# Patient Record
Sex: Female | Born: 1940 | Race: White | Hispanic: No | Marital: Married | Smoking: Never smoker
Health system: Southern US, Community
[De-identification: ages and names within clinical notes are randomized; demographics above are authoritative.]

## PROBLEM LIST (undated history)

## (undated) DIAGNOSIS — M159 Polyosteoarthritis, unspecified: Secondary | ICD-10-CM

## (undated) DIAGNOSIS — M810 Age-related osteoporosis without current pathological fracture: Secondary | ICD-10-CM

## (undated) DIAGNOSIS — D6481 Anemia due to antineoplastic chemotherapy: Secondary | ICD-10-CM

## (undated) DIAGNOSIS — I10 Essential (primary) hypertension: Secondary | ICD-10-CM

## (undated) DIAGNOSIS — G43909 Migraine, unspecified, not intractable, without status migrainosus: Secondary | ICD-10-CM

## (undated) DIAGNOSIS — I639 Cerebral infarction, unspecified: Secondary | ICD-10-CM

## (undated) DIAGNOSIS — E039 Hypothyroidism, unspecified: Secondary | ICD-10-CM

## (undated) DIAGNOSIS — K8020 Calculus of gallbladder without cholecystitis without obstruction: Secondary | ICD-10-CM

## (undated) DIAGNOSIS — F419 Anxiety disorder, unspecified: Secondary | ICD-10-CM

## (undated) DIAGNOSIS — K219 Gastro-esophageal reflux disease without esophagitis: Secondary | ICD-10-CM

## (undated) DIAGNOSIS — C801 Malignant (primary) neoplasm, unspecified: Secondary | ICD-10-CM

## (undated) DIAGNOSIS — D649 Anemia, unspecified: Secondary | ICD-10-CM

## (undated) DIAGNOSIS — E119 Type 2 diabetes mellitus without complications: Secondary | ICD-10-CM

## (undated) DIAGNOSIS — T451X5A Adverse effect of antineoplastic and immunosuppressive drugs, initial encounter: Secondary | ICD-10-CM

## (undated) DIAGNOSIS — G459 Transient cerebral ischemic attack, unspecified: Secondary | ICD-10-CM

## (undated) DIAGNOSIS — E7800 Pure hypercholesterolemia, unspecified: Secondary | ICD-10-CM

## (undated) HISTORY — PX: EYE SURGERY: SHX253

## (undated) HISTORY — PX: RETINAL DETACHMENT SURGERY: SHX105

## (undated) HISTORY — PX: TONSILLECTOMY: SUR1361

## (undated) HISTORY — PX: CATARACT EXTRACTION: SUR2

## (undated) HISTORY — PX: DILATION AND CURETTAGE OF UTERUS: SHX78

## (undated) HISTORY — PX: BREAST BIOPSY: SHX20

---

## 2004-02-27 ENCOUNTER — Other Ambulatory Visit: Payer: Self-pay

## 2005-05-31 ENCOUNTER — Ambulatory Visit: Payer: Self-pay | Admitting: Unknown Physician Specialty

## 2005-07-19 ENCOUNTER — Ambulatory Visit: Payer: Self-pay | Admitting: Unknown Physician Specialty

## 2006-07-11 ENCOUNTER — Emergency Department: Payer: Self-pay | Admitting: Unknown Physician Specialty

## 2006-07-11 ENCOUNTER — Other Ambulatory Visit: Payer: Self-pay

## 2008-07-01 ENCOUNTER — Ambulatory Visit: Payer: Self-pay

## 2009-01-07 ENCOUNTER — Ambulatory Visit: Payer: Self-pay | Admitting: Internal Medicine

## 2009-06-28 ENCOUNTER — Ambulatory Visit: Payer: Self-pay

## 2009-07-11 ENCOUNTER — Ambulatory Visit: Payer: Self-pay

## 2010-08-31 ENCOUNTER — Ambulatory Visit: Payer: Self-pay | Admitting: Physician Assistant

## 2010-10-03 ENCOUNTER — Ambulatory Visit: Payer: Self-pay | Admitting: Unknown Physician Specialty

## 2010-10-05 LAB — PATHOLOGY REPORT

## 2011-09-20 ENCOUNTER — Ambulatory Visit: Payer: Self-pay | Admitting: *Deleted

## 2011-11-13 ENCOUNTER — Ambulatory Visit: Payer: Self-pay | Admitting: Ophthalmology

## 2011-11-21 ENCOUNTER — Ambulatory Visit: Payer: Self-pay | Admitting: Ophthalmology

## 2012-04-05 ENCOUNTER — Ambulatory Visit: Payer: Self-pay | Admitting: Internal Medicine

## 2012-09-23 ENCOUNTER — Ambulatory Visit: Payer: Self-pay | Admitting: Family Medicine

## 2012-10-01 ENCOUNTER — Ambulatory Visit: Payer: Self-pay | Admitting: Ophthalmology

## 2013-09-30 ENCOUNTER — Ambulatory Visit: Payer: Self-pay | Admitting: Family Medicine

## 2014-05-21 ENCOUNTER — Ambulatory Visit: Payer: Self-pay | Admitting: Unknown Physician Specialty

## 2014-06-11 ENCOUNTER — Ambulatory Visit: Payer: Self-pay | Admitting: Unknown Physician Specialty

## 2014-10-05 ENCOUNTER — Ambulatory Visit: Payer: Self-pay | Admitting: Family Medicine

## 2014-11-21 NOTE — Op Note (Signed)
PATIENT NAME:  Erica Levy, Erica Levy MR#:  [id number] DATE OF BIRTH:  08/08/1940  DATE OF PROCEDURE:  11/21/2011  PROCEDURES PERFORMED:  1. Pars plana vitrectomy of the right eye.  2. Internal limiting membrane peel of the right eye.  3. Gas exchange of the right eye.   PREOPERATIVE DIAGNOSIS: Macular hole.   POSTOPERATIVE DIAGNOSIS: Macular hole.   ESTIMATED BLOOD LOSS: Less than 1 mL.   PRIMARY SURGEON: Teresa Pelton. Naw Lasala, MD    ANESTHESIA: Retrobulbar block of the right eye with monitored anesthesia care.   COMPLICATIONS: None.   INDICATIONS FOR PROCEDURE: This is a patient who presented to my office with decreasing vision in the right eye. 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 Risks, benefits, and alternatives of the above procedure were discussed and the patient wished to proceed.   DETAILS OF PROCEDURE: After informed consent was obtained, the patient was brought into the operative suite at [facility]. The patient was placed in supine position and was given a small dose of propofol and a retrobulbar block was performed on the right eye by the primary surgeon without any complications. The right eye was prepped and draped in sterile manner. After lid speculum was inserted, a 25-gauge trocar was placed inferotemporally through displaced conjunctiva in an oblique fashion 4 mm beyond the limbus. Infusion cannula was turned on and inserted through the trocar and secured in position with Steri-Strips. Two more trocars were placed in a similar fashion superotemporally and superonasally. The vitreous cutter and light pipe were introduced into the eye and a core vitrectomy was performed. The vitreous face was elevated off of the retina and the peripheral vitreous was trimmed for 360 degrees. Indocyanine green was injected onto the posterior pole and removed within 30 seconds. An internal limiting membrane peel was performed for 360 degrees around the  fovea for a total diameter of two disk diameters. A scleral depressed exam was performed for 360 degrees and no signs of any breaks, tears, or retinal detachment could be identified. A complete air-fluid exchange was performed. Four minutes was allowed to elapse for dehydration of the vitreous base. This remnant fluid was removed. 22% SF6 was used as an Acupuncturist. All the trocars were removed and the wounds were noted to be airtight. The eye was confirmed to be approximately 15 mmHg. 5 mg of dexamethasone was given into the inferior fornix and the lid speculum was removed. The eye was cleaned and TobraDex was placed in the eye. A patch and shield was placed over the eye and the patient was taken to postanesthesia care with instructions to remain face down.    ____________________________ Teresa Pelton. Starling Manns, MD [id number] D: 11/21/2011 08:01:52 ET T: 11/21/2011 09:26:52 ET JOB#: [id number]  cc: Teresa Pelton. Starling Manns, MD, <Dictator> Coralee Rud MD ELECTRONICALLY SIGNED 12/05/2011 7:10

## 2014-11-22 LAB — SURGICAL PATHOLOGY

## 2015-02-09 ENCOUNTER — Inpatient Hospital Stay: Payer: Medicare Other

## 2015-02-09 ENCOUNTER — Ambulatory Visit (INDEPENDENT_AMBULATORY_CARE_PROVIDER_SITE_OTHER)
Admission: EM | Admit: 2015-02-09 | Discharge: 2015-02-09 | Disposition: A | Discharge status: Home / Self Care | Payer: Medicare Other | Source: Home / Self Care | Attending: Family Medicine | Admitting: Family Medicine

## 2015-02-09 ENCOUNTER — Inpatient Hospital Stay
Admission: EM | Admit: 2015-02-09 | Discharge: 2015-02-10 | DRG: 066 | Disposition: A | Discharge status: Home / Self Care | Payer: Medicare Other | Attending: Internal Medicine | Admitting: Internal Medicine

## 2015-02-09 ENCOUNTER — Encounter: Payer: Self-pay | Admitting: Registered Nurse

## 2015-02-09 ENCOUNTER — Inpatient Hospital Stay (HOSPITAL_COMMUNITY)
Admit: 2015-02-09 | Discharge: 2015-02-09 | Disposition: A | Discharge status: Home / Self Care | Payer: Medicare Other | Attending: Internal Medicine | Admitting: Internal Medicine

## 2015-02-09 ENCOUNTER — Emergency Department: Payer: Medicare Other

## 2015-02-09 ENCOUNTER — Encounter: Payer: Self-pay | Admitting: Emergency Medicine

## 2015-02-09 DIAGNOSIS — I639 Cerebral infarction, unspecified: Secondary | ICD-10-CM

## 2015-02-09 DIAGNOSIS — H539 Unspecified visual disturbance: Secondary | ICD-10-CM | POA: Diagnosis not present

## 2015-02-09 DIAGNOSIS — E785 Hyperlipidemia, unspecified: Secondary | ICD-10-CM | POA: Diagnosis present

## 2015-02-09 DIAGNOSIS — I959 Hypotension, unspecified: Secondary | ICD-10-CM | POA: Diagnosis present

## 2015-02-09 DIAGNOSIS — E039 Hypothyroidism, unspecified: Secondary | ICD-10-CM | POA: Diagnosis present

## 2015-02-09 DIAGNOSIS — Z79899 Other long term (current) drug therapy: Secondary | ICD-10-CM

## 2015-02-09 DIAGNOSIS — G839 Paralytic syndrome, unspecified: Secondary | ICD-10-CM

## 2015-02-09 DIAGNOSIS — M6289 Other specified disorders of muscle: Secondary | ICD-10-CM

## 2015-02-09 DIAGNOSIS — K219 Gastro-esophageal reflux disease without esophagitis: Secondary | ICD-10-CM | POA: Diagnosis present

## 2015-02-09 DIAGNOSIS — Z9849 Cataract extraction status, unspecified eye: Secondary | ICD-10-CM | POA: Diagnosis not present

## 2015-02-09 DIAGNOSIS — I10 Essential (primary) hypertension: Secondary | ICD-10-CM | POA: Diagnosis present

## 2015-02-09 DIAGNOSIS — E780 Pure hypercholesterolemia: Secondary | ICD-10-CM | POA: Diagnosis present

## 2015-02-09 DIAGNOSIS — Z8249 Family history of ischemic heart disease and other diseases of the circulatory system: Secondary | ICD-10-CM | POA: Diagnosis not present

## 2015-02-09 DIAGNOSIS — R279 Unspecified lack of coordination: Secondary | ICD-10-CM | POA: Diagnosis present

## 2015-02-09 DIAGNOSIS — R29898 Other symptoms and signs involving the musculoskeletal system: Secondary | ICD-10-CM

## 2015-02-09 DIAGNOSIS — Z9889 Other specified postprocedural states: Secondary | ICD-10-CM

## 2015-02-09 DIAGNOSIS — I63411 Cerebral infarction due to embolism of right middle cerebral artery: Secondary | ICD-10-CM | POA: Diagnosis not present

## 2015-02-09 DIAGNOSIS — E119 Type 2 diabetes mellitus without complications: Secondary | ICD-10-CM | POA: Diagnosis present

## 2015-02-09 DIAGNOSIS — Z820 Family history of epilepsy and other diseases of the nervous system: Secondary | ICD-10-CM | POA: Diagnosis not present

## 2015-02-09 DIAGNOSIS — M159 Polyosteoarthritis, unspecified: Secondary | ICD-10-CM | POA: Diagnosis present

## 2015-02-09 DIAGNOSIS — G43909 Migraine, unspecified, not intractable, without status migrainosus: Secondary | ICD-10-CM | POA: Diagnosis present

## 2015-02-09 DIAGNOSIS — M810 Age-related osteoporosis without current pathological fracture: Secondary | ICD-10-CM | POA: Diagnosis present

## 2015-02-09 HISTORY — DX: Gastro-esophageal reflux disease without esophagitis: K21.9

## 2015-02-09 HISTORY — DX: Age-related osteoporosis without current pathological fracture: M81.0

## 2015-02-09 HISTORY — DX: Pure hypercholesterolemia, unspecified: E78.00

## 2015-02-09 HISTORY — DX: Hypothyroidism, unspecified: E03.9

## 2015-02-09 HISTORY — DX: Essential (primary) hypertension: I10

## 2015-02-09 HISTORY — DX: Polyosteoarthritis, unspecified: M15.9

## 2015-02-09 HISTORY — DX: Migraine, unspecified, not intractable, without status migrainosus: G43.909

## 2015-02-09 LAB — BASIC METABOLIC PANEL
Anion gap: 4 — ABNORMAL LOW (ref 5–15)
BUN: 28 mg/dL — ABNORMAL HIGH (ref 6–20)
CALCIUM: 9 mg/dL (ref 8.9–10.3)
CHLORIDE: 107 mmol/L (ref 101–111)
CO2: 27 mmol/L (ref 22–32)
CREATININE: 1.09 mg/dL — AB (ref 0.44–1.00)
GFR calc Af Amer: 57 mL/min — ABNORMAL LOW (ref >60)
GFR, EST NON AFRICAN AMERICAN: 49 mL/min — AB (ref >60)
GLUCOSE: 143 mg/dL — AB (ref 65–99)
Potassium: 4 mmol/L (ref 3.5–5.1)
Sodium: 138 mmol/L (ref 135–145)

## 2015-02-09 LAB — CBC
HEMATOCRIT: 40.4 % (ref 35.0–47.0)
Hemoglobin: 14 g/dL (ref 12.0–16.0)
MCH: 31.1 pg (ref 26.0–34.0)
MCHC: 34.8 g/dL (ref 32.0–36.0)
MCV: 89.5 fL (ref 80.0–100.0)
PLATELETS: 214 10*3/uL (ref 150–440)
RBC: 4.51 MIL/uL (ref 3.80–5.20)
RDW: 13.1 % (ref 11.5–14.5)
WBC: 5.4 10*3/uL (ref 3.6–11.0)

## 2015-02-09 LAB — APTT: aPTT: 30 seconds (ref 24–36)

## 2015-02-09 LAB — PROTIME-INR
INR: 0.9
Prothrombin Time: 12.4 seconds (ref 11.4–15.0)

## 2015-02-09 LAB — HEMOGLOBIN A1C: Hgb A1c MFr Bld: 5.4 % (ref 4.0–6.0)

## 2015-02-09 LAB — TROPONIN I: Troponin I: <0.03 ng/mL (ref <0.031)

## 2015-02-09 LAB — TSH: TSH: 2.346 u[IU]/mL (ref 0.350–4.500)

## 2015-02-09 MED ORDER — HYDROCHLOROTHIAZIDE 25 MG PO TABS
25.0000 mg | ORAL_TABLET | Freq: Every morning | ORAL | Status: DC
  Administered 2015-02-09 – 2015-02-10 (×2): 25 mg via ORAL
  Filled 2015-02-09 (×3): qty 1

## 2015-02-09 MED ORDER — ENOXAPARIN SODIUM 40 MG/0.4ML ~~LOC~~ SOLN
40.0000 mg | SUBCUTANEOUS | Status: DC
  Administered 2015-02-09 – 2015-02-10 (×2): 40 mg via SUBCUTANEOUS
  Filled 2015-02-09 (×2): qty 0.4

## 2015-02-09 MED ORDER — ACETAMINOPHEN 650 MG RE SUPP
650.0000 mg | Freq: Four times a day (QID) | RECTAL | Status: DC | PRN
Start: 2015-02-09 — End: 2015-02-10

## 2015-02-09 MED ORDER — ACETAMINOPHEN 325 MG PO TABS
650.0000 mg | ORAL_TABLET | Freq: Four times a day (QID) | ORAL | Status: DC | PRN
  Administered 2015-02-09: 650 mg via ORAL
  Filled 2015-02-09: qty 2

## 2015-02-09 MED ORDER — VITAMIN D (ERGOCALCIFEROL) 1.25 MG (50000 UNIT) PO CAPS
50000.0000 [IU] | ORAL_CAPSULE | ORAL | Status: DC
  Administered 2015-02-09: 50000 [IU] via ORAL
  Filled 2015-02-09: qty 1

## 2015-02-09 MED ORDER — ASPIRIN 81 MG PO CHEW
324.0000 mg | CHEWABLE_TABLET | Freq: Once | ORAL | Status: AC
  Administered 2015-02-09: 324 mg via ORAL
  Filled 2015-02-09: qty 4

## 2015-02-09 MED ORDER — ONDANSETRON HCL 4 MG PO TABS: 4.0000 mg | ORAL_TABLET | Freq: Four times a day (QID) | ORAL | Status: DC | PRN

## 2015-02-09 MED ORDER — LISINOPRIL 20 MG PO TABS
20.0000 mg | ORAL_TABLET | Freq: Every morning | ORAL | Status: DC
  Administered 2015-02-09 – 2015-02-10 (×2): 20 mg via ORAL
  Filled 2015-02-09 (×2): qty 1

## 2015-02-09 MED ORDER — ASPIRIN EC 81 MG PO TBEC
81.0000 mg | DELAYED_RELEASE_TABLET | Freq: Every day | ORAL | Status: DC
  Administered 2015-02-10: 08:00:00 81 mg via ORAL
  Filled 2015-02-09: qty 1

## 2015-02-09 MED ORDER — LEVOTHYROXINE SODIUM 50 MCG PO TABS
50.0000 ug | ORAL_TABLET | Freq: Every day | ORAL | Status: DC
  Administered 2015-02-10: 08:00:00 50 ug via ORAL
  Filled 2015-02-09: qty 1

## 2015-02-09 MED ORDER — SODIUM CHLORIDE 0.9 % IJ SOLN
3.0000 mL | Freq: Two times a day (BID) | INTRAMUSCULAR | Status: DC
  Administered 2015-02-09 – 2015-02-10 (×3): 3 mL via INTRAVENOUS

## 2015-02-09 MED ORDER — ONDANSETRON HCL 4 MG/2ML IJ SOLN: 4.0000 mg | Freq: Four times a day (QID) | INTRAMUSCULAR | Status: DC | PRN

## 2015-02-09 MED ORDER — PRAVASTATIN SODIUM 20 MG PO TABS
80.0000 mg | ORAL_TABLET | Freq: Every day | ORAL | Status: DC
  Administered 2015-02-09 – 2015-02-10 (×2): 80 mg via ORAL
  Filled 2015-02-09 (×2): qty 4

## 2015-02-09 NOTE — Plan of Care (Signed)
Problem: Discharge/Transitional Outcomes Goal: Educational Plan Complete Pt admitted from ED-from home with husband Left hand tingling at 0700 7/13 Hx of HTN, high cholesterol, diabetes controlled with home meds

## 2015-02-09 NOTE — Discharge Instructions (Signed)
Visual Disturbances You have had a disturbance in your vision. This may be caused by various conditions, such as:  Migraines. Migraine headaches are often preceded by a disturbance in vision. Blind spots or light flashes are followed by a headache. This type of visual disturbance is temporary. It does not damage the eye.  Glaucoma. This is caused by increased pressure in the eye. Symptoms include haziness, blurred vision, or seeing rainbow colored circles when looking at bright lights. Partial or complete visual loss can occur. You may or may not experience eye pain. Visual loss may be gradual or sudden and is irreversible. Glaucoma is the leading cause of blindness.  Retina problems. Vision will be reduced if the retina becomes detached or if there is a circulation problem as with diabetes, high blood pressure, or a mini-stroke. Symptoms include seeing "floaters," flashes of light, or shadows, as if a curtain has fallen over your eye.  Optic nerve problems. The main nerve in your eye can be damaged by redness, soreness, and swelling (inflammation), poor circulation, drugs, and toxins. It is very important to have a complete exam done by a specialist to determine the exact cause of your eye problem. The specialist may recommend medicines or surgery, depending on the cause of the problem. This can help prevent further loss of vision or reduce the risk of having a stroke. Contact the caregiver to whom you have been referred and arrange for follow-up care right away. SEEK IMMEDIATE MEDICAL CARE IF:   Your vision gets worse.  You develop severe headaches.  You have any weakness or numbness in the face, arms, or legs.  You have any trouble speaking or walking. Document Released: 08/23/2004 Document Revised: 10/08/2011 Document Reviewed: 12/14/2009 [facility] Patient Information 2015 [place], [place]. This information is not intended to replace advice given to you by your health care provider. Make sure  you discuss any questions you have with your health care provider. Migraine Headache A migraine headache is an intense, throbbing pain on one or both sides of your head. A migraine can last for 30 minutes to several hours. CAUSES  The exact cause of a migraine headache is not always known. However, a migraine may be caused when nerves in the brain become irritated and release chemicals that cause inflammation. This causes pain. Certain things may also trigger migraines, such as:  Alcohol.  Smoking.  Stress.  Menstruation.  Aged cheeses.  Foods or drinks that contain nitrates, glutamate, aspartame, or tyramine.  Lack of sleep.  Chocolate.  Caffeine.  Hunger.  Physical exertion.  Fatigue.  Medicines used to treat chest pain (nitroglycerine), birth control pills, estrogen, and some blood pressure medicines. SIGNS AND SYMPTOMS  Pain on one or both sides of your head.  Pulsating or throbbing pain.  Severe pain that prevents daily activities.  Pain that is aggravated by any physical activity.  Nausea, vomiting, or both.  Dizziness.  Pain with exposure to bright lights, loud noises, or activity.  General sensitivity to bright lights, loud noises, or smells. Before you get a migraine, you may get warning signs that a migraine is coming (aura). An aura may include:  Seeing flashing lights.  Seeing bright spots, halos, or zigzag lines.  Having tunnel vision or blurred vision.  Having feelings of numbness or tingling.  Having trouble talking.  Having muscle weakness. DIAGNOSIS  A migraine headache is often diagnosed based on:  Symptoms.  Physical exam.  A CT scan or MRI of your head. These imaging tests  cannot diagnose migraines, but they can help rule out other causes of headaches. TREATMENT Medicines may be given for pain and nausea. Medicines can also be given to help prevent recurrent migraines.  HOME CARE INSTRUCTIONS  Only take over-the-counter or  prescription medicines for pain or discomfort as directed by your health care provider. The use of long-term narcotics is not recommended.  Lie down in a dark, quiet room when you have a migraine.  Keep a journal to find out what may trigger your migraine headaches. For example, write down:  What you eat and drink.  How much sleep you get.  Any change to your diet or medicines.  Limit alcohol consumption.  Quit smoking if you smoke.  Get 7-9 hours of sleep, or as recommended by your health care provider.  Limit stress.  Keep lights dim if bright lights bother you and make your migraines worse. SEEK IMMEDIATE MEDICAL CARE IF:   Your migraine becomes severe.  You have a fever.  You have a stiff neck.  You have vision loss.  You have muscular weakness or loss of muscle control.  You start losing your balance or have trouble walking.  You feel faint or pass out.  You have severe symptoms that are different from your first symptoms. MAKE SURE YOU:   Understand these instructions.  Will watch your condition.  Will get help right away if you are not doing well or get worse. Document Released: 07/16/2005 Document Revised: 11/30/2013 Document Reviewed: 03/23/2013 [facility] Patient Information 2015 [place], [place]. This information is not intended to replace advice given to you by your health care provider. Make sure you discuss any questions you have with your health care provider. Ischemic Stroke A stroke (cerebrovascular accident) is the sudden death of brain tissue. It is a medical emergency. A stroke can cause permanent loss of brain function. This can cause problems with different parts of your body. A transient ischemic attack (TIA) is different because it does not cause permanent damage. A TIA is a short-lived problem of poor blood flow affecting a part of the brain. A TIA is also a serious problem because having a TIA greatly increases the chances of having a stroke.  When symptoms first develop, you cannot know if the problem might be a stroke or a TIA. CAUSES  A stroke is caused by a decrease of oxygen supply to an area of your brain. It is usually the result of a small blood clot or collection of cholesterol or fat (plaque) that blocks blood flow in the brain. A stroke can also be caused by blocked or damaged carotid arteries.  RISK FACTORS  High blood pressure (hypertension).  High cholesterol.  Diabetes mellitus.  Heart disease.  The buildup of plaque in the blood vessels (peripheral artery disease or atherosclerosis).  The buildup of plaque in the blood vessels providing blood and oxygen to the brain (carotid artery stenosis).  An abnormal heart rhythm (atrial fibrillation).  Obesity.  Smoking.  Taking oral contraceptives (especially in combination with smoking).  Physical inactivity.  A diet high in fats, salt (sodium), and calories.  Alcohol use.  Use of illegal drugs (especially cocaine and methamphetamine).  Being African American.  Being over the age of 66.  Family history of stroke.  Previous history of blood clots, stroke, TIA, or heart attack.  Sickle cell disease. SYMPTOMS  These symptoms usually develop suddenly, or may be newly present upon awakening from sleep:  Sudden weakness or numbness of the face,  arm, or leg, especially on one side of the body.  Sudden trouble walking or difficulty moving arms or legs.  Sudden confusion.  Sudden personality changes.  Trouble speaking (aphasia) or understanding.  Difficulty swallowing.  Sudden trouble seeing in one or both eyes.  Double vision.  Dizziness.  Loss of balance or coordination.  Sudden severe headache with no known cause.  Trouble reading or writing. DIAGNOSIS  Your health care provider can often determine the presence or absence of a stroke based on your symptoms, history, and physical exam. Computed tomography (CT) of the brain is usually  performed to confirm the stroke, determine causes, and determine stroke severity. Other tests may be done to find the cause of the stroke. These tests may include:  Electrocardiography.  Continuous heart monitoring.  Echocardiography.  Carotid ultrasonography.  Magnetic resonance imaging (MRI).  A scan of the brain circulation.  Blood tests. PREVENTION  The risk of a stroke can be decreased by appropriately treating high blood pressure, high cholesterol, diabetes, heart disease, and obesity and by quitting smoking, limiting alcohol, and staying physically active. TREATMENT  Time is of the essence. It is important to seek treatment at the first sign of these symptoms because you may receive a medicine to dissolve the clot (thrombolytic) that cannot be given if too much time has passed since your symptoms began. Even if you do not know when your symptoms began, get treatment as soon as possible as there are other treatment options available including oxygen, intravenous (IV) fluids, and medicines to thin the blood (anticoagulants). Treatment of stroke depends on the duration, severity, and cause of your symptoms. Medicines and dietary changes may be used to address diabetes, high blood pressure, and other risk factors. Physical, speech, and occupational therapists will assess you and work with you to improve any functions impaired by the stroke. Measures will be taken to prevent short-term and long-term complications, including infection from breathing foreign material into the lungs (aspiration pneumonia), blood clots in the legs, bedsores, and falls. Rarely, surgery may be needed to remove large blood clots or to open up blocked arteries. HOME CARE INSTRUCTIONS   Take medicines only as directed by your health care provider. Follow the directions carefully. Medicines may be used to control risk factors for a stroke. Be sure you understand all your medicine instructions.  You may be told to take  a medicine to thin the blood, such as aspirin or the anticoagulant warfarin. Warfarin needs to be taken exactly as instructed.  Too much and too little warfarin are both dangerous. Too much warfarin increases the risk of bleeding. Too little warfarin continues to allow the risk for blood clots. While taking warfarin, you will need to have regular blood tests to measure your blood clotting time. These blood tests usually include both the PT and INR tests. The PT and INR results allow your health care provider to adjust your dose of warfarin. The dose can change for many reasons. It is critically important that you take warfarin exactly as prescribed, and that you have your PT and INR levels drawn exactly as directed.  Many foods, especially foods high in vitamin K, can interfere with warfarin and affect the PT and INR results. Foods high in vitamin K include spinach, kale, broccoli, cabbage, collard and turnip greens, brussels sprouts, peas, cauliflower, seaweed, and parsley, as well as beef and pork liver, green tea, and soybean oil. You should eat a consistent amount of foods high in vitamin K. Avoid  major changes in your diet, or notify your health care provider before changing your diet. Arrange a visit with a dietitian to answer your questions.  Many medicines can interfere with warfarin and affect the PT and INR results. You must tell your health care provider about any and all medicines you take. This includes all vitamins and supplements. Be especially cautious with aspirin and anti-inflammatory medicines. Do not take or discontinue any prescribed or over-the-counter medicine except on the advice of your health care provider or pharmacist.  Warfarin can have side effects, such as excessive bruising or bleeding. You will need to hold pressure over cuts for longer than usual. Your health care provider or pharmacist will discuss other potential side effects.  Avoid sports or activities that may cause  injury or bleeding.  Be mindful when shaving, flossing your teeth, or handling sharp objects.  Alcohol can change the body's ability to handle warfarin. It is best to avoid alcoholic drinks or consume only very small amounts while taking warfarin. Notify your health care provider if you change your alcohol intake.  Notify your dentist or other health care providers before procedures.  If swallow studies have determined that your swallowing reflex is present, you should eat healthy foods. Including 5 or more servings of fruits and vegetables a day may reduce the risk of stroke. Foods may need to be a certain consistency (soft or pureed), or small bites may need to be taken in order to avoid aspirating or choking. Certain dietary changes may be advised to address high blood pressure, high cholesterol, diabetes, or obesity.  Food choices that are low in sodium, saturated fat, trans fat, and cholesterol are recommended to manage high blood pressure.  Food choies that are high in fiber, and low in saturated fat, trans fat, and cholesterol may control cholesterol levels.  Controlling carbohydrates and sugar intake is recommended to manage diabetes.  Reducing calorie intake and making food choices that are low in sodium, saturated fat, trans fat, and cholesterol are recommended to manage obesity.  Maintain a healthy weight.  Stay physically active. It is recommended that you get at least 30 minutes of activity on all or most days.  Do not use any tobacco products including cigarettes, chewing tobacco, or electronic cigarettes.  Limit alcohol use even if you are not taking warfarin. Moderate alcohol use is considered to be:  No more than 2 drinks each day for men.  No more than 1 drink each day for nonpregnant women.  Home safety. A safe home environment is important to reduce the risk of falls. Your health care provider may arrange for specialists to evaluate your home. Having grab bars in the  bedroom and bathroom is often important. Your health care provider may arrange for equipment to be used at home, such as raised toilets and a seat for the shower.  Physical, occupational, and speech therapy. Ongoing therapy may be needed to maximize your recovery after a stroke. If you have been advised to use a walker or a cane, use it at all times. Be sure to keep your therapy appointments.  Follow all instructions for follow-up with your health care provider. This is very important. This includes any referrals, physical therapy, rehabilitation, and lab tests. Proper follow-up can prevent another stroke from occurring. SEEK MEDICAL CARE IF:  You have personality changes.  You have difficulty swallowing.  You are seeing double.  You have dizziness.  You have a fever.  You have skin breakdown. SEEK IMMEDIATE  MEDICAL CARE IF:  Any of these symptoms may represent a serious problem that is an emergency. Do not wait to see if the symptoms will go away. Get medical help right away. Call your local emergency services (911 in [place] Do not drive yourself to the hospital.  You have sudden weakness or numbness of the face, arm, or leg, especially on one side of the body.  You have sudden trouble walking or difficulty moving arms or legs.  You have sudden confusion.  You have trouble speaking (aphasia) or understanding.  You have sudden trouble seeing in one or both eyes.  You have a loss of balance or coordination.  You have a sudden, severe headache with no known cause.  You have new chest pain or an irregular heartbeat.  You have a partial or total loss of consciousness. Document Released: 07/16/2005 Document Revised: 11/30/2013 Document Reviewed: 02/24/2012 [facility] Patient Information 2015 [place], [place]. This information is not intended to replace advice given to you by your health care provider. Make sure you discuss any questions you have with your health care  provider. Transient Ischemic Attack A transient ischemic attack (TIA) is a "warning stroke" that causes stroke-like symptoms. Unlike a stroke, a TIA does not cause permanent damage to the brain. The symptoms of a TIA can happen very fast and do not last long. It is important to know the symptoms of a TIA and what to do. This can help prevent a major stroke or death. CAUSES   A TIA is caused by a temporary blockage in an artery in the brain or neck (carotid artery). The blockage does not allow the brain to get the blood supply it needs and can cause different symptoms. The blockage can be caused by either:  A blood clot.  Fatty buildup (plaque) in a neck or brain artery. RISK FACTORS  High blood pressure (hypertension).  High cholesterol.  Diabetes mellitus.  Heart disease.  The build up of plaque in the blood vessels (peripheral artery disease or atherosclerosis).  The build up of plaque in the blood vessels providing blood and oxygen to the brain (carotid artery stenosis).  An abnormal heart rhythm (atrial fibrillation).  Obesity.  Smoking.  Taking oral contraceptives (especially in combination with smoking).  Physical inactivity.  A diet high in fats, salt (sodium), and calories.  Alcohol use.  Use of illegal drugs (especially cocaine and methamphetamine).  Being female.  Being African American.  Being over the age of 32.  Family history of stroke.  Previous history of blood clots, stroke, TIA, or heart attack.  Sickle cell disease. SYMPTOMS  TIA symptoms are the same as a stroke but are temporary. These symptoms usually develop suddenly, or may be newly present upon awakening from sleep:  Sudden weakness or numbness of the face, arm, or leg, especially on one side of the body.  Sudden trouble walking or difficulty moving arms or legs.  Sudden confusion.  Sudden personality changes.  Trouble speaking (aphasia) or understanding.  Difficulty  swallowing.  Sudden trouble seeing in one or both eyes.  Double vision.  Dizziness.  Loss of balance or coordination.  Sudden severe headache with no known cause.  Trouble reading or writing.  Loss of bowel or bladder control.  Loss of consciousness. DIAGNOSIS  Your caregiver may be able to determine the presence or absence of a TIA based on your symptoms, history, and physical exam. Computed tomography (CT scan) of the brain is usually performed to help identify a  TIA. Other tests may be done to diagnose a TIA. These tests may include:  Electrocardiography.  Continuous heart monitoring.  Echocardiography.  Carotid ultrasonography.  Magnetic resonance imaging (MRI).  A scan of the brain circulation.  Blood tests. PREVENTION  The risk of a TIA can be decreased by appropriately treating high blood pressure, high cholesterol, diabetes, heart disease, and obesity and by quitting smoking, limiting alcohol, and staying physically active. TREATMENT  Time is of the essence. Since the symptoms of TIA are the same as a stroke, it is important to seek treatment as soon as possible because you may need a medicine to dissolve the clot (thrombolytic) that cannot be given if too much time has passed. Treatment options vary. Treatment options may include rest, oxygen, intravenous (IV) fluids, and medicines to thin the blood (anticoagulants). Medicines and diet may be used to address diabetes, high blood pressure, and other risk factors. Measures will be taken to prevent short-term and long-term complications, including infection from breathing foreign material into the lungs (aspiration pneumonia), blood clots in the legs, and falls. Treatment options include procedures to either remove plaque in the carotid arteries or dilate carotid arteries that have narrowed due to plaque. Those procedures are:  Carotid endarterectomy.  Carotid angioplasty and stenting. HOME CARE INSTRUCTIONS   Take  all medicines prescribed by your caregiver. Follow the directions carefully. Medicines may be used to control risk factors for a stroke. Be sure you understand all your medicine instructions.  You may be told to take aspirin or the anticoagulant warfarin. Warfarin needs to be taken exactly as instructed.  Taking too much or too little warfarin is dangerous. Too much warfarin increases the risk of bleeding. Too little warfarin continues to allow the risk for blood clots. While taking warfarin, you will need to have regular blood tests to measure your blood clotting time. A PT blood test measures how long it takes for blood to clot. Your PT is used to calculate another value called an INR. Your PT and INR help your caregiver to adjust your dose of warfarin. The dose can change for many reasons. It is critically important that you take warfarin exactly as prescribed.  Many foods, especially foods high in vitamin K can interfere with warfarin and affect the PT and INR. Foods high in vitamin K include spinach, kale, broccoli, cabbage, collard and turnip greens, brussels sprouts, peas, cauliflower, seaweed, and parsley as well as beef and pork liver, green tea, and soybean oil. You should eat a consistent amount of foods high in vitamin K. Avoid major changes in your diet, or notify your caregiver before changing your diet. Arrange a visit with a dietitian to answer your questions.  Many medicines can interfere with warfarin and affect the PT and INR. You must tell your caregiver about any and all medicines you take, this includes all vitamins and supplements. Be especially cautious with aspirin and anti-inflammatory medicines. Do not take or discontinue any prescribed or over-the-counter medicine except on the advice of your caregiver or pharmacist.  Warfarin can have side effects, such as excessive bruising or bleeding. You will need to hold pressure over cuts for longer than usual. Your caregiver or  pharmacist will discuss other potential side effects.  Avoid sports or activities that may cause injury or bleeding.  Be mindful when shaving, flossing your teeth, or handling sharp objects.  Alcohol can change the body's ability to handle warfarin. It is best to avoid alcoholic drinks or consume only very  small amounts while taking warfarin. Notify your caregiver if you change your alcohol intake.  Notify your dentist or other caregivers before procedures.  Eat a diet that includes 5 or more servings of fruits and vegetables each day. This may reduce the risk of stroke. Certain diets may be prescribed to address high blood pressure, high cholesterol, diabetes, or obesity.  A low-sodium, low-saturated fat, low-trans fat, low-cholesterol diet is recommended to manage high blood pressure.  A low-saturated fat, low-trans fat, low-cholesterol, and high-fiber diet may control cholesterol levels.  A controlled-carbohydrate, controlled-sugar diet is recommended to manage diabetes.  A reduced-calorie, low-sodium, low-saturated fat, low-trans fat, low-cholesterol diet is recommended to manage obesity.  Maintain a healthy weight.  Stay physically active. It is recommended that you get at least 30 minutes of activity on most or all days.  Do not smoke.  Limit alcohol use even if you are not taking warfarin. Moderate alcohol use is considered to be:  No more than 2 drinks each day for men.  No more than 1 drink each day for nonpregnant women.  Stop drug abuse.  Home safety. A safe home environment is important to reduce the risk of falls. Your caregiver may arrange for specialists to evaluate your home. Having grab bars in the bedroom and bathroom is often important. Your caregiver may arrange for equipment to be used at home, such as raised toilets and a seat for the shower.  Follow all instructions for follow-up with your caregiver. This is very important. This includes any referrals and  lab tests. Proper follow up can prevent a stroke or another TIA from occurring. SEEK MEDICAL CARE IF:  You have personality changes.  You have difficulty swallowing.  You are seeing double.  You have dizziness.  You have a fever.  You have skin breakdown. SEEK IMMEDIATE MEDICAL CARE IF:  Any of these symptoms may represent a serious problem that is an emergency. Do not wait to see if the symptoms will go away. Get medical help right away. Call your local emergency services (911 in [place] Do not drive yourself to the hospital.  You have sudden weakness or numbness of the face, arm, or leg, especially on one side of the body.  You have sudden trouble walking or difficulty moving arms or legs.  You have sudden confusion.  You have trouble speaking (aphasia) or understanding.  You have sudden trouble seeing in one or both eyes.  You have a loss of balance or coordination.  You have a sudden, severe headache with no known cause.  You have new chest pain or an irregular heartbeat.  You have a partial or total loss of consciousness. MAKE SURE YOU:   Understand these instructions.  Will watch your condition.  Will get help right away if you are not doing well or get worse. Document Released: 04/25/2005 Document Revised: 07/21/2013 Document Reviewed: 10/21/2013 [facility] Patient Information 2015 [place], [place]. This information is not intended to replace advice given to you by your health care provider. Make sure you discuss any questions you have with your health care provider.

## 2015-02-09 NOTE — ED Provider Notes (Signed)
[facility] Emergency Department Provider Note  ____________________________________________  Time seen: Approximately 10:25 AM  I have reviewed the triage vital signs and the nursing notes.   HISTORY  Chief Complaint Hand Pain    HPI Erica Levy is a 74 y.o. female history of migraines who presents this morning noticing that she could not move her left hand and having some numbness. She also reported a slight vision change and a moderate headache that last about an hour and is now gone. She does report similar headaches with migraines and occasionally gets numbness in the left hand but is never had weakness in her left arm.  She went to bed about 11 PM last night and woke up with symptoms at 7:30 AM. Symptoms did not start suddenly once she was awake, but she awoke with them.  Denies any chest pain or trouble breathing. No previous history of stroke.  No neck pain.  Past Medical History  Diagnosis Date  . Hypertension   . Hypothyroid   . Hypercholesteremia   . DM (diabetes mellitus)     diet controlled  . Generalized OA   . Osteoporosis   . Migraine   . GERD (gastroesophageal reflux disease)     Patient Active Problem List   Diagnosis Date Noted  . CVA (cerebral infarction) 02/09/2015    Past Surgical History  Procedure Laterality Date  . Tonsillectomy    . Eye surgery    . Cataract extraction    . Retinal detachment surgery      No current outpatient prescriptions on file.  Allergies Review of patient's allergies indicates no known allergies.  Family History  Problem Relation Age of Onset  . Hypertension Mother   . Osteoarthritis Mother   . Dementia Father     Social History History  Substance Use Topics  . Smoking status: Never Smoker   . Smokeless tobacco: Not on file  . Alcohol Use: No    Review of Systems Constitutional: No fever/chills Eyes: No visual changes. ENT: No sore throat. Cardiovascular:  Denies chest pain. Respiratory: Denies shortness of breath. Gastrointestinal: No abdominal pain.  No nausea, no vomiting.  No diarrhea.  No constipation. Genitourinary: Negative for dysuria. Musculoskeletal: Negative for back pain. Skin: Negative for rash. Neurological: Negative for headaches, no weakness in the face, no trouble speaking, no weakness in the legs or trouble walking. 10-point ROS otherwise negative.  ____________________________________________   PHYSICAL EXAM:  VITAL SIGNS: ED Triage Vitals  Enc Vitals Group     BP 02/09/15 0942 115/56 mmHg     Pulse Rate 02/09/15 0942 94     Resp 02/09/15 0942 18     Temp 02/09/15 0942 98.1 F (36.7 C)     Temp Source 02/09/15 0942 Oral     SpO2 02/09/15 0942 99 %     Weight 02/09/15 0942 159 lb (72.122 kg)     Height 02/09/15 0942 5\' 1"  (1.549 m)     Head Cir --      Peak Flow --      Pain Score --      Pain Loc --      Pain Edu? --      Excl. in [place]? --     Constitutional: Alert and oriented. Well appearing and in no acute distress. Eyes: Conjunctivae are normal. PERRL. EOMI. Head: Atraumatic. Nose: No congestion/rhinnorhea. Mouth/Throat: Mucous membranes are moist.  Oropharynx non-erythematous. Neck: No stridor.   Cardiovascular: Normal rate, regular rhythm. Grossly  normal heart sounds.  Good peripheral circulation. Respiratory: Normal respiratory effort.  No retractions. Lungs CTAB. Gastrointestinal: Soft and nontender. No distention. No abdominal bruits. No CVA tenderness. Musculoskeletal: No lower extremity tenderness nor edema.  No joint effusions. Neurologic:  Normal speech and language. No gross focal neurologic deficits are appreciated except in the left hand. No gait instability. Exam is normal. Extraocular movements intact. Visual fields are normal. The left biceps seems slightly weak, the left hand demonstrates weakness being held in a slightly pronated position with what appears to be a slight wrist drop,  however patient is unable to demonstrate a specific deficit based on median ulnar and radial nerve evaluation. She certainly does have 2 out of 5 strength in the left hand and wrist, but again I'm unable to tack down any particular nerve deficit and my concern would be that she may have a more central process. There is no neck pain. She has no cervical tenderness. Skin:  Skin is warm, dry and intact. No rash noted. Psychiatric: Mood and affect are normal. Speech and behavior are normal.  ____________________________________________   LABS (all labs ordered are listed, but only abnormal results are displayed)  Labs Reviewed  BASIC METABOLIC PANEL - Abnormal; Notable for the following:    Glucose, Bld 143 (*)    BUN 28 (*)    Creatinine, Ser 1.09 (*)    GFR calc non Af Amer 49 (*)    GFR calc Af Amer 57 (*)    Anion gap 4 (*)    All other components within normal limits  APTT  CBC  TROPONIN I  PROTIME-INR  TSH  HEMOGLOBIN A1C   ____________________________________________  EKG  ED ECG REPORT I, Taytem Ghattas, the attending physician, personally viewed and interpreted this ECG.  Date: 02/09/2015 EKG Time: 1025 Rate: 80 Rhythm: normal sinus rhythm QRS Axis: normal Intervals: normal ST/T Wave abnormalities: normal Conduction Disutrbances: none Narrative Interpretation: unremarkable  ____________________________________________  RADIOLOGY  CLINICAL DATA: Left arm weakness, started approximately 7 a.m. this morning.  EXAM: CT HEAD WITHOUT CONTRAST  TECHNIQUE: Contiguous axial images were obtained from the base of the skull through the vertex without contrast.  COMPARISON: None  FINDINGS: No evidence for acute hemorrhage, mass lesion, midline shift, hydrocephalus or large infarct. Visualized paranasal sinuses are clear. No acute bone abnormality.  IMPRESSION: No acute intracranial  abnormality. ____________________________________________   PROCEDURES  Procedure(s) performed: None  Critical Care performed: Yes, see critical care note(s)  CRITICAL CARE Performed by: Delman Kitten   Total critical care time: 35  Critical care time was exclusive of separately billable procedures and treating other patients.  Critical care was necessary to treat or prevent imminent or life-threatening deterioration.  Critical care was time spent personally by me on the following activities: development of treatment plan with patient and/or surrogate as well as nursing, discussions with consultants, evaluation of patient's response to treatment, examination of patient, obtaining history from patient or surrogate, ordering and performing treatments and interventions, ordering and review of laboratory studies, ordering and review of radiographic studies, pulse oximetry and re-evaluation of patient's condition.  Patient presents with acute neurologic deficit including left hand weakness with an associated headache. Required immediate evaluation for potential stroke.  To my evaluation her NIH score is equal to 2. However, based on timing and going to bed and awakening with symptoms she is not a TPA candidate. I did discuss with Dr. Irish Elders who recommends obtaining CT imaging and admission for MRI and further neurologic  evaluation with consideration of possible complex migraine, acute stroke, or other acute neurologic deficit/neuropathy. ____________________________________________   INITIAL IMPRESSION / ASSESSMENT AND PLAN / ED COURSE  Pertinent labs & imaging results that were available during my care of the patient were reviewed by me and considered in my medical decision making (see chart for details).  Patient presents with left-sided weakness with an associated headache earlier that has resolved. She still does have ongoing weakness in the left upper extremity. This is suspicious  for possible complex migraine, stroke, or other etiology such as radicular or neuropathy. She is here now stable. We'll admit her to the hospital  and I spoke with Dr. Adela Ports who will see the patient in consultation. ____________________________________________   FINAL CLINICAL IMPRESSION(S) / ED DIAGNOSES  Final diagnoses:  Ischemic stroke with paralysis      Delman Kitten, MD 02/09/15 1557

## 2015-02-09 NOTE — ED Notes (Signed)
Pt presents with not being able to use her left hand this am. Now regaining use of left hand. Denies any other symptoms at this time.

## 2015-02-09 NOTE — ED Notes (Signed)
Pt states sometimes when she gets a headache her left hand will go numb.

## 2015-02-09 NOTE — ED Notes (Signed)
Pt still in MRI, will call report to unit.

## 2015-02-09 NOTE — ED Notes (Signed)
Informed floor that patient will come up once she is finished in radiology.

## 2015-02-09 NOTE — ED Notes (Addendum)
Patient transported to US 

## 2015-02-09 NOTE — ED Notes (Addendum)
Returned from [place], floor to be notified when patient leaving ED.

## 2015-02-09 NOTE — ED Notes (Signed)
Updated Margreta Journey that patient is now in MRI will be transported to the floor once MRI is completed by one of our techs.

## 2015-02-09 NOTE — Progress Notes (Signed)
*  PRELIMINARY RESULTS* Echocardiogram 2D Echocardiogram has been performed.  Erica Levy 02/09/2015, 2:46 PM

## 2015-02-09 NOTE — ED Notes (Signed)
Hx of migraine headaches and Hx of cervical neck problems according to chiropractor. Woke this morning with left arm numbness and unable to make a fist. Limited movement lifting  Arm. "I'm waiting for it to wake up". Left grip weaker than right. States no problems ambulating. ? Slight droop left side of mouth.

## 2015-02-09 NOTE — ED Provider Notes (Signed)
CSN: [id number]     Arrival date & time 02/09/15  0820 History   First MD Initiated Contact with Patient 02/09/15 [phone]     Chief Complaint  Patient presents with  . Numbness   (Consider location/radiation/quality/duration/timing/severity/associated sxs/prior Treatment) HPI Comments: Caucasian retired female woke up this am with inability to use left hand and squiggly lines in vision which have now resolved; denied dysphagia, dysphasia, dyspnea, chest pain, headache.  Patient has not taken her normal medications.  Required help to put on bra/get dressed this am.  Her typical migraines sometimes cause to her have left hand weakness but typically resolves quickly and not this useless for ADLs.  Patient does not have glucometer at home and does not check her blood sugars.  Has been controlling type II diabetes with diet only and told her repeat labs put her in prediabetic range.  Spouse with patient.    The history is provided by the patient and the spouse.    Past Medical History  Diagnosis Date  . Hypertension   . Hypothyroid   . Hypercholesteremia    Past Surgical History  Procedure Laterality Date  . Tonsillectomy     Family History  Problem Relation Age of Onset  . Hypertension Mother   . Osteoarthritis Mother   . Dementia Father    History  Substance Use Topics  . Smoking status: Never Smoker   . Smokeless tobacco: Not on file  . Alcohol Use: No   OB History    No data available     Review of Systems  Constitutional: Negative for fever, chills, diaphoresis, activity change, appetite change and fatigue.  HENT: Negative for congestion, dental problem, drooling, ear discharge, ear pain, facial swelling, hearing loss, mouth sores, nosebleeds, tinnitus, trouble swallowing and voice change.   Eyes: Positive for visual disturbance. Negative for photophobia, pain, discharge, redness and itching.  Respiratory: Negative for cough, shortness of breath, wheezing and stridor.    Cardiovascular: Negative for chest pain, palpitations and leg swelling.  Gastrointestinal: Negative for nausea, vomiting, diarrhea and blood in stool.  Endocrine: Negative for cold intolerance and heat intolerance.  Genitourinary: Negative for dysuria.  Musculoskeletal: Negative for myalgias, back pain, joint swelling, arthralgias and gait problem.  Skin: Negative for color change, pallor, rash and wound.  Allergic/Immunologic: Negative for environmental allergies and food allergies.  Neurological: Positive for weakness and numbness. Negative for dizziness, tremors, seizures, syncope, facial asymmetry, speech difficulty, light-headedness and headaches.  Hematological: Negative for adenopathy.  Psychiatric/Behavioral: Negative for behavioral problems, confusion, sleep disturbance, decreased concentration and agitation.    Allergies  Review of patient's allergies indicates no known allergies.  Home Medications   Prior to Admission medications   Medication Sig Start Date End Date Taking? Authorizing Provider  levothyroxine (SYNTHROID, LEVOTHROID) 125 MCG tablet Take 125 mcg by mouth daily before breakfast.   Yes Historical Provider, MD  lisinopril (PRINIVIL,ZESTRIL) 10 MG tablet Take 12.5 mg by mouth daily.   Yes Historical Provider, MD  phenyltoloxamine-acetaminophen 30-325 MG per tablet Take 1 tablet by mouth every 4 (four) hours as needed for pain.   Yes Historical Provider, MD  pravastatin (PRAVACHOL) 80 MG tablet Take 80 mg by mouth daily.   Yes Historical Provider, MD   BP 158/62 mmHg  Pulse 88  Temp(Src) 97.7 F (36.5 C) (Oral)  Resp 18  Ht 5\' 1"  (1.549 m)  Wt 159 lb (72.122 kg)  BMI 30.06 kg/m2  SpO2 100% Physical Exam  Constitutional: She is oriented to  person, place, and time. Vital signs are normal. She appears well-developed and well-nourished.  Non-toxic appearance. She does not have a sickly appearance. She does not appear ill. No distress.  HENT:  Head: Normocephalic  and atraumatic.  Right Ear: Hearing and external ear normal.  Left Ear: Hearing and external ear normal.  Nose: Nose normal.  Mouth/Throat: Uvula is midline, oropharynx is clear and moist and mucous membranes are normal. She does not have dentures. No oral lesions. No trismus in the jaw. Normal dentition. No dental abscesses, uvula swelling, lacerations or dental caries. No oropharyngeal exudate, posterior oropharyngeal edema or posterior oropharyngeal erythema.  Eyes: Conjunctivae, EOM and lids are normal. Pupils are equal, round, and reactive to light. Right eye exhibits no chemosis, no discharge, no exudate and no hordeolum. No foreign body present in the right eye. Left eye exhibits no chemosis, no discharge, no exudate and no hordeolum. No foreign body present in the left eye. Right conjunctiva is not injected. Right conjunctiva has no hemorrhage. Left conjunctiva is not injected. Left conjunctiva has no hemorrhage. No scleral icterus. Right eye exhibits normal extraocular motion and no nystagmus. Left eye exhibits normal extraocular motion and no nystagmus. Right pupil is round and reactive. Left pupil is round and reactive. Pupils are equal.  Snellen DVA OD 20/30 os 20/40 ou 20/30-1 with corrective lenses  Neck: Trachea normal and normal range of motion. Neck supple. No tracheal deviation present.  Cardiovascular: Normal rate, regular rhythm and intact distal pulses.   Pulmonary/Chest: Effort normal and breath sounds normal. No stridor. No respiratory distress. She has no wheezes.  Abdominal: Soft. She exhibits no distension.  Musculoskeletal: She exhibits no edema or tenderness.       Right shoulder: Normal.       Left shoulder: She exhibits decreased range of motion and decreased strength.       Right elbow: Normal.      Left elbow: Normal.       Right wrist: Normal.       Left wrist: Normal.       Cervical back: Normal.       Right upper arm: Normal.       Left upper arm: Normal.        Right forearm: Normal.       Left forearm: Normal.       Right hand: Normal.       Left hand: She exhibits decreased range of motion. She exhibits no tenderness, no bony tenderness, normal capillary refill, no laceration and no swelling. Decreased strength noted. She exhibits wrist extension trouble. She exhibits no finger abduction and no thumb/finger opposition.  Can abduct to 100 degrees only with great effort denied pain; can curl left fingers but not touch fingers to thumb or palm or perform any sort of grasp with left hand/fingers  Neurological: She is alert and oriented to person, place, and time. She displays no atrophy, no tremor and normal reflexes. A cranial nerve deficit is present. She displays no seizure activity. Coordination abnormal. Gait normal. GCS eye subscore is 4. GCS verbal subscore is 5. GCS motor subscore is 5.  Reflex Scores:      Tricep reflexes are 2+ on the right side and 2+ on the left side.      Bicep reflexes are 2+ on the right side and 2+ on the left side.      Brachioradialis reflexes are 2+ on the right side and 2+ on the left side.  Patellar reflexes are 2+ on the right side and 2+ on the left side.      Achilles reflexes are 2+ on the right side and 2+ on the left side. Left patellar slightly delayed 2-3+/4; left hand unable to grasp fingers into fist and asymmetrical abduction/adduction left arm versus right; left corner lip slightly drooping with smile  Skin: Skin is warm, dry and intact. No rash noted. She is not diaphoretic. No erythema. No pallor.  Psychiatric: She has a normal mood and affect. Her speech is normal and behavior is normal. Judgment and thought content normal. Cognition and memory are normal.  Nursing note and vitals reviewed.   ED Course  Procedures (including critical care time) Labs Review Labs Reviewed - No data to display  Imaging Review No results found.   MDM   1. Left hand weakness   2. Visual changes    Report  called to [facility] ED female charge nurse at [phone].  Spouse drove patient to ED.  Discussed signs/symptoms stroke and TIA with patient and spouse. Differential diagnosis: worsening migraine, need for new eyeglass Rx, cervical nerve compression, TIA, stroke. Discussed with patient and spouse to keep headache/symptom log and schedule follow up with [facility] s/p ER evaluation.  Recommended CT scan which is currently not available at [facility].   Follow up immediately for re-evaluation at ER if aphasia, dysphasia, visual changes, weakness, fall, worst headache of life, incoordination, fever, ear discharge. Exitcare handout on migraines, stroke, TIA given to patient and spouse.  Has been longer than one year since last optometry exam due to diabetes, hypertension, hyperlipidemia recommend yearly evaluation.  Discussed with spouse and patient due to overweight, migraines, HTN, HLP patient at higher risk for stroke.  Spouse and Patient verbalized understanding of information/agreed with plan of care and had no further questions at this time.    Olen Cordial, NP 02/09/15 [phone]

## 2015-02-09 NOTE — ED Notes (Signed)
Pt now transported to MRI. MRI unable to transport to room on 1C since the patient is being admitted.

## 2015-02-09 NOTE — H&P (Addendum)
[facility] [place] NAME: Erica Levy    MR#:  [id number]  DATE OF BIRTH:  02-Feb-1941  DATE OF ADMISSION:  02/09/2015  PRIMARY CARE PHYSICIAN: WHITE, [name], NP   REQUESTING/REFERRING PHYSICIAN: Dr. Jacqualine Code. Mark  CHIEF COMPLAINT:   Chief Complaint  Patient presents with  .  left hand weakness     HISTORY OF PRESENT ILLNESS: Erica Levy  is a 74 y.o. female with a known history of migraines, hypertension, hypothyroidism, diet-controlled diabetes presented to the urgent care earlier today with complaint of left hand weakness which she woke up with. Patient reports that she does get some transient left hand weakness with a migrainous but this is worst and continued. And she has not had a headache. She also reported that she was seeing some squiggly lines in both of her eyes which have not improved. Patient came to the emergency room with the symptoms CT scan of the head was negative. She continues to have some left arm and hand weakness  PAST MEDICAL HISTORY:   Past Medical History  Diagnosis Date  . Hypertension   . Hypothyroid   . Hypercholesteremia   . DM (diabetes mellitus)     diet controlled  . Generalized OA   . Osteoporosis   . Migraine   . GERD (gastroesophageal reflux disease)     PAST SURGICAL HISTORY:  Past Surgical History  Procedure Laterality Date  . Tonsillectomy    . Eye surgery    . Cataract extraction    . Retinal detachment surgery      SOCIAL HISTORY:  History  Substance Use Topics  . Smoking status: Never Smoker   . Smokeless tobacco: Not on file  . Alcohol Use: No    FAMILY HISTORY:  Family History  Problem Relation Age of Onset  . Hypertension Mother   . Osteoarthritis Mother   . Dementia Father     DRUG ALLERGIES: No Known Allergies  REVIEW OF SYSTEMS:   CONSTITUTIONAL: No fever, fatigue or weakness.  EYES: No blurred or double vision.  EARS, NOSE, AND THROAT: No  tinnitus or ear pain.  RESPIRATORY: No cough, shortness of breath, wheezing or hemoptysis.  CARDIOVASCULAR: No chest pain, orthopnea, edema.  GASTROINTESTINAL: No nausea, vomiting, diarrhea or abdominal pain.  GENITOURINARY: No dysuria, hematuria.  ENDOCRINE: No polyuria, nocturia,  HEMATOLOGY: No anemia, easy bruising or bleeding SKIN: No rash or lesion. MUSCULOSKELETAL: No joint pain or arthritis.   NEUROLOGIC: Left hand and arm weakness, no numbness tingling  PSYCHIATRY: No anxiety or depression.   MEDICATIONS AT HOME:  Prior to Admission medications   Medication Sig Start Date End Date Taking? Authorizing Provider  hydrochlorothiazide (HYDRODIURIL) 25 MG tablet Take 1 tablet by mouth every morning. 12/22/14  Yes Historical Provider, MD  levothyroxine (SYNTHROID, LEVOTHROID) 125 MCG tablet Take 125 mcg by mouth daily before breakfast.   Yes Historical Provider, MD  levothyroxine (SYNTHROID, LEVOTHROID) 50 MCG tablet Take 1 tablet by mouth every morning. 11/04/14 09/16/15 Yes Historical Provider, MD  lisinopril (PRINIVIL,ZESTRIL) 20 MG tablet Take 1 tablet by mouth every morning. 01/04/15 01/04/16 Yes Historical Provider, MD  pravastatin (PRAVACHOL) 80 MG tablet Take 80 mg by mouth daily.   Yes Historical Provider, MD  pravastatin (PRAVACHOL) 80 MG tablet Take 1 tablet by mouth at bedtime. 09/30/14 09/30/15 Yes Historical Provider, MD  Vitamin D, Ergocalciferol, (DRISDOL) 50000 UNITS CAPS capsule Take 50,000 Units by mouth every 7 (seven) days.  Yes Historical Provider, MD  lisinopril (PRINIVIL,ZESTRIL) 10 MG tablet Take 12.5 mg by mouth daily.    Historical Provider, MD  phenyltoloxamine-acetaminophen 30-325 MG per tablet Take 1 tablet by mouth every 4 (four) hours as needed for pain.    Historical Provider, MD      PHYSICAL EXAMINATION:   VITAL SIGNS: Blood pressure 145/68, pulse 90, temperature 98.1 F (36.7 C), temperature source Oral, resp. rate 18, height 5\' 1"  (1.549 m), weight 72.122 kg  (159 lb), SpO2 98 %.  GENERAL:  74 y.o.-year-old patient lying in the bed with no acute distress.  EYES: Pupils equal, round, reactive to light and accommodation. No scleral icterus. Extraocular muscles intact.  HEENT: Head atraumatic, normocephalic. Oropharynx and nasopharynx clear.  NECK:  Supple, no jugular venous distention. No thyroid enlargement, no tenderness.  LUNGS: Normal breath sounds bilaterally, no wheezing, rales,rhonchi or crepitation. No use of accessory muscles of respiration.  CARDIOVASCULAR: S1, S2 normal. No murmurs, rubs, or gallops.  ABDOMEN: Soft, nontender, nondistended. Bowel sounds present. No organomegaly or mass.  EXTREMITIES: No pedal edema, cyanosis, or clubbing.  NEUROLOGIC: Cranial nerves II through XII are intact. Left upper extremity 4 out of 5 , rest of all extremities strength 5 out of 5  PSYCHIATRIC: The patient is alert and oriented x 3.  SKIN: No obvious rash, lesion, or ulcer.   LABORATORY PANEL:   CBC  Recent Labs Lab 02/09/15 1012  WBC 5.4  HGB 14.0  HCT 40.4  PLT 214  MCV 89.5  MCH 31.1  MCHC 34.8  RDW 13.1   ------------------------------------------------------------------------------------------------------------------  Chemistries   Recent Labs Lab 02/09/15 1012  NA 138  K 4.0  CL 107  CO2 27  GLUCOSE 143*  BUN 28*  CREATININE 1.09*  CALCIUM 9.0   ------------------------------------------------------------------------------------------------------------------ estimated creatinine clearance is 42.3 mL/min (by C-G formula based on Cr of 1.09). ------------------------------------------------------------------------------------------------------------------ No results for input(s): TSH, T4TOTAL, T3FREE, THYROIDAB in the last 72 hours.  Invalid input(s): FREET3   Coagulation profile  Recent Labs Lab 02/09/15 1012  INR 0.90    ------------------------------------------------------------------------------------------------------------------- No results for input(s): DDIMER in the last 72 hours. -------------------------------------------------------------------------------------------------------------------  Cardiac Enzymes  Recent Labs Lab 02/09/15 1012  TROPONINI <0.03   ------------------------------------------------------------------------------------------------------------------ Invalid input(s): POCBNP  ---------------------------------------------------------------------------------------------------------------  Urinalysis No results found for: COLORURINE, APPEARANCEUR, LABSPEC, PHURINE, GLUCOSEU, HGBUR, BILIRUBINUR, KETONESUR, PROTEINUR, UROBILINOGEN, NITRITE, LEUKOCYTESUR   RADIOLOGY: Ct Head Wo Contrast  02/09/2015   CLINICAL DATA:  Left arm weakness, started approximately 7 a.m. this morning.  EXAM: CT HEAD WITHOUT CONTRAST  TECHNIQUE: Contiguous axial images were obtained from the base of the skull through the vertex without contrast.  COMPARISON:  None  FINDINGS: No evidence for acute hemorrhage, mass lesion, midline shift, hydrocephalus or large infarct. Visualized paranasal sinuses are clear. No acute bone abnormality.  IMPRESSION: No acute intracranial abnormality.   Electronically Signed   By: Markus Daft M.D.   On: 02/09/2015 11:00    EKG: Orders placed or performed during the hospital encounter of 02/09/15  . ED EKG  . ED EKG    IMPRESSION AND PLAN: Patient is a 74 year old white female with history of migraines presents with left arm and weakness CT scan of the head negative  1. Left arm and hand weakness: Suspect possible stroke, start patient on aspirin daily. MRI of the brain. Carotid Dopplers. As well as echocardiogram of the heart.  2. Hypertension: Continue HCTZ and lisinopril  3. Hypothyroidism continue levothyroxine  4. Hyperlipidemia continue pravastatin  5.  S.  aureus patient will be on Lovenox for DVT prophylaxis   All the records are reviewed and case discussed with ED provider. Management plans discussed with the patient, family and they are in agreement.  CODE STATUS:Full    TOTAL TIME TAKING CARE OF THIS PATIENT: 3 minutes  Dustin Flock M.D on 02/09/2015 at 12:23 PM  Between 7am to 6pm - Pager - [phone]  After 6pm go to [email or web address] - password [email or web address] [facility]  Office  [phone]  CC: Primary care physician; WHITE, [name], NP

## 2015-02-10 ENCOUNTER — Inpatient Hospital Stay: Payer: Medicare Other

## 2015-02-10 ENCOUNTER — Encounter: Payer: Self-pay | Admitting: Radiology

## 2015-02-10 DIAGNOSIS — I63411 Cerebral infarction due to embolism of right middle cerebral artery: Secondary | ICD-10-CM

## 2015-02-10 LAB — LIPID PANEL
CHOLESTEROL: 125 mg/dL (ref 0–200)
HDL: 41 mg/dL (ref >40)
LDL CALC: 63 mg/dL (ref 0–99)
TRIGLYCERIDES: 104 mg/dL (ref <150)
Total CHOL/HDL Ratio: 3 RATIO
VLDL: 21 mg/dL (ref 0–40)

## 2015-02-10 LAB — BASIC METABOLIC PANEL
Anion gap: 9 (ref 5–15)
BUN: 21 mg/dL — ABNORMAL HIGH (ref 6–20)
CHLORIDE: 103 mmol/L (ref 101–111)
CO2: 26 mmol/L (ref 22–32)
CREATININE: 0.88 mg/dL (ref 0.44–1.00)
Calcium: 9.2 mg/dL (ref 8.9–10.3)
GFR calc Af Amer: >60 mL/min (ref >60)
Glucose, Bld: 112 mg/dL — ABNORMAL HIGH (ref 65–99)
Potassium: 3.5 mmol/L (ref 3.5–5.1)
Sodium: 138 mmol/L (ref 135–145)

## 2015-02-10 LAB — CBC
HCT: 36.9 % (ref 35.0–47.0)
HEMOGLOBIN: 12.6 g/dL (ref 12.0–16.0)
MCH: 30.1 pg (ref 26.0–34.0)
MCHC: 34.1 g/dL (ref 32.0–36.0)
MCV: 88.3 fL (ref 80.0–100.0)
Platelets: 210 10*3/uL (ref 150–440)
RBC: 4.17 MIL/uL (ref 3.80–5.20)
RDW: 13.2 % (ref 11.5–14.5)
WBC: 5.7 10*3/uL (ref 3.6–11.0)

## 2015-02-10 MED ORDER — IOHEXOL 350 MG/ML SOLN
80.0000 mL | Freq: Once | INTRAVENOUS | Status: AC | PRN
  Administered 2015-02-10: 75 mL via INTRAVENOUS

## 2015-02-10 MED ORDER — ATORVASTATIN CALCIUM 80 MG PO TABS: 80.0000 mg | ORAL_TABLET | Freq: Every day | ORAL | Status: DC

## 2015-02-10 MED ORDER — ASPIRIN 81 MG PO TBEC: 81.0000 mg | DELAYED_RELEASE_TABLET | Freq: Every day | ORAL | Status: DC

## 2015-02-10 MED ORDER — ATORVASTATIN CALCIUM 20 MG PO TABS
80.0000 mg | ORAL_TABLET | Freq: Every day | ORAL | Status: DC
  Administered 2015-02-10: 80 mg via ORAL
  Filled 2015-02-10: qty 4

## 2015-02-10 NOTE — Discharge Summary (Signed)
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 Hypertension   . Hypothyroid   . Hypercholesteremia   . DM (diabetes mellitus)     diet controlled  . Generalized OA   . Osteoporosis   . Migraine   . GERD (gastroesophageal reflux disease)     HOSPITAL COURSE:   1. Acute strokes- in speaking with neurologist Dr. Irish Elders, these are probably from watershed area with low blood pressure. Aspirin started on this hospitalization will continue. Switch pravastatin over to atorvastatin. Hold antihypertensives medications at this point. Outpatient occupational therapy for incoordination of the left hand. 2. Relative hypotension- hold hydrochlorothiazide and lisinopril at this time.  3. Hyperlipidemia unspecified - switch pravastatin to atorvastatin. 4. Hypothyroidism unspecified continue levothyroxine. 5. Diet-controlled diabetes 6. For carotid stenosis this is nonsurgical- follow-up as outpatient for repeat sonogram in 6 months with vascular surgery.   DISCHARGE CONDITIONS:   Satisfactory  CONSULTS OBTAINED:  Treatment Team:  Dustin Flock, MD Leotis Pain, MD  DRUG ALLERGIES:  No Known Allergies  DISCHARGE MEDICATIONS:   Current Discharge Medication List    START taking these medications   Details  aspirin EC 81 MG EC tablet Take 1 tablet (81 mg total) by mouth daily. Qty: 30 tablet, Refills: 5    atorvastatin (LIPITOR) 80 MG tablet Take 1 tablet (80 mg total) by mouth daily at 6  PM. Qty: 30 tablet, Refills: 0      CONTINUE these medications which have NOT CHANGED   Details  levothyroxine (SYNTHROID, LEVOTHROID) 50 MCG tablet Take 1 tablet by mouth every morning.    Vitamin D, Ergocalciferol, (DRISDOL) 50000 UNITS CAPS capsule Take 50,000 Units by mouth every 7 (seven) days.    phenyltoloxamine-acetaminophen 30-325 MG per tablet Take 1 tablet by mouth every 4 (four) hours as needed for pain.      STOP taking these medications     hydrochlorothiazide (HYDRODIURIL) 25 MG tablet      lisinopril (PRINIVIL,ZESTRIL) 20 MG tablet      pravastatin (PRAVACHOL) 80 MG tablet      pravastatin (PRAVACHOL) 80 MG tablet      lisinopril (PRINIVIL,ZESTRIL) 10 MG tablet          DISCHARGE INSTRUCTIONS:   Follow-up with Eulogio Bear 1 week. Follow-up with Dr. do vascular surgery 6 months.   If you experience worsening of your admission symptoms, develop shortness of breath, life threatening emergency, suicidal or homicidal thoughts you must seek medical attention immediately by calling 911 or calling your MD immediately  if symptoms less severe.  You Must read complete instructions/literature along with all the possible adverse reactions/side effects for all the Medicines you take and that have been prescribed to you. Take any new Medicines after you have completely understood and accept all the possible adverse reactions/side effects.   Please note  You were cared for by a hospitalist during your hospital stay. If you have any questions about your discharge medications or the care you received while you were in the hospital after  you are discharged, you can call the unit and asked to speak with the hospitalist on call if the hospitalist that took care of you is not available. Once you are discharged, your primary care physician will handle any further medical issues. Please note that NO REFILLS for any discharge medications will be authorized once you are  discharged, as it is imperative that you return to your primary care physician (or establish a relationship with a primary care physician if you do not have one) for your aftercare needs so that they can reassess your need for medications and monitor your lab values.    Today   CHIEF COMPLAINT:   Chief Complaint  Patient presents with  . Hand Pain    HISTORY OF PRESENT ILLNESS:  Erica Levy  is a 74 y.o. female with a known history of hypertension, hyperlipidemia, hypothyroidism. She presented with left hand incoordination. Found to have acute strokes on MRI of the brain.   VITAL SIGNS:  Blood pressure 140/56, pulse 80, temperature 98 F (36.7 C), temperature source Oral, resp. rate 18, height 5\' 1"  (1.549 m), weight 72.717 kg (160 lb 5 oz), SpO2 100 %.  I/O:    Intake/Output Summary (Last 24 hours) at 02/10/15 1449 Last data filed at 02/10/15 1300  Gross per 24 hour  Intake    723 ml  Output      0 ml  Net    723 ml    PHYSICAL EXAMINATION:  GENERAL:  74 y.o.-year-old patient lying in the bed with no acute distress.  EYES: Pupils equal, round, reactive to light and accommodation. No scleral icterus. Extraocular muscles intact.  HEENT: Head atraumatic, normocephalic. Oropharynx and nasopharynx clear.  NECK:  Supple, no jugular venous distention. No thyroid enlargement, no tenderness.  LUNGS: Normal breath sounds bilaterally, no wheezing, rales,rhonchi or crepitation. No use of accessory muscles of respiration.  CARDIOVASCULAR: S1, S2 normal. No murmurs, rubs, or gallops.  ABDOMEN: Soft, non-tender, non-distended. Bowel sounds present. No organomegaly or mass.  EXTREMITIES: No pedal edema, cyanosis, or clubbing.  NEUROLOGIC: Cranial nerves II through XII are intact. Muscle strength 5/5 in all extremities. Sensation intact. Gait not checked.  PSYCHIATRIC: The patient is alert and oriented x 3.  SKIN: No obvious rash, lesion, or ulcer.   DATA REVIEW:   CBC  Recent  Labs Lab 02/10/15 0428  WBC 5.7  HGB 12.6  HCT 36.9  PLT 210    Chemistries   Recent Labs Lab 02/10/15 0428  NA 138  K 3.5  CL 103  CO2 26  GLUCOSE 112*  BUN 21*  CREATININE 0.88  CALCIUM 9.2    Cardiac Enzymes  Recent Labs Lab 02/09/15 1012  TROPONINI <0.03   RADIOLOGY:  Ct Head Wo Contrast  02/09/2015   CLINICAL DATA:  Left arm weakness, started approximately 7 a.m. this morning.  EXAM: CT HEAD WITHOUT CONTRAST  TECHNIQUE: Contiguous axial images were obtained from the base of the skull through the vertex without contrast.  COMPARISON:  None  FINDINGS: No evidence for acute hemorrhage, mass lesion, midline shift, hydrocephalus or large infarct. Visualized paranasal sinuses are clear. No acute bone abnormality.  IMPRESSION: No acute intracranial abnormality.   Electronically Signed   By: Markus Daft M.D.   On: 02/09/2015 11:00   Ct Angio Neck W/cm &/or Wo/cm  02/10/2015   CLINICAL DATA:  Left-sided weakness.  Right MCA territory infarcts.  EXAM: CT ANGIOGRAPHY NECK  TECHNIQUE: Multidetector CT imaging of the neck was performed  using the standard protocol during bolus administration of intravenous contrast. Multiplanar CT image reconstructions and MIPs were obtained to evaluate the vascular anatomy. Carotid stenosis measurements (when applicable) are obtained utilizing NASCET criteria, using the distal internal carotid diameter as the denominator.  CONTRAST:  26mL OMNIPAQUE IOHEXOL 350 MG/ML SOLN  COMPARISON:  MRI brain 02/09/2015.  Carotid ultrasound 02/09/2015.  FINDINGS: Aortic arch: A 3 vessel arch configuration is present. Atherosclerotic calcifications are evident at the aortic arch without significant stenosis.  Right carotid system: The right common carotid artery is within normal limits. Noncalcified atherosclerotic stenosis is present just beyond the carotid bifurcation. The minimal luminal diameter is 2.0 mm. This compares with a more normal distal lumen of 3.8 mm on the  right and 4.3 mm on the left. This calculates to just less than 50% stenosis by NASCET criteria. The more distal right ICA is unremarkable. Minimal calcifications are present within the cavernous internal carotid artery without stenosis.  Left carotid system: The left common carotid artery is within normal limits. The bifurcation is unremarkable. Mild tortuosity is present in the cervical left ICA without significant stenosis.  Vertebral arteries:Both vertebral arteries originate from the subclavian arteries. Focal irregularity of the proximal left vertebral artery may reflect remote injury. There is marked tortuosity at the C1-2 level on the left without a significant stenosis. There is some irregularity of the vessel. Focal stenosis is present in the left vertebral artery at the C2-3 level measuring approximately 50% relative to the more distal vessel. No focal stenosis is evident on the right.  Skeleton: Moderate endplate degenerative changes are present at C4-5 and C5-6 with uncovertebral spurring an osseous foraminal narrowing. Slight degenerative anterolisthesis is present at C3-4. No focal lytic or blastic lesions are present. The mandible is intact and located.  Other neck: No focal mucosal or submucosal lesions are present. There is no significant adenopathy. The salivary glands are within normal limits. The thyroid is unremarkable. The lung apices are clear.  IMPRESSION: 1. 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 2. No significant stenosis of the left carotid artery. 3. Focal irregularity of the proximal left vertebral artery and 50% stenosis at the C2-3 level of the left vertebral artery suggesting previous vascular injury or possibly fibromuscular dysplasia. 4. Moderate spondylosis of the cervical spine as described.   Electronically Signed   By: San Morelle M.D.   On: 02/10/2015 13:37   Mr Brain Wo Contrast  02/09/2015   CLINICAL DATA:   Woke this morning with numbness of the left hand and headache.  EXAM: MRI HEAD WITHOUT CONTRAST  TECHNIQUE: Multiplanar, multiecho pulse sequences of the brain and surrounding structures were obtained without intravenous contrast.  COMPARISON:  Head CT same day  FINDINGS: There are a cluster (3 to 5) of punctate foci of acute infarction in the right parietal cortex consistent with micro emboli within a right MCA branch vessel. There is a second cluster anterior that of 5-10 foci consistent with additional emboli within a different right MCA branch vessel. No large confluent infarction. No mass lesion, hemorrhage, hydrocephalus or extra-axial collection. The remainder the brain appears normal without any old infarctions. No pituitary mass. No inflammatory sinus disease. No skull or skullbase lesion. Major vessels at the base of the brain show flow.  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 No swelling or hemorrhage. The remainder of the brain is normal.  Electronically Signed   By: Nelson Chimes M.D.   On: 02/09/2015 14:23   US Carotid Bilateral  02/09/2015   CLINICAL DATA:  Cerebrovascular accident.  EXAM: BILATERAL CAROTID DUPLEX ULTRASOUND  TECHNIQUE: Pearline Cables scale imaging, color Doppler and duplex ultrasound were performed of bilateral carotid and vertebral arteries in the neck.  COMPARISON:  None.  FINDINGS: Criteria: Quantification of carotid stenosis is based on velocity parameters that correlate the residual internal carotid diameter with NASCET-based stenosis levels, using the diameter of the distal internal carotid lumen as the denominator for stenosis measurement.  The following velocity measurements were obtained:  RIGHT  ICA:  135/38 cm/sec  CCA:  093/81 cm/sec  SYSTOLIC ICA/CCA RATIO:  1.1  DIASTOLIC ICA/CCA RATIO:  1.7  ECA:  140 cm/sec  LEFT  ICA:  117/20 cm/sec  CCA:  829/93 cm/sec  SYSTOLIC ICA/CCA  RATIO:  1.0  DIASTOLIC ICA/CCA RATIO:  0.9  ECA:  140 cm/sec  RIGHT CAROTID ARTERY: Moderate irregular plaque formation is noted in the right carotid bulb and the proximal right internal carotid artery consistent with 50-69% stenosis based on ultrasound and Doppler criteria.  RIGHT VERTEBRAL ARTERY:  Antegrade flow is noted.  LEFT CAROTID ARTERY: Minimal calcified plaque formation is noted in proximal left internal carotid artery consistent with less than 50% diameter stenosis based on ultrasound and Doppler criteria.  LEFT VERTEBRAL ARTERY:  Antegrade flow is noted.  IMPRESSION: Moderate irregular plaque formation is noted in the right carotid bulb and proximal right internal carotid artery consistent with 50-69% stenosis based on ultrasound and Doppler criteria.  Minimal calcified plaque formation is noted in the proximal left internal carotid artery consistent with less than 50% diameter stenosis based on ultrasound and Doppler criteria.   Electronically Signed   By: Marijo Conception, M.D.   On: 02/09/2015 13:19   Management plans discussed with the patient, family and they are in agreement.  CODE STATUS:     Code Status Orders        Start     Ordered   02/09/15 1449  Full code   Continuous     02/09/15 1448      TOTAL TIME TAKING CARE OF THIS PATIENT: 42 minutes.    Loletha Grayer M.D on 02/10/2015 at 2:49 PM  Between 7am to 6pm - Pager - [phone]  After 6pm go to [email or web address] - password [email or web address] [facility]  Office  [phone]  CC: Primary care physician; WHITE, [name], NP

## 2015-02-10 NOTE — Evaluation (Signed)
Occupational Therapy Evaluation Patient Details Name: Erica Levy MRN: [id number] DOB: 03-31-1941 Today's Date: 02/10/2015    History of Present Illness Erica Levy is a 74 y.o. female with a known history of migraines, hypertension, hypothyroidism, diet-controlled diabetes presented to the urgent care earlier today with complaint of left hand weakness which she woke up with. Patient reports that she does get some transient left hand weakness with a migrainous but this is worst and continued. And she has not had a headache. She also reported that she was seeing some squiggly lines in both of her eyes which have not improved. Patient came to the emergency room with the symptoms CT scan of the head was negative. 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 She continues to have some left hand weakness but significantly improved from yesterday. No LE complaints. Reports LE strength and coordination are baseline.    Clinical Impression   Pt seen for evaluation of ADLs and functional use of LUE and hand due to decreased strength and coordination.  Pt presents with strength of 52# on R and 24# on L which is decreasing functional use of LUE and hand for ADLs.  Sensation is intact as well as proprioception.  She is able to use L hand with R for opening toothpaste and donning/doffing socks but requires extra time due to decreased use of intrinsic muscles of L hand.  She lives at home with her husband and helps care for grandchildren and would benefit from use of a shower chair with back, issued green theraputty to work on increasing strength in L hand as well as coordination exercises to do at home.  Rec continued outpatient OT to regain use of LUE and hand back to PLOF.     Follow Up Recommendations  Outpatient OT    Equipment Recommendations  Tub/shower seat    Recommendations for Other Services       Precautions / Restrictions Precautions Precautions:  None Restrictions Weight Bearing Restrictions: No      Mobility Bed Mobility                  Transfers                      Balance                                            ADL                                         General ADL Comments: Pt presents with decreased fine motor control in order to complete buttons for ADLs and needs extra time to don/doff socks but able to complete for LB dressing.  Extra time needed for completing grooming skills for brushing teeth for opening toothpaste.  Able to complete combing hair with R hand.  Rec using a shower chair with back for bathing to prevent falls.     Vision Vision Assessment?: Yes Eye Alignment: Within Functional Limits Ocular Range of Motion: Within Functional Limits Alignment/Gaze Preference: Within Defined Limits Tracking/Visual Pursuits: Decreased smoothness of vertical tracking Saccades: Within functional limits Convergence: Within functional limits Visual Fields: No apparent deficits   Perception     Praxis  Pertinent Vitals/Pain Pain Assessment: No/denies pain     Hand Dominance Right   Extremity/Trunk Assessment Upper Extremity Assessment Upper Extremity Assessment: LUE deficits/detail LUE Deficits / Details: Strength 24 # on left and 52#  on R  and presents with intact sensation but decreased intrinsic muscle strength for opposition and fine motor control; no increased tone or numbness or tingling LUE Coordination: decreased fine motor   Lower Extremity Assessment Lower Extremity Assessment: Defer to PT evaluation       Communication Communication Communication: No difficulties   Cognition Arousal/Alertness: Awake/alert Behavior During Therapy: WFL for tasks assessed/performed Overall Cognitive Status: Within Functional Limits for tasks assessed                     General Comments       Exercises       Shoulder Instructions       Home Living Family/patient expects to be discharged to:: Private residence Living Arrangements: Children;Spouse/significant other Available Help at Discharge: Family Type of Home: Mobile home Home Access: Stairs to enter Technical brewer of Steps: 5 Entrance Stairs-Rails: Can reach both Home Layout: One level     Bathroom Shower/Tub: Occupational psychologist: Standard Bathroom Accessibility: Yes How Accessible: Accessible via walker Home Equipment: None          Prior Functioning/Environment Level of Independence: Independent             OT Diagnosis: Generalized weakness;Hemiplegia non-dominant side   OT Problem List: Decreased strength   OT Treatment/Interventions: Self-care/ADL training;Therapeutic exercise    OT Goals(Current goals can be found in the care plan section) Acute Rehab OT Goals Patient Stated Goal: "i need to get my strength back in my left hand, but it is better today ." OT Goal Formulation: With patient/family Time For Goal Achievement: 02/24/15 Potential to Achieve Goals: Good  OT Frequency: Min 1X/week   Barriers to D/C:            Co-evaluation              End of Session Equipment Utilized During Treatment:  (theraputty green; dynamometer)  Activity Tolerance: Patient tolerated treatment well Patient left: in chair;with family/visitor present   Time: 1500-1540 OT Time Calculation (min): 40 min Charges:  OT General Charges $OT Visit: 1 Procedure OT Evaluation $Initial OT Evaluation Tier I: 1 Procedure OT Treatments $Therapeutic Activity: 23-37 mins G-Codes:    Asad Keeven 2015-02-20, 4:27 PM    Chrys Racer, OTR/L ascom [phone]

## 2015-02-10 NOTE — Progress Notes (Signed)
Notified Dr. Marcille Blanco of pts  DBP < 60, acknowledge,  no new orders.

## 2015-02-10 NOTE — Progress Notes (Signed)
MD order to discharge to home.  Reviewed discharge instructions with patient.  IV discontinued.  Pt verbalized understanding of discharge instructions.Pt discharged.

## 2015-02-10 NOTE — Evaluation (Signed)
Physical Therapy Evaluation Patient Details Name: Erica Levy MRN: [id number] DOB: 08-Nov-1940 Today's Date: 02/10/2015   History of Present Illness  Erica Levy is a 74 y.o. female with a known history of migraines, hypertension, hypothyroidism, diet-controlled diabetes presented to the urgent care earlier today with complaint of left hand weakness which she woke up with. Patient reports that she does get some transient left hand weakness with a migrainous but this is worst and continued. And she has not had a headache. She also reported that she was seeing some squiggly lines in both of her eyes which have not improved. Patient came to the emergency room with the symptoms CT scan of the head was negative. 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 She continues to have some left hand weakness but significantly improved from yesterday. No LE complaints. Reports LE strength and coordination are baseline.   Clinical Impression  Pt does not have any deficits at this time with the exception of decreased L grip strength which should be assessed by OT. Otherwise mobility and ambulation are baseline. Pt has no further need for PT services and order will be completed. Please enter new order if status changes.     Follow Up Recommendations No PT follow up    Equipment Recommendations   (None)    Recommendations for Other Services       Precautions / Restrictions Precautions Precautions: None Restrictions Weight Bearing Restrictions: No      Mobility  Bed Mobility               General bed mobility comments: Received upright in recliner  Transfers Overall transfer level: Independent Equipment used: None             General transfer comment: Good strength/stability noted. No deficits  Ambulation/Gait Ambulation/Gait assistance: Independent Ambulation Distance (Feet): 125 Feet Assistive device: None Gait Pattern/deviations: WFL(Within  Functional Limits)   Gait velocity interpretation: >2.62 ft/sec, indicative of independent community ambulator General Gait Details: Good strength, speed, and balance. Modified DGI 12/12. Gait is baseline and safe.   Stairs            Wheelchair Mobility    Modified Rankin (Stroke Patients Only)       Balance Overall balance assessment: Independent Sitting-balance support: No upper extremity supported;Feet unsupported Sitting balance-Leahy Scale: Normal     Standing balance support: No upper extremity supported Standing balance-Leahy Scale: Good   Single Leg Stance - Right Leg: 20 Single Leg Stance - Left Leg: 20 Tandem Stance - Right Leg: 10 Tandem Stance - Left Leg: 10 Rhomberg - Eyes Opened: 30 Rhomberg - Eyes Closed: 30   High Level Balance Comments: No deficits identified             Pertinent Vitals/Pain Pain Assessment: No/denies pain    Home Living Family/patient expects to be discharged to:: Private residence Living Arrangements: Spouse/significant other Available Help at Discharge: Family Type of Home: Mobile home Home Access: Stairs to enter Entrance Stairs-Rails: Can reach both Entrance Stairs-Number of Steps: 5 Home Layout: One level Home Equipment: None      Prior Function Level of Independence: Independent               Hand Dominance        Extremity/Trunk Assessment   Upper Extremity Assessment: Overall WFL for tasks assessed;LUE deficits/detail       LUE Deficits / Details: Decreased L grip strength noted. Finger opposition and finger flexion  strength is good.    Lower Extremity Assessment: Overall WFL for tasks assessed (No focal weakness identified)         Communication   Communication: No difficulties  Cognition Arousal/Alertness: Awake/alert Behavior During Therapy: WFL for tasks assessed/performed Overall Cognitive Status: Within Functional Limits for tasks assessed                      General  Comments      Exercises        Assessment/Plan    PT Assessment Patent does not need any further PT services  PT Diagnosis Other (comment) (NO deficits identified)   PT Problem List    PT Treatment Interventions     PT Goals (Current goals can be found in the Care Plan section) Acute Rehab PT Goals Patient Stated Goal: "I feel good, no problems moving or walking" PT Goal Formulation: With patient    Frequency     Barriers to discharge        Co-evaluation               End of Session Equipment Utilized During Treatment: Gait belt Activity Tolerance: Patient tolerated treatment well Patient left: in chair;with call bell/phone within reach;with family/visitor present           Time: 3338-3291 PT Time Calculation (min) (ACUTE ONLY): 13 min   Charges:   PT Evaluation $Initial PT Evaluation Tier I: 1 Procedure     PT G Codes:       Lyndel Safe Idelle Reimann PT, DPT   Acelin Ferdig 02/10/2015, 12:01 PM

## 2015-02-10 NOTE — Care Management (Addendum)
Admitted to [facility] with the diagnosis of CVA. Lives with husband, Lanae Boast [phone]) Martin NP at [facility] seen 2 months ago. No home health. No skilled facility. No home oxygen. Uses no aids for ambulation. Takes care of all activities of daily living herself.  Physical therapy evaluation completed. No follow needs recommended. Occupational therapy to evaluate. Possible discharge following occupational therapy evaluation. Shelbie Ammons RN MSN Care Management [phone]

## 2015-02-10 NOTE — Progress Notes (Signed)
Patient ID: Erica Levy, female   DOB: [birth year], 74 y.o.   MRN: [id number] [facility] PROGRESS NOTE  PCP: WHITE, [name], NP  HPI/Subjective: Patient complains of incoordination of the left hand. She feels okay. She feels her strength is okay in her legs.  Objective: Filed Vitals:   02/10/15 0646  BP: 103/43  Pulse: 64  Temp: 97.9 F (36.6 C)  Resp: 16    Intake/Output Summary (Last 24 hours) at 02/10/15 0846 Last data filed at 02/09/15 1800  Gross per 24 hour  Intake    243 ml  Output      0 ml  Net    243 ml   Filed Weights   02/09/15 0942 02/09/15 1547  Weight: 72.122 kg (159 lb) 72.717 kg (160 lb 5 oz)    ROS: Review of Systems  Constitutional: Negative for fever and chills.  Eyes: Negative for blurred vision.  Respiratory: Negative for cough and shortness of breath.   Cardiovascular: Negative for chest pain.  Gastrointestinal: Negative for nausea, vomiting, diarrhea and constipation.  Genitourinary: Negative for dysuria.  Musculoskeletal: Negative for joint pain.  Neurological: Positive for focal weakness. Negative for dizziness and headaches.   Exam: Physical Exam  Constitutional: She is oriented to person, place, and time.  HENT:  Nose: No mucosal edema.  Mouth/Throat: No oropharyngeal exudate or posterior oropharyngeal edema.  Eyes: Conjunctivae, EOM and lids are normal. Pupils are equal, round, and reactive to light.  Neck: No JVD present. Carotid bruit is not present. No edema present. No thyroid mass and no thyromegaly present.  Cardiovascular: S1 normal and S2 normal.  Exam reveals no gallop.   No murmur heard. Pulses:      Dorsalis pedis pulses are 2+ on the right side, and 2+ on the left side.  Respiratory: No respiratory distress. She has no wheezes. She has no rhonchi. She has no rales.  GI: Soft. Bowel sounds are normal. There is no tenderness.  Musculoskeletal:       Right shoulder: She exhibits no swelling.   Lymphadenopathy:    She has no cervical adenopathy.  Neurological: She is alert and oriented to person, place, and time. No cranial nerve deficit.  Incoordination of the left hand. Difficulty with rapid finger movements. Strength upper and lower extremities 5 out of 5 bilaterally  Skin: Skin is warm. No rash noted. Nails show no clubbing.  Psychiatric: She has a normal mood and affect.    Data Reviewed: Basic Metabolic Panel:  Recent Labs Lab 02/09/15 1012 02/10/15 0428  NA 138 138  K 4.0 3.5  CL 107 103  CO2 27 26  GLUCOSE 143* 112*  BUN 28* 21*  CREATININE 1.09* 0.88  CALCIUM 9.0 9.2    CBC:  Recent Labs Lab 02/09/15 1012 02/10/15 0428  WBC 5.4 5.7  HGB 14.0 12.6  HCT 40.4 36.9  MCV 89.5 88.3  PLT 214 210    Studies: Ct Head Wo Contrast  02/09/2015   CLINICAL DATA:  Left arm weakness, started approximately 7 a.m. this morning.  EXAM: CT HEAD WITHOUT CONTRAST  TECHNIQUE: Contiguous axial images were obtained from the base of the skull through the vertex without contrast.  COMPARISON:  None  FINDINGS: No evidence for acute hemorrhage, mass lesion, midline shift, hydrocephalus or large infarct. Visualized paranasal sinuses are clear. No acute bone abnormality.  IMPRESSION: No acute intracranial abnormality.   Electronically Signed   By: Markus Daft M.D.   On: 02/09/2015 11:00  Mr Brain Wo Contrast  02/09/2015   CLINICAL DATA:  Woke this morning with numbness of the left hand and headache.  EXAM: MRI HEAD WITHOUT CONTRAST  TECHNIQUE: Multiplanar, multiecho pulse sequences of the brain and surrounding structures were obtained without intravenous contrast.  COMPARISON:  Head CT same day  FINDINGS: There are a cluster (3 to 5) of punctate foci of acute infarction in the right parietal cortex consistent with micro emboli within a right MCA branch vessel. There is a second cluster anterior that of 5-10 foci consistent with additional emboli within a different right MCA branch  vessel. No large confluent infarction. No mass lesion, hemorrhage, hydrocephalus or extra-axial collection. The remainder the brain appears normal without any old infarctions. No pituitary mass. No inflammatory sinus disease. No skull or skullbase lesion. Major vessels at the base of the brain show flow.  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 No swelling or hemorrhage. The remainder of the brain is normal.   Electronically Signed   By: Nelson Chimes M.D.   On: 02/09/2015 14:23   US Carotid Bilateral  02/09/2015   CLINICAL DATA:  Cerebrovascular accident.  EXAM: BILATERAL CAROTID DUPLEX ULTRASOUND  TECHNIQUE: Pearline Cables scale imaging, color Doppler and duplex ultrasound were performed of bilateral carotid and vertebral arteries in the neck.  COMPARISON:  None.  FINDINGS: Criteria: Quantification of carotid stenosis is based on velocity parameters that correlate the residual internal carotid diameter with NASCET-based stenosis levels, using the diameter of the distal internal carotid lumen as the denominator for stenosis measurement.  The following velocity measurements were obtained:  RIGHT  ICA:  135/38 cm/sec  CCA:  970/26 cm/sec  SYSTOLIC ICA/CCA RATIO:  1.1  DIASTOLIC ICA/CCA RATIO:  1.7  ECA:  140 cm/sec  LEFT  ICA:  117/20 cm/sec  CCA:  378/58 cm/sec  SYSTOLIC ICA/CCA RATIO:  1.0  DIASTOLIC ICA/CCA RATIO:  0.9  ECA:  140 cm/sec  RIGHT CAROTID ARTERY: Moderate irregular plaque formation is noted in the right carotid bulb and the proximal right internal carotid artery consistent with 50-69% stenosis based on ultrasound and Doppler criteria.  RIGHT VERTEBRAL ARTERY:  Antegrade flow is noted.  LEFT CAROTID ARTERY: Minimal calcified plaque formation is noted in proximal left internal carotid artery consistent with less than 50% diameter stenosis based on ultrasound and Doppler criteria.  LEFT VERTEBRAL ARTERY:   Antegrade flow is noted.  IMPRESSION: Moderate irregular plaque formation is noted in the right carotid bulb and proximal right internal carotid artery consistent with 50-69% stenosis based on ultrasound and Doppler criteria.  Minimal calcified plaque formation is noted in the proximal left internal carotid artery consistent with less than 50% diameter stenosis based on ultrasound and Doppler criteria.   Electronically Signed   By: Marijo Conception, M.D.   On: 02/09/2015 13:19    Scheduled Meds: . aspirin EC  81 mg Oral Daily  . atorvastatin  80 mg Oral q1800  . enoxaparin (LOVENOX) injection  40 mg Subcutaneous Q24H  . hydrochlorothiazide  25 mg Oral q morning - 10a  . levothyroxine  50 mcg Oral QAC breakfast  . lisinopril  20 mg Oral q morning - 10a  . sodium chloride  3 mL Intravenous Q12H  . Vitamin D (Ergocalciferol)  50,000 Units Oral Q7 days   Continuous Infusions:   Assessment/Plan:  1. Numerous small acute cortical infarctions consistent with micro-emboli of the right MCA  distribution. Carotid stenosis seen on sonogram. I will get a CT angiogram of the carotids today to evaluate whether or not this would be a surgical case or not. Continue aspirin. Change pravastatin over to high dose Lipitor. Echocardiogram shows no cardiac source of emboli. Telemetry monitoring the patient is in normal sinus rhythm. Await neurology, PT,OT consultations 2. History of hypertension- blood pressure on the lower side. I will hold lisinopril and hydrochlorothiazide at this point. 3.   Hyperlipidemia unspecified- change pravastatin to Lipitor. 4.   Hypothyroidism unspecified continue levothyroxine.  Code Status:     Code Status Orders        Start     Ordered   02/09/15 1449  Full code   Continuous     02/09/15 1448     Family Communication: Husband at bedside Disposition Plan: Likely home soon, depending on CT angiogram of the neck results  Consultants:  Neurology  PT  OT  Time spent:  30 minutes  [place], [facility]

## 2015-02-10 NOTE — Consult Note (Signed)
CC: L hand weakness   HPI: Erica Levy is an 74 y.o. female known history of migraines, hypertension, hypothyroidism, diet-controlled diabetes presented to the urgent care earlier today with complaint of left hand weakness which she woke up with. Patient reports that she does get some transient left hand weakness. L hand weakness is improved and close to baseline.  MRI showing R MCA embolic infarcts.  Was not on any anti platelet prior to admission.    Past Medical History  Diagnosis Date  . Hypertension   . Hypothyroid   . Hypercholesteremia   . DM (diabetes mellitus)     diet controlled  . Generalized OA   . Osteoporosis   . Migraine   . GERD (gastroesophageal reflux disease)     Past Surgical History  Procedure Laterality Date  . Tonsillectomy    . Eye surgery    . Cataract extraction    . Retinal detachment surgery      Family History  Problem Relation Age of Onset  . Hypertension Mother   . Osteoarthritis Mother   . Dementia Father     Social History:  reports that she has never smoked. She does not have any smokeless tobacco history on file. She reports that she does not drink alcohol or use illicit drugs.  No Known Allergies  Medications: I have reviewed the patient's current medications.  ROS: History obtained from the patient  General ROS: negative for - chills, fatigue, fever, night sweats, weight gain or weight loss Psychological ROS: negative for - behavioral disorder, hallucinations, memory difficulties, mood swings or suicidal ideation Ophthalmic ROS: negative for - blurry vision, double vision, eye pain or loss of vision ENT ROS: negative for - epistaxis, nasal discharge, oral lesions, sore throat, tinnitus or vertigo Allergy and Immunology ROS: negative for - hives or itchy/watery eyes Hematological and Lymphatic ROS: negative for - bleeding problems, bruising or swollen lymph nodes Endocrine ROS: negative for - galactorrhea, hair pattern  changes, polydipsia/polyuria or temperature intolerance Respiratory ROS: negative for - cough, hemoptysis, shortness of breath or wheezing Cardiovascular ROS: negative for - chest pain, dyspnea on exertion, edema or irregular heartbeat Gastrointestinal ROS: negative for - abdominal pain, diarrhea, hematemesis, nausea/vomiting or stool incontinence Genito-Urinary ROS: negative for - dysuria, hematuria, incontinence or urinary frequency/urgency Musculoskeletal ROS: negative for - joint swelling or muscular weakness Neurological ROS: as noted in HPI Dermatological ROS: negative for rash and skin lesion changes  Physical Examination: Blood pressure 101/62, pulse 82, temperature 98.2 F (36.8 C), temperature source Oral, resp. rate 18, height 5\' 1"  (1.549 m), weight 72.717 kg (160 lb 5 oz), SpO2 98 %.    Neurological Examination Mental Status: Alert, oriented, thought content appropriate.  Speech fluent without evidence of aphasia.  Able to follow 3 step commands without difficulty. Cranial Nerves: II: Discs flat bilaterally; Visual fields grossly normal, pupils equal, round, reactive to light and accommodation III,IV, VI: ptosis not present, extra-ocular motions intact bilaterally V,VII: smile symmetric, facial light touch sensation normal bilaterally VIII: hearing normal bilaterally IX,X: gag reflex present XI: bilateral shoulder shrug XII: midline tongue extension Motor: Right : Upper extremity   5/5    Left:     Upper extremity   5/5  Lower extremity   5/5     Lower extremity   5/5 Tone and bulk:normal tone throughout; no atrophy noted Sensory: Pinprick and light touch intact throughout, bilaterally Deep Tendon Reflexes: +1  and symmetric throughout Plantars: Right: downgoing   Left:  downgoing Cerebellar: normal finger-to-nose, normal rapid alternating movements and normal heel-to-shin test Gait: not tested.      Laboratory Studies:   Basic Metabolic Panel:  Recent Labs Lab  02/09/15 1012 02/10/15 0428  NA 138 138  K 4.0 3.5  CL 107 103  CO2 27 26  GLUCOSE 143* 112*  BUN 28* 21*  CREATININE 1.09* 0.88  CALCIUM 9.0 9.2    Liver Function Tests: No results for input(s): AST, ALT, ALKPHOS, BILITOT, PROT, ALBUMIN in the last 168 hours. No results for input(s): LIPASE, AMYLASE in the last 168 hours. No results for input(s): AMMONIA in the last 168 hours.  CBC:  Recent Labs Lab 02/09/15 1012 02/10/15 0428  WBC 5.4 5.7  HGB 14.0 12.6  HCT 40.4 36.9  MCV 89.5 88.3  PLT 214 210    Cardiac Enzymes:  Recent Labs Lab 02/09/15 1012  TROPONINI <0.03    BNP: Invalid input(s): POCBNP  CBG: No results for input(s): GLUCAP in the last 168 hours.  Microbiology: No results found for this or any previous visit.  Coagulation Studies:  Recent Labs  02/09/15 1012  LABPROT 12.4  INR 0.90    Urinalysis: No results for input(s): COLORURINE, LABSPEC, PHURINE, GLUCOSEU, HGBUR, BILIRUBINUR, KETONESUR, PROTEINUR, UROBILINOGEN, NITRITE, LEUKOCYTESUR in the last 168 hours.  Invalid input(s): APPERANCEUR  Lipid Panel:     Component Value Date/Time   CHOL 125 02/10/2015 0428   TRIG 104 02/10/2015 0428   HDL 41 02/10/2015 0428   CHOLHDL 3.0 02/10/2015 0428   VLDL 21 02/10/2015 0428   LDLCALC 63 02/10/2015 0428    HgbA1C:  Lab Results  Component Value Date   HGBA1C 5.4 02/09/2015    Urine Drug Screen:  No results found for: LABOPIA, COCAINSCRNUR, LABBENZ, AMPHETMU, THCU, LABBARB  Alcohol Level: No results for input(s): ETH in the last 168 hours.  Other results: EKG: normal EKG, normal sinus rhythm, unchanged from previous tracings.  Imaging: Ct Head Wo Contrast  02/09/2015   CLINICAL DATA:  Left arm weakness, started approximately 7 a.m. this morning.  EXAM: CT HEAD WITHOUT CONTRAST  TECHNIQUE: Contiguous axial images were obtained from the base of the skull through the vertex without contrast.  COMPARISON:  None  FINDINGS: No evidence for  acute hemorrhage, mass lesion, midline shift, hydrocephalus or large infarct. Visualized paranasal sinuses are clear. No acute bone abnormality.  IMPRESSION: No acute intracranial abnormality.   Electronically Signed   By: Markus Daft M.D.   On: 02/09/2015 11:00   Ct Angio Neck W/cm &/or Wo/cm  02/10/2015   CLINICAL DATA:  Left-sided weakness.  Right MCA territory infarcts.  EXAM: CT ANGIOGRAPHY NECK  TECHNIQUE: Multidetector CT imaging of the neck was performed using the standard protocol during bolus administration of intravenous contrast. Multiplanar CT image reconstructions and MIPs were obtained to evaluate the vascular anatomy. Carotid stenosis measurements (when applicable) are obtained utilizing NASCET criteria, using the distal internal carotid diameter as the denominator.  CONTRAST:  49mL OMNIPAQUE IOHEXOL 350 MG/ML SOLN  COMPARISON:  MRI brain 02/09/2015.  Carotid ultrasound 02/09/2015.  FINDINGS: Aortic arch: A 3 vessel arch configuration is present. Atherosclerotic calcifications are evident at the aortic arch without significant stenosis.  Right carotid system: The right common carotid artery is within normal limits. Noncalcified atherosclerotic stenosis is present just beyond the carotid bifurcation. The minimal luminal diameter is 2.0 mm. This compares with a more normal distal lumen of 3.8 mm on the right and 4.3 mm on the left. This  calculates to just less than 50% stenosis by NASCET criteria. The more distal right ICA is unremarkable. Minimal calcifications are present within the cavernous internal carotid artery without stenosis.  Left carotid system: The left common carotid artery is within normal limits. The bifurcation is unremarkable. Mild tortuosity is present in the cervical left ICA without significant stenosis.  Vertebral arteries:Both vertebral arteries originate from the subclavian arteries. Focal irregularity of the proximal left vertebral artery may reflect remote injury. There is  marked tortuosity at the C1-2 level on the left without a significant stenosis. There is some irregularity of the vessel. Focal stenosis is present in the left vertebral artery at the C2-3 level measuring approximately 50% relative to the more distal vessel. No focal stenosis is evident on the right.  Skeleton: Moderate endplate degenerative changes are present at C4-5 and C5-6 with uncovertebral spurring an osseous foraminal narrowing. Slight degenerative anterolisthesis is present at C3-4. No focal lytic or blastic lesions are present. The mandible is intact and located.  Other neck: No focal mucosal or submucosal lesions are present. There is no significant adenopathy. The salivary glands are within normal limits. The thyroid is unremarkable. The lung apices are clear.  IMPRESSION: 1. 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 2. No significant stenosis of the left carotid artery. 3. Focal irregularity of the proximal left vertebral artery and 50% stenosis at the C2-3 level of the left vertebral artery suggesting previous vascular injury or possibly fibromuscular dysplasia. 4. Moderate spondylosis of the cervical spine as described.   Electronically Signed   By: San Morelle M.D.   On: 02/10/2015 13:37   Mr Brain Wo Contrast  02/09/2015   CLINICAL DATA:  Woke this morning with numbness of the left hand and headache.  EXAM: MRI HEAD WITHOUT CONTRAST  TECHNIQUE: Multiplanar, multiecho pulse sequences of the brain and surrounding structures were obtained without intravenous contrast.  COMPARISON:  Head CT same day  FINDINGS: There are a cluster (3 to 5) of punctate foci of acute infarction in the right parietal cortex consistent with micro emboli within a right MCA branch vessel. There is a second cluster anterior that of 5-10 foci consistent with additional emboli within a different right MCA branch vessel. No large confluent infarction. No mass lesion,  hemorrhage, hydrocephalus or extra-axial collection. The remainder the brain appears normal without any old infarctions. No pituitary mass. No inflammatory sinus disease. No skull or skullbase lesion. Major vessels at the base of the brain show flow.  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 No swelling or hemorrhage. The remainder of the brain is normal.   Electronically Signed   By: Nelson Chimes M.D.   On: 02/09/2015 14:23   US Carotid Bilateral  02/09/2015   CLINICAL DATA:  Cerebrovascular accident.  EXAM: BILATERAL CAROTID DUPLEX ULTRASOUND  TECHNIQUE: Pearline Cables scale imaging, color Doppler and duplex ultrasound were performed of bilateral carotid and vertebral arteries in the neck.  COMPARISON:  None.  FINDINGS: Criteria: Quantification of carotid stenosis is based on velocity parameters that correlate the residual internal carotid diameter with NASCET-based stenosis levels, using the diameter of the distal internal carotid lumen as the denominator for stenosis measurement.  The following velocity measurements were obtained:  RIGHT  ICA:  135/38 cm/sec  CCA:  094/07 cm/sec  SYSTOLIC ICA/CCA RATIO:  1.1  DIASTOLIC ICA/CCA RATIO:  1.7  ECA:  140  cm/sec  LEFT  ICA:  117/20 cm/sec  CCA:  482/50 cm/sec  SYSTOLIC ICA/CCA RATIO:  1.0  DIASTOLIC ICA/CCA RATIO:  0.9  ECA:  140 cm/sec  RIGHT CAROTID ARTERY: Moderate irregular plaque formation is noted in the right carotid bulb and the proximal right internal carotid artery consistent with 50-69% stenosis based on ultrasound and Doppler criteria.  RIGHT VERTEBRAL ARTERY:  Antegrade flow is noted.  LEFT CAROTID ARTERY: Minimal calcified plaque formation is noted in proximal left internal carotid artery consistent with less than 50% diameter stenosis based on ultrasound and Doppler criteria.  LEFT VERTEBRAL ARTERY:  Antegrade flow is noted.  IMPRESSION: Moderate irregular  plaque formation is noted in the right carotid bulb and proximal right internal carotid artery consistent with 50-69% stenosis based on ultrasound and Doppler criteria.  Minimal calcified plaque formation is noted in the proximal left internal carotid artery consistent with less than 50% diameter stenosis based on ultrasound and Doppler criteria.   Electronically Signed   By: Marijo Conception, M.D.   On: 02/09/2015 13:19     Assessment/Plan: 74 y.o. female known history of migraines, hypertension, hypothyroidism, diet-controlled diabetes presented to the urgent care earlier today with complaint of left hand weakness which she woke up with. Patient reports that she does get some transient left hand weakness. L hand weakness is improved and close to baseline.  MRI showing R MCA embolic infarcts.  Was not on any anti platelet prior to admission.   CTA about 50% stenosis R carotid.   - d/c planning. Vascular follow up 3-6 months as well as Ultrasound of carotid - ASA 81 daily -d/c planing 02/10/2015, 3:30 PM

## 2015-02-10 NOTE — Plan of Care (Signed)
Problem: Discharge/Transitional Outcomes Goal: PCP appointment made and transportation plan in place Outcome: Progressing Pt alert and oriented, slight weakness of the left side. No complaints of pain or discomfort at this time. CT complete.

## 2015-02-10 NOTE — Plan of Care (Signed)
Problem: Discharge/Transitional Outcomes Goal: PCP appointment made and transportation plan in place Outcome: Progressing Plan of care progress to goal for: CVA - Neuro assessments WNL. - Complained of pain, tylenol given with improvement. - Will continue to monitor.

## 2015-02-15 ENCOUNTER — Ambulatory Visit: Payer: Medicare Other | Attending: Internal Medicine | Admitting: Occupational Therapy

## 2015-02-15 DIAGNOSIS — R279 Unspecified lack of coordination: Secondary | ICD-10-CM | POA: Insufficient documentation

## 2015-02-15 DIAGNOSIS — IMO0002 Reserved for concepts with insufficient information to code with codable children: Secondary | ICD-10-CM

## 2015-02-15 DIAGNOSIS — I6980 Unspecified sequelae of other cerebrovascular disease: Secondary | ICD-10-CM | POA: Insufficient documentation

## 2015-02-15 NOTE — Patient Instructions (Signed)
HEP for [facility] - thumb<> fingers  And in hand manipulation activities Putty -gripping, lat and 3 point grip , twisting and pulling

## 2015-02-15 NOTE — Therapy (Signed)
[facility] [address] [place], [address] Phone: [phone]   Fax:  [phone]  Occupational Therapy Treatment  Patient Details  Name: Erica Levy MRN: [id number] Date of Birth: [birth year] Referring Provider:  Loletha Grayer, MD  Encounter Date: 02/15/2015      OT End of Session - 02/15/15 1749    Visit Number 1   Number of Visits 1   Date for OT Re-Evaluation 03/01/15   OT Start Time 1345   OT Stop Time 1430   OT Time Calculation (min) 45 min   Activity Tolerance Patient tolerated treatment well   Behavior During Therapy [facility] for tasks assessed/performed      Past Medical History  Diagnosis Date  . Hypertension   . Hypothyroid   . Hypercholesteremia   . DM (diabetes mellitus)     diet controlled  . Generalized OA   . Osteoporosis   . Migraine   . GERD (gastroesophageal reflux disease)     Past Surgical History  Procedure Laterality Date  . Tonsillectomy    . Eye surgery    . Cataract extraction    . Retinal detachment surgery      There were no vitals filed for this visit.  Visit Diagnosis:  Lack of coordination due to stroke - Plan: Ot plan of care cert/re-cert      Subjective Assessment - 02/15/15 1733    Subjective  I was not sure if I need to come - not sure if I still need therapy   Patient Stated Goals They refer me when I left the hospital to you for my L hand coordination    Currently in Pain? No/denies            [facility] OT Assessment - 02/15/15 0001    Assessment   Diagnosis Acute CVA    Onset Date 02/09/15   Assessment Pt was kept over night , refer by OT for outpt for incoordination of L hand    Prior Therapy acute care   Home  Environment   Lives With Spouse   Prior Function   Leisure likes to play on computer, some reading . grand and great grand kids    Coordination   9 Hole Peg Test Right;Left   Right 9 Hole Peg Test 28   Left 9 Hole Peg  Test 35   Other Modify [place] test R 13 sec, L 23sec   Coordination Slower with in hand manupulation    AROM   Overall AROM Comments AROM of bilateral shoulder, elbow nad wrist WNL    Strength   Overall Strength Comments M/M strength 5/5 shoulders to wrist    Right Hand Grip (lbs) 52   Right Hand Lateral Pinch 17 lbs   Right Hand 3 Point Pinch 17 lbs   Left Hand Grip (lbs) 44   Left Hand Lateral Pinch 14 lbs   Left Hand 3 Point Pinch 13 lbs                          OT Education - 02/15/15 1748    Education provided Yes   Education Details HEP   Person(s) Educated Patient   Methods Explanation;Demonstration;Verbal cues;Handout   Comprehension Verbalized understanding;Returned demonstration;Verbal cues required          OT Short Term Goals - 02/15/15 1753    OT SHORT TERM GOAL #1   Title Grip and 3  point grip improve with 2 lbs to report  less dropping objects    Time 2   Period Weeks   Status New           OT Long Term Goals - 03/10/15 1754    OT LONG TERM GOAL #1   Title Modiy [place] coordination test improve with 5 sec    Baseline 23 min L, 13 min R    Time 2   Period Weeks   Status New               Plan - 2015-03-10 1749    Clinical Impression Statement Pt present just under a week from discharge in hospital with Acute CVA - pt do show some decrease 3 point grip, and full grip as well as in hand coordination - pt report dropping some objects -but other wise can do most everything - she is R hand dominant - pt was provided HEP to cont with for week and return if needed    Pt will benefit from skilled therapeutic intervention in order to improve on the following deficits (Retired) Decreased coordination;Decreased strength   Rehab Potential Excellent   OT Frequency 1x / week   OT Duration 2 weeks   OT Treatment/Interventions Therapeutic exercise;Neuromuscular education   Plan Pt to return in week of doing HEP - phone if improve    OT Home Exercise Plan pt instructions   Consulted and Agree with Plan of Care Patient     Review and provided with pt HEP for coordination , and grip /prehension strength      G-Codes - Mar 10, 2015 1755    Functional Assessment Tool Used ROM, strength, 9 hole peg test, modiy [place] coordination test , clinical judgement   Functional Limitation Self care   Self Care Current Status (E1007) At least 1 percent but less than 20 percent impaired, limited or restricted   Self Care Goal Status (H2197) 0 percent impaired, limited or restricted      Problem List Patient Active Problem List   Diagnosis Date Noted  . CVA (cerebral infarction) 02/09/2015    Rosalyn Gess OTR/L, CLT Mar 10, 2015, 6:01 PM  [facility] [address], [place], [address] Phone: [phone]   Fax:  [phone]

## 2015-02-21 ENCOUNTER — Encounter: Payer: Medicare Other | Admitting: Occupational Therapy

## 2015-02-23 ENCOUNTER — Ambulatory Visit: Payer: Medicare Other | Admitting: Occupational Therapy

## 2015-06-01 ENCOUNTER — Emergency Department: Payer: Medicare Other

## 2015-06-01 ENCOUNTER — Encounter: Payer: Self-pay | Admitting: Urgent Care

## 2015-06-01 DIAGNOSIS — R51 Headache: Secondary | ICD-10-CM | POA: Diagnosis present

## 2015-06-01 DIAGNOSIS — Z79899 Other long term (current) drug therapy: Secondary | ICD-10-CM | POA: Insufficient documentation

## 2015-06-01 DIAGNOSIS — I10 Essential (primary) hypertension: Secondary | ICD-10-CM | POA: Insufficient documentation

## 2015-06-01 DIAGNOSIS — E119 Type 2 diabetes mellitus without complications: Secondary | ICD-10-CM | POA: Diagnosis not present

## 2015-06-01 DIAGNOSIS — Z7982 Long term (current) use of aspirin: Secondary | ICD-10-CM | POA: Insufficient documentation

## 2015-06-01 NOTE — ED Notes (Signed)
Patient presents with c/o a frontal headache since last Friday; some posterior neck pain as well. Patient denies other symptoms. No focal neuro deficits noted in triage on exam. Patient was seen by PCP Dema Severin, PA at [facility] in [place]) yesterday; Dx'd with a sinus headache and advised to take Claritin.

## 2015-06-02 ENCOUNTER — Emergency Department
Admission: EM | Admit: 2015-06-02 | Discharge: 2015-06-02 | Disposition: A | Discharge status: Home / Self Care | Payer: Medicare Other | Attending: Emergency Medicine | Admitting: Emergency Medicine

## 2015-06-02 DIAGNOSIS — R519 Headache, unspecified: Secondary | ICD-10-CM

## 2015-06-02 DIAGNOSIS — R51 Headache: Secondary | ICD-10-CM

## 2015-06-02 HISTORY — DX: Transient cerebral ischemic attack, unspecified: G45.9

## 2015-06-02 LAB — GLUCOSE, CAPILLARY: GLUCOSE-CAPILLARY: 114 mg/dL — AB (ref 65–99)

## 2015-06-02 MED ORDER — SODIUM CHLORIDE 0.9 % IV BOLUS (SEPSIS)
500.0000 mL | Freq: Once | INTRAVENOUS | Status: AC
  Administered 2015-06-02: 500 mL via INTRAVENOUS

## 2015-06-02 MED ORDER — METOCLOPRAMIDE HCL 5 MG/ML IJ SOLN
10.0000 mg | Freq: Once | INTRAMUSCULAR | Status: AC
  Administered 2015-06-02: 10 mg via INTRAVENOUS
  Filled 2015-06-02: qty 2

## 2015-06-02 NOTE — ED Notes (Signed)

## 2015-06-02 NOTE — ED Provider Notes (Signed)
[facility] Emergency Department Provider Note REMINDER - THIS NOTE IS NOT A FINAL MEDICAL RECORD UNTIL IT IS SIGNED. UNTIL THEN, THE CONTENT BELOW MAY REFLECT INFORMATION FROM A DOCUMENTATION TEMPLATE, NOT THE ACTUAL PATIENT VISIT. ____________________________________________  Time seen: Approximately 2:00 AM  I have reviewed the triage vital signs and the nursing notes.   HISTORY  Chief Complaint Headache    HPI Erica Levy is a 74 y.o. female who reports a history of high blood pressure, high cholesterol, diabetes and migraines as well as a small TIA.  Patient reports that for the last week she's had an on-and-off headache, it came on slowly and is described as a throbbing sensation across the top of her head. She denies any vision changes, numbness, tingling, occasionally the pain is in the back of her scalp as well.  No numbness or weakness, no trouble speaking, no facial droop. No trouble walking. She reports she had a "TIA" about a year ago and the symptoms are nothing similar. She does, however have a long history of headaches and states this is typical of her "migraines" but usually they only last a couple of days and this time it's been lasting off and on for about a week.  At the present time she reports her symptoms are mild, throbbing sensation across the scalp. No neck pain. No fevers or chills. No tick bites, fevers, or recent illness aside from possibly having a "sinus headache" diagnosed and advised to take antihistamine medication. The patient denies any runny nose, cough or congestion. No trouble breathing. No abdominal pain.   No pain with chewing. No jaw pain. No chest pain.  Past Medical History  Diagnosis Date  . Hypertension   . Hypothyroid   . Hypercholesteremia   . DM (diabetes mellitus) ([place])     diet controlled  . Generalized OA   . Osteoporosis   . Migraine   . GERD (gastroesophageal reflux disease)   . TIA  (transient ischemic attack)     July    Patient Active Problem List   Diagnosis Date Noted  . CVA (cerebral infarction) 02/09/2015    Past Surgical History  Procedure Laterality Date  . Tonsillectomy    . Eye surgery    . Cataract extraction    . Retinal detachment surgery      Current Outpatient Rx  Name  Route  Sig  Dispense  Refill  . aspirin EC 81 MG EC tablet   Oral   Take 1 tablet (81 mg total) by mouth daily.   30 tablet   5   . atorvastatin (LIPITOR) 80 MG tablet   Oral   Take 1 tablet (80 mg total) by mouth daily at 6 PM.   30 tablet   0   . levothyroxine (SYNTHROID, LEVOTHROID) 50 MCG tablet   Oral   Take 1 tablet by mouth every morning.         . phenyltoloxamine-acetaminophen 30-325 MG per tablet   Oral   Take 1 tablet by mouth every 4 (four) hours as needed for pain.         . Vitamin D, Ergocalciferol, (DRISDOL) 50000 UNITS CAPS capsule   Oral   Take 50,000 Units by mouth every 7 (seven) days.           Allergies Review of patient's allergies indicates no known allergies.  Family History  Problem Relation Age of Onset  . Hypertension Mother   . Osteoarthritis Mother   .  Dementia Father     Social History Social History  Substance Use Topics  . Smoking status: Never Smoker   . Smokeless tobacco: None  . Alcohol Use: No    Review of Systems Constitutional: No fever/chills Eyes: No visual changes. ENT: No sore throat. Cardiovascular: Denies chest pain. Respiratory: Denies shortness of breath. Gastrointestinal: No abdominal pain.  No nausea, no vomiting.  No diarrhea.  No constipation. Genitourinary: Negative for dysuria. Musculoskeletal: Negative for back pain. Skin: Negative for rash. Neurological: Negative for  focal weakness or numbness.  10-point ROS otherwise negative.  ____________________________________________   PHYSICAL EXAM:  VITAL SIGNS: ED Triage Vitals  Enc Vitals Group     BP 06/01/15 2254 157/73 mmHg      Pulse Rate 06/01/15 2254 85     Resp 06/01/15 2254 20     Temp 06/01/15 2254 98.2 F (36.8 C)     Temp Source 06/01/15 2254 Oral     SpO2 06/01/15 2254 97 %     Weight 06/01/15 2254 153 lb (69.4 kg)     Height 06/01/15 2254 5\' 1"  (1.549 m)     Head Cir --      Peak Flow --      Pain Score 06/01/15 2257 8     Pain Loc --      Pain Edu? --      Excl. in [place]? --    Constitutional: Alert and oriented. Well appearing and in no acute distress. Eyes: Conjunctivae are normal. PERRL. EOMI. Head: Atraumatic. Nose: No congestion/rhinnorhea. Mouth/Throat: Mucous membranes are moist.  Oropharynx non-erythematous. Neck: No stridor.  No meningismus. No carotid bruits. Cardiovascular: Normal rate, regular rhythm. Grossly normal heart sounds.  Good peripheral circulation. Respiratory: Normal respiratory effort.  No retractions. Lungs CTAB. Gastrointestinal: Soft and nontender. No distention. No abdominal bruits. No CVA tenderness. Musculoskeletal: No lower extremity tenderness nor edema.  No joint effusions. Neurologic:  Normal speech and language. No gross focal neurologic deficits are appreciated.  The patient has no pronator drift. The patient has normal cranial nerve exam. Extraocular movements are normal. Visual fields are normal. Patient has 5 out of 5 strength in all extremities. There is no numbness or gross, acute sensory abnormality in the extremities bilaterally. No speech disturbance. No dysarthria. No aphasia. No ataxia. Normal finger nose finger bilat. Patient speaking in full and clear sentences. No temporal artery tenderness. patient has normal and equal temporal artery pulsations bilaterally.   Skin:  Skin is warm, dry and intact. No rash noted. Psychiatric: Mood and affect are normal. Speech and behavior are normal.  ____________________________________________   LABS (all labs ordered are listed, but only abnormal results are displayed)  Labs Reviewed   GLUCOSE, CAPILLARY - Abnormal; Notable for the following:    Glucose-Capillary 114 (*)    All other components within normal limits  CBG MONITORING, ED   ____________________________________________  EKG   ____________________________________________  RADIOLOGY  CT Head Wo Contrast (Final result) Result time: 06/01/15 23:22:50   Final result by Rad Results In Interface (06/01/15 23:22:50)   Narrative:   CLINICAL DATA: Frontal headache for 5 days. Seen by primary care physician and diagnosed with sinus disease. No relief with the prescribed medication.  EXAM: CT HEAD WITHOUT CONTRAST  TECHNIQUE: Contiguous axial images were obtained from the base of the skull through the vertex without intravenous contrast.  COMPARISON: 02/09/2015  FINDINGS: There is no intracranial hemorrhage, mass or evidence of acute infarction. There is no extra-axial fluid collection. Pearline Cables  matter and white matter appear normal. Cerebral volume is normal for age. Brainstem and posterior fossa are unremarkable. The CSF spaces appear normal.  The bony structures are intact. The visible portions of the paranasal sinuses are clear.  IMPRESSION: Normal brain. Visible paranasal sinuses are clear.    ____________________________________________   PROCEDURES  Procedure(s) performed: None  Critical Care performed: No  ____________________________________________   INITIAL IMPRESSION / ASSESSMENT AND PLAN / ED COURSE  Pertinent labs & imaging results that were available during my care of the patient were reviewed by me and considered in my medical decision making (see chart for details).  Patient presents with recurrent headache. She evidently has a history of the same headaches previously that will come and go. She's been taking over-the-counter medications with little relief. At present her symptoms are mild, and we discussed the risks and benefits of treatment with medication such as  Reglan including discussion of common side effects. After discussion the patient is agreeable with trying this to help break the cycle of her headaches. She has no significant signs or symptoms to suggest acute neurologic normality, her history does not seem to be consistent with that of an aneurysm, or infectious etiology. I most suspect that she likely has a recurrence of her chronic headaches, just that she is describing this is a somewhat chronic process with very similar headaches in the past.  I see no further evidence for ongoing testing or MR imaging. At this point, we will treat her with Reglan and fluids and reassess. I did discuss with her careful return precautions as well as discussed with her signs and symptoms of stroke, severe headache, fever, neck pain, numbness, tingling, weakness, facial droop and she agrees to come back to ER right away if any of these symptoms arise.  ----------------------------------------- 2:53 AM on 06/02/2015 -----------------------------------------  Patient reports her headache is much improved, she is in no distress and has stable neurologic exam. Reviewed discharge instructions. Patient husband will be driving home. ____________________________________________   FINAL CLINICAL IMPRESSION(S) / ED DIAGNOSES  Final diagnoses:  Nonintractable episodic headache, unspecified headache type      Delman Kitten, MD 06/02/15 [phone]

## 2015-06-02 NOTE — ED Notes (Signed)
Patient reports that she is feeling better s/p IVFs and Reglan. MD made aware. MD to present to bedside to speak with patient regarding disposition.

## 2015-06-02 NOTE — Discharge Instructions (Signed)
You have been seen in the Emergency Department (ED) for a headache.  Please use Tylenol  as needed for symptoms, but only as written on the box.  As we have discussed, please follow up with your primary care doctor as soon as possible regarding todays Emergency Department (ED) visit and your headache symptoms.    Call your doctor or return to the ED if you have a worsening headache, sudden and severe headache, confusion, slurred speech, facial droop, weakness or numbness in any arm or leg, extreme fatigue, vision problems, or other symptoms that concern you.   General Headache Without Cause A headache is pain or discomfort felt around the head or neck area. The specific cause of a headache may not be found. There are many causes and types of headaches. A few common ones are:  Tension headaches.  Migraine headaches.  Cluster headaches.  Chronic daily headaches. HOME CARE INSTRUCTIONS  Watch your condition for any changes. Take these steps to help with your condition: Managing Pain  Take over-the-counter and prescription medicines only as told by your health care provider.  Lie down in a dark, quiet room when you have a headache.  If directed, apply ice to the head and neck area:  Put ice in a plastic bag.  Place a towel between your skin and the bag.  Leave the ice on for 20 minutes, 2-3 times per day.  Use a heating pad or hot shower to apply heat to the head and neck area as told by your health care provider.  Keep lights dim if bright lights bother you or make your headaches worse. Eating and Drinking  Eat meals on a regular schedule.  Limit alcohol use.  Decrease the amount of caffeine you drink, or stop drinking caffeine. General Instructions  Keep all follow-up visits as told by your health care provider. This is important.  Keep a headache journal to help find out what may trigger your headaches. For example, write down:  What you eat and drink.  How much  sleep you get.  Any change to your diet or medicines.  Try massage or other relaxation techniques.  Limit stress.  Sit up straight, and do not tense your muscles.  Do not use tobacco products, including cigarettes, chewing tobacco, or e-cigarettes. If you need help quitting, ask your health care provider.  Exercise regularly as told by your health care provider.  Sleep on a regular schedule. Get 7-9 hours of sleep, or the amount recommended by your health care provider. SEEK MEDICAL CARE IF:   Your symptoms are not helped by medicine.  You have a headache that is different from the usual headache.  You have nausea or you vomit.  You have a fever. SEEK IMMEDIATE MEDICAL CARE IF:   Your headache becomes severe.  You have repeated vomiting.  You have a stiff neck.  You have a loss of vision.  You have problems with speech.  You have pain in the eye or ear.  You have muscular weakness or loss of muscle control.  You lose your balance or have trouble walking.  You feel faint or pass out.  You have confusion.   This information is not intended to replace advice given to you by your health care provider. Make sure you discuss any questions you have with your health care provider.   Document Released: 07/16/2005 Document Revised: 04/06/2015 Document Reviewed: 11/08/2014 Elsevier Interactive Patient Education [employer].

## 2015-11-09 ENCOUNTER — Other Ambulatory Visit: Payer: Self-pay | Admitting: Family Medicine

## 2015-11-09 ENCOUNTER — Ambulatory Visit
Admission: RE | Admit: 2015-11-09 | Discharge: 2015-11-09 | Disposition: A | Discharge status: Home / Self Care | Payer: Medicare Other | Source: Ambulatory Visit | Attending: Family Medicine | Admitting: Family Medicine

## 2015-11-09 DIAGNOSIS — Z1231 Encounter for screening mammogram for malignant neoplasm of breast: Secondary | ICD-10-CM

## 2015-11-10 ENCOUNTER — Ambulatory Visit
Admission: RE | Admit: 2015-11-10 | Discharge: 2015-11-10 | Disposition: A | Discharge status: Home / Self Care | Payer: Medicare Other | Source: Ambulatory Visit | Attending: Family Medicine | Admitting: Family Medicine

## 2015-11-10 ENCOUNTER — Other Ambulatory Visit: Payer: Self-pay | Admitting: Family Medicine

## 2015-11-10 DIAGNOSIS — M5136 Other intervertebral disc degeneration, lumbar region: Secondary | ICD-10-CM | POA: Diagnosis not present

## 2015-11-10 DIAGNOSIS — M5442 Lumbago with sciatica, left side: Secondary | ICD-10-CM | POA: Insufficient documentation

## 2015-11-10 DIAGNOSIS — Z1231 Encounter for screening mammogram for malignant neoplasm of breast: Secondary | ICD-10-CM | POA: Diagnosis not present

## 2015-11-16 ENCOUNTER — Other Ambulatory Visit: Payer: Self-pay | Admitting: Family Medicine

## 2015-11-16 DIAGNOSIS — R928 Other abnormal and inconclusive findings on diagnostic imaging of breast: Secondary | ICD-10-CM

## 2015-11-30 ENCOUNTER — Ambulatory Visit
Admission: RE | Admit: 2015-11-30 | Discharge: 2015-11-30 | Disposition: A | Discharge status: Home / Self Care | Payer: Medicare Other | Source: Ambulatory Visit | Attending: Family Medicine | Admitting: Family Medicine

## 2015-11-30 DIAGNOSIS — N63 Unspecified lump in breast: Secondary | ICD-10-CM | POA: Insufficient documentation

## 2015-11-30 DIAGNOSIS — R928 Other abnormal and inconclusive findings on diagnostic imaging of breast: Secondary | ICD-10-CM

## 2015-11-30 DIAGNOSIS — R921 Mammographic calcification found on diagnostic imaging of breast: Secondary | ICD-10-CM | POA: Insufficient documentation

## 2015-12-05 ENCOUNTER — Other Ambulatory Visit: Payer: Self-pay | Admitting: Family Medicine

## 2015-12-05 DIAGNOSIS — R920 Mammographic microcalcification found on diagnostic imaging of breast: Secondary | ICD-10-CM

## 2015-12-05 DIAGNOSIS — N63 Unspecified lump in unspecified breast: Secondary | ICD-10-CM

## 2015-12-15 ENCOUNTER — Ambulatory Visit
Admission: RE | Admit: 2015-12-15 | Discharge: 2015-12-15 | Disposition: A | Discharge status: Home / Self Care | Payer: Medicare Other | Source: Ambulatory Visit | Attending: Family Medicine | Admitting: Family Medicine

## 2015-12-15 DIAGNOSIS — D0512 Intraductal carcinoma in situ of left breast: Secondary | ICD-10-CM | POA: Diagnosis not present

## 2015-12-15 DIAGNOSIS — N63 Unspecified lump in unspecified breast: Secondary | ICD-10-CM

## 2015-12-15 DIAGNOSIS — R921 Mammographic calcification found on diagnostic imaging of breast: Secondary | ICD-10-CM | POA: Insufficient documentation

## 2015-12-15 DIAGNOSIS — R920 Mammographic microcalcification found on diagnostic imaging of breast: Secondary | ICD-10-CM

## 2015-12-15 HISTORY — PX: BREAST BIOPSY: SHX20

## 2015-12-16 LAB — SURGICAL PATHOLOGY

## 2015-12-19 LAB — SURGICAL PATHOLOGY

## 2015-12-22 ENCOUNTER — Encounter: Payer: Self-pay | Admitting: *Deleted

## 2015-12-22 NOTE — Progress Notes (Signed)
  Oncology Nurse Navigator Documentation  Navigator Location: [facility] (12/22/15 1000) Navigator Encounter Type: Introductory phone call (12/22/15 1000)   Abnormal Finding Date: 11/30/15 (12/22/15 1000) Confirmed Diagnosis Date: 12/16/15 (12/22/15 1000)         Barriers/Navigation Needs: Education;Coordination of Care (12/22/15 1000)   Interventions: Coordination of Care (12/22/15 1000)            Acuity: Level 2 (12/22/15 1000)         Time Spent with Patient: 60 (12/22/15 1000)   Called and talked with patient today.  I have scheduled her to see Dr. Grayland Ormond on 12/27/15 @ 8:30 and Dr. Rochel Brome on 12/27/15 @ 4:30.  She would like to pick up her educational literature at that time.  She is to call if she has any questions or needs.

## 2015-12-27 ENCOUNTER — Encounter: Payer: Self-pay | Admitting: *Deleted

## 2015-12-27 ENCOUNTER — Inpatient Hospital Stay: Payer: Medicare Other | Attending: Oncology | Admitting: Oncology

## 2015-12-27 VITALS — BP 146/79 | HR 93 | Temp 97.9°F | Resp 16 | Ht 62.8 in | Wt 158.5 lb

## 2015-12-27 DIAGNOSIS — Z7982 Long term (current) use of aspirin: Secondary | ICD-10-CM | POA: Insufficient documentation

## 2015-12-27 DIAGNOSIS — E7800 Pure hypercholesterolemia, unspecified: Secondary | ICD-10-CM | POA: Diagnosis not present

## 2015-12-27 DIAGNOSIS — Z171 Estrogen receptor negative status [ER-]: Secondary | ICD-10-CM | POA: Insufficient documentation

## 2015-12-27 DIAGNOSIS — K219 Gastro-esophageal reflux disease without esophagitis: Secondary | ICD-10-CM | POA: Insufficient documentation

## 2015-12-27 DIAGNOSIS — Z8673 Personal history of transient ischemic attack (TIA), and cerebral infarction without residual deficits: Secondary | ICD-10-CM | POA: Diagnosis not present

## 2015-12-27 DIAGNOSIS — I10 Essential (primary) hypertension: Secondary | ICD-10-CM | POA: Diagnosis not present

## 2015-12-27 DIAGNOSIS — F419 Anxiety disorder, unspecified: Secondary | ICD-10-CM | POA: Diagnosis not present

## 2015-12-27 DIAGNOSIS — C50212 Malignant neoplasm of upper-inner quadrant of left female breast: Secondary | ICD-10-CM | POA: Diagnosis present

## 2015-12-27 DIAGNOSIS — Z803 Family history of malignant neoplasm of breast: Secondary | ICD-10-CM | POA: Diagnosis not present

## 2015-12-27 DIAGNOSIS — E039 Hypothyroidism, unspecified: Secondary | ICD-10-CM | POA: Insufficient documentation

## 2015-12-27 DIAGNOSIS — E119 Type 2 diabetes mellitus without complications: Secondary | ICD-10-CM | POA: Diagnosis not present

## 2015-12-27 DIAGNOSIS — Z79899 Other long term (current) drug therapy: Secondary | ICD-10-CM | POA: Insufficient documentation

## 2015-12-27 NOTE — Progress Notes (Signed)
Oncology Nurse Navigator Documentation  Oncology Nurse Navigator Flowsheets 12/22/2015 12/27/2015  Navigator Location [facility]  Navigator Encounter Type Introductory phone call Initial MedOnc  Abnormal Finding Date 11/30/2015 -  Confirmed Diagnosis Date 12/16/2015 -  Barriers/Navigation Needs Education;Coordination of Care Education  Education - Understanding Cancer/ Treatment Options;Coping with Diagnosis/ Prognosis;Newly Diagnosed Cancer Education  Interventions Coordination of Care Education Method  Acuity Level 2 Level 2  Time Spent with Patient 55 60   Met patient, her husband and her son today during her initial medical oncology visit.  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  Patient discussed lumpectomy vs mastectomy and possibility of neoadjuvant chemotherapy with Dr. Grayland Ormond.  She has an appointment today with Dr. Tamala Julian.  She is to call and let me know her plan.

## 2015-12-29 ENCOUNTER — Other Ambulatory Visit: Payer: Self-pay | Admitting: Surgery

## 2015-12-29 DIAGNOSIS — C50812 Malignant neoplasm of overlapping sites of left female breast: Secondary | ICD-10-CM

## 2015-12-30 ENCOUNTER — Encounter: Payer: Self-pay | Admitting: *Deleted

## 2015-12-30 NOTE — Progress Notes (Signed)
  Oncology Nurse Navigator Documentation  Navigator Location: [facility] (12/30/15 1000) Navigator Encounter Type: Telephone (12/30/15 1000) Telephone: Outgoing Call (12/30/15 1000)                              Acuity: Level 2 (12/30/15 1000)         Time Spent with Patient: 15 (12/30/15 1000)   Called patient to see if she has any questions or needs.  Surgery scheduled for June 16th.  No needs at this time.

## 2016-01-05 ENCOUNTER — Encounter
Admission: RE | Admit: 2016-01-05 | Discharge: 2016-01-05 | Disposition: A | Discharge status: Home / Self Care | Payer: Medicare Other | Source: Ambulatory Visit | Attending: Surgery | Admitting: Surgery

## 2016-01-05 DIAGNOSIS — Z01812 Encounter for preprocedural laboratory examination: Secondary | ICD-10-CM | POA: Insufficient documentation

## 2016-01-05 HISTORY — DX: Cerebral infarction, unspecified: I63.9

## 2016-01-05 LAB — BASIC METABOLIC PANEL
ANION GAP: 8 (ref 5–15)
BUN: 21 mg/dL — ABNORMAL HIGH (ref 6–20)
CHLORIDE: 102 mmol/L (ref 101–111)
CO2: 27 mmol/L (ref 22–32)
Calcium: 9.7 mg/dL (ref 8.9–10.3)
Creatinine, Ser: 0.97 mg/dL (ref 0.44–1.00)
GFR calc non Af Amer: 57 mL/min — ABNORMAL LOW (ref >60)
Glucose, Bld: 117 mg/dL — ABNORMAL HIGH (ref 65–99)
Potassium: 3.7 mmol/L (ref 3.5–5.1)
Sodium: 137 mmol/L (ref 135–145)

## 2016-01-05 LAB — HEPATIC FUNCTION PANEL
ALT: 39 U/L (ref 14–54)
AST: 38 U/L (ref 15–41)
Albumin: 4.6 g/dL (ref 3.5–5.0)
Alkaline Phosphatase: 119 U/L (ref 38–126)
BILIRUBIN DIRECT: 0.2 mg/dL (ref 0.1–0.5)
Indirect Bilirubin: 1 mg/dL — ABNORMAL HIGH (ref 0.3–0.9)
TOTAL PROTEIN: 7.7 g/dL (ref 6.5–8.1)
Total Bilirubin: 1.2 mg/dL (ref 0.3–1.2)

## 2016-01-05 LAB — CBC
HCT: 41.7 % (ref 35.0–47.0)
Hemoglobin: 14.6 g/dL (ref 12.0–16.0)
MCH: 30.8 pg (ref 26.0–34.0)
MCHC: 35 g/dL (ref 32.0–36.0)
MCV: 87.9 fL (ref 80.0–100.0)
PLATELETS: 225 10*3/uL (ref 150–440)
RBC: 4.75 MIL/uL (ref 3.80–5.20)
RDW: 13.6 % (ref 11.5–14.5)
WBC: 5.9 10*3/uL (ref 3.6–11.0)

## 2016-01-05 NOTE — Patient Instructions (Signed)
  Your procedure is scheduled on January 13, 2016 (Friday) Report to [facility] (Radiology Desk) Second desk on right To find out your arrival time please call [phone] between 1PM - 3PM on Arrival time 9:30 AM  Remember: Instructions that are not followed completely may result in serious medical risk, up to and including death, or upon the discretion of your surgeon and anesthesiologist your surgery may need to be rescheduled.    _x___ 1. Do not eat food or drink liquids after midnight. No gum chewing or hard candies.     __x__ 2. No Alcohol for 24 hours before or after surgery.   ____ 3. Bring all medications with you on the day of surgery if instructed.    __x__ 4. Notify your doctor if there is any change in your medical condition     (cold, fever, infections).     Do not wear jewelry, make-up, hairpins, clips or nail polish.  Do not wear lotions, powders, or perfumes. You may wear deodorant.  Do not shave 48 hours prior to surgery. Men may shave face and neck.  Do not bring valuables to the hospital.    [facility] is not responsible for any belongings or valuables.               Contacts, dentures or bridgework may not be worn into surgery.  Leave your suitcase in the car. After surgery it may be brought to your room.  For patients admitted to the hospital, discharge time is determined by your treatment team.   Patients discharged the day of surgery will not be allowed to drive home.    Please read over the following fact sheets that you were given:   [facility] Preparing for Surgery and or MRSA Information   _x___ Take these medicines the morning of surgery with A SIP OF WATER:    1. Levothyroxine  2.  3.  4.  5.  6.  ____ Fleet Enema (as directed)   _x___ Use CHG Soap or sage wipes as directed on instruction sheet   ____ Use inhalers on the day of surgery and bring to hospital day of surgery  ____ Stop metformin 2 days prior to surgery    ____ Take 1/2 of  usual insulin dose the night before surgery and none on the morning of  surgery          __x__ Stop aspirin or coumadin, or plavix (Stop Aspirin seven days prior to surgery)  _x__ Stop Anti-inflammatories such as Advil, Aleve, Ibuprofen, Motrin, Naproxen,          Naprosyn, Goodies powders or aspirin products. Ok to take Tylenol.   ____ Stop supplements until after surgery.    ____ Bring C-Pap to the hospital.

## 2016-01-06 DIAGNOSIS — C50212 Malignant neoplasm of upper-inner quadrant of left female breast: Secondary | ICD-10-CM | POA: Insufficient documentation

## 2016-01-06 NOTE — Progress Notes (Signed)
.  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 Chief Complaint  Patient presents with  . New Evaluation    DCIS    INTERVAL HISTORY: Patient is a 75 year old female who was found to have multiple abnormalities in her left breast on routine screening mammogram. 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 She currently feels well and is asymptomatic. She has mild tenderness at the site of her biopsy. She has no neurologic complaints. She denies any recent fevers or illnesses. She has a good appetite and denies weight loss. She denies any pain. She denies any chest pain or shortness of breath. She denies any nausea, vomiting, constipation, or diarrhea. She has no urinary complaints. Patient otherwise feels well and offers no further specific complaints.  REVIEW OF SYSTEMS:   Review of Systems  Constitutional: Negative.  Negative for fever, weight loss and malaise/fatigue.  Respiratory: Negative.  Negative for cough and shortness of breath.   Cardiovascular: Negative.  Negative for chest pain.  Gastrointestinal: Negative.   Genitourinary: Negative.   Musculoskeletal: Negative.   Neurological: Negative.  Negative for weakness.  Psychiatric/Behavioral: The patient is nervous/anxious.     As per HPI. Otherwise, a complete review of systems is negatve.  PAST MEDICAL HISTORY: Past Medical History  Diagnosis Date  . Hypertension   . Hypothyroid   . Hypercholesteremia   . DM (diabetes mellitus) ([place])     diet controlled  . Generalized OA   . Osteoporosis   . Migraine   . GERD (gastroesophageal reflux disease)   . TIA (transient ischemic attack)    July  . Stroke [facility])     PAST SURGICAL HISTORY: Past Surgical History  Procedure Laterality Date  . Tonsillectomy    . Cataract extraction    . Retinal detachment surgery Right     Dr. Starling Manns, [facility]  . Breast biopsy Left     bx/clip-neg  . Breast biopsy Left 12/15/2015    path pending/us and stereo bx  . Eye surgery Right     Cataract Extraction with IOL  . Dilation and curettage of uterus      FAMILY HISTORY Family History  Problem Relation Age of Onset  . Hypertension Mother   . Osteoarthritis Mother   . Dementia Father   . Breast cancer Cousin 8    mat cousin       ADVANCED DIRECTIVES:    HEALTH MAINTENANCE: Social History  Substance Use Topics  . Smoking status: Never Smoker   . Smokeless tobacco: Never Used  . Alcohol Use: No     Colonoscopy:  PAP:  Bone density:  Lipid panel:  No Known Allergies  Current Outpatient Prescriptions  Medication Sig Dispense Refill  . aspirin EC 81 MG EC tablet Take 1 tablet (81 mg total) by mouth daily. 30 tablet 5  . atorvastatin (LIPITOR) 80 MG tablet Take 1 tablet (80 mg total) by mouth daily at 6 PM. 30 tablet 0  . hydrochlorothiazide (HYDRODIURIL) 12.5 MG tablet Take 12.5 mg by mouth daily.     . Vitamin D, Ergocalciferol, (DRISDOL) 50000 UNITS CAPS capsule Take 2,000 Units by mouth daily.     Marland Kitchen levothyroxine (SYNTHROID, LEVOTHROID) 50 MCG tablet  50 mcg daily before breakfast.      No current facility-administered medications for this visit.    OBJECTIVE: Filed Vitals:   12/27/15 0858  BP: 146/79  Pulse: 93  Temp: 97.9 F (36.6 C)  Resp: 16     Body mass index is 28.26 kg/(m^2).    ECOG FS:0 - Asymptomatic  General: Well-developed, well-nourished, no acute distress. Eyes: Pink conjunctiva, anicteric sclera. HEENT: Normocephalic, moist mucous membranes, clear oropharnyx. Breasts: Patient requested exam be deferred today. Lungs: Clear to auscultation bilaterally. Heart: Regular rate and rhythm. No  rubs, murmurs, or gallops. Abdomen: Soft, nontender, nondistended. No organomegaly noted, normoactive bowel sounds. Musculoskeletal: No edema, cyanosis, or clubbing. Neuro: Alert, answering all questions appropriately. Cranial nerves grossly intact. Skin: No rashes or petechiae noted. Psych: Normal affect. Lymphatics: No cervical, calvicular, axillary or inguinal LAD.   LAB RESULTS:  Lab Results  Component Value Date   NA 137 01/05/2016   K 3.7 01/05/2016   CL 102 01/05/2016   CO2 27 01/05/2016   GLUCOSE 117* 01/05/2016   BUN 21* 01/05/2016   CREATININE 0.97 01/05/2016   CALCIUM 9.7 01/05/2016   PROT 7.7 01/05/2016   ALBUMIN 4.6 01/05/2016   AST 38 01/05/2016   ALT 39 01/05/2016   ALKPHOS 119 01/05/2016   BILITOT 1.2 01/05/2016   GFRNONAA 57* 01/05/2016   GFRAA >60 01/05/2016    Lab Results  Component Value Date   WBC 5.9 01/05/2016   HGB 14.6 01/05/2016   HCT 41.7 01/05/2016   MCV 87.9 01/05/2016   PLT 225 01/05/2016     STUDIES: Mm Digital Diagnostic Unilat L  12/15/2015  CLINICAL DATA:  Evaluate clip placement. EXAM: DIAGNOSTIC LEFT MAMMOGRAM POST ULTRASOUND AND STEREOTACTIC BIOPSIES COMPARISON:  Previous exam(s). FINDINGS: Mammographic images were obtained following ultrasound and stereotactic guided biopsy of left breast calcifications and a medial left breast mass. The wing shaped marker is within the biopsy left breast mass. The top hat shaped marker is 9 mm lateral to the biopsied calcifications. IMPRESSION: Biopsy marker placement as described above. Final Assessment: Post Procedure Mammograms for Marker Placement Electronically Signed   By: Dorise Bullion III M.D   On: 12/15/2015 11:03   Mm Lt Breast Bx W Loc Dev 1st Lesion Image Bx Spec Stereo Guide  12/19/2015  ADDENDUM REPORT: 12/19/2015 17:41 ADDENDUM: PATHOLOGY ADDENDUM: Pathology: Left breast medial mass, 5 cm from the nipple: Invasive mammary carcinoma with apocrine features. 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 Pathology concordance with imaging findings: Both sites are concordant with the imaging appearance. The clip films demonstrate clips marking malignant sites at least 7.3 cm apart. A second area calcifications in the lateral portion of the left breast, middle depth has not been sampled. Biopsy can be performed as needed. Additional biopsy may not be necessary given the extent of disease already documented. Recommendation: Consultation regarding treatment plan -to be arranged by Dr. Dema Severin. At the request of the patient, I spoke with her by telephone on 12/19/2015 at 17:30. She reports doing well after the biopsy . Electronically Signed   By: Nolon Nations M.D.   On: 12/19/2015 17:41  12/19/2015  CLINICAL DATA:  Left breast calcifications EXAM: LEFT BREAST STEREOTACTIC CORE NEEDLE BIOPSY COMPARISON:  Previous exams. FINDINGS: The patient and I discussed the procedure of stereotactic-guided biopsy including benefits and alternatives. We discussed the high likelihood of a successful procedure. We discussed the risks of the procedure including  infection, bleeding, tissue injury, clip migration, and inadequate sampling. Informed written consent was given. The usual time out protocol was performed immediately prior to the procedure. Using sterile technique and 1% Lidocaine as local anesthetic, under stereotactic guidance, a 9 gauge vacuum assisted device was used to perform core needle biopsy of calcifications in the lateral left breast using a lateral approach. Specimen radiograph was performed showing calcifications in multiple cores. Specimens with calcifications are identified for pathology. At the conclusion of the procedure, a top hat shaped tissue marker clip was deployed into the biopsy cavity. Follow-up 2-view mammogram was performed and dictated separately. IMPRESSION: Stereotactic-guided biopsy of left  breast calcifications. No apparent complications. Electronically Signed: By: Dorise Bullion III M.D On: 12/15/2015 10:10   [place] Lt Breast Bx W Loc Dev 1st Lesion Img Bx Spec US Guide  12/19/2015  ADDENDUM REPORT: 12/19/2015 17:40 ADDENDUM: PATHOLOGY ADDENDUM: Pathology: Left breast medial mass, 5 cm from the nipple: Invasive mammary carcinoma with apocrine features. Left breast calcifications lateral breast: Ductal carcinoma in situ with Camino necrosis and calcifications intermediate nuclear grade. Pathology concordance with imaging findings: Both sites are concordant with the imaging appearance. The clip films demonstrate clips marking malignant sites at least 7.3 cm apart. A second area calcifications in the lateral portion of the left breast, middle depth has not been sampled. Biopsy can be performed as needed. Additional biopsy may not be necessary given the extent of disease already documented. Recommendation: Consultation regarding treatment plan -to be arranged by Dr. Dema Severin. At the request of the patient, I spoke with her by telephone on 12/19/2015 at 17:30. She reports doing well after the biopsy . Electronically Signed   By: Nolon Nations M.D.   On: 12/19/2015 17:40  12/19/2015  CLINICAL DATA:  Medial left breast mass EXAM: ULTRASOUND GUIDED LEFT BREAST CORE NEEDLE BIOPSY COMPARISON:  Previous exam(s). FINDINGS: I met with the patient and we discussed the procedure of ultrasound-guided biopsy, including benefits and alternatives. We discussed the high likelihood of a successful procedure. We discussed the risks of the procedure, including infection, bleeding, tissue injury, clip migration, and inadequate sampling. Informed written consent was given. The usual time-out protocol was performed immediately prior to the procedure. Using sterile technique and 1% Lidocaine as local anesthetic, under direct ultrasound visualization, a 14 gauge spring-loaded device was used to perform biopsy of a left breast  mass using a lateral approach. At the conclusion of the procedure a wing shaped tissue marker clip was deployed into the biopsy cavity. Follow up 2 view mammogram was performed and dictated separately. IMPRESSION: Ultrasound guided biopsy of a left breast mass. No apparent complications. 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  PLAN:    1. Breast cancer: Given the fact that her tumor is greater than 1 cm and is triple negative, patient will likely require adjuvant chemotherapy. An aromatase inhibitor would offer no benefit given the triple negative status of her disease. Patient has a appointment with surgery later today to discuss surgical options. Because she has overlapping lesions in the same breast, she is contemplating pursuing total mastectomy without radiation therapy rather than multiple lumpectomies with adjuvant XRT. Patient will return to clinic 1-2 weeks after her surgery to discuss the final pathology results and treatment planning. She will also likely require port placement at the time of her surgery.   Approximately 45 minutes was spent in discussion  of which greater than 50% was consultation.  Patient expressed understanding and was in agreement with this plan. She also understands that She can call clinic at any time with any questions, concerns, or complaints.   Breast cancer of upper-inner quadrant of left female breast [facility])   Staging form: Breast, AJCC 7th Edition     Clinical stage from 01/06/2016: Stage IA (T1c, N0, M0) - Signed by Lloyd Huger, MD on 01/06/2016   Lloyd Huger, MD   01/06/2016 8:26 AM

## 2016-01-13 ENCOUNTER — Observation Stay: Payer: Medicare Other

## 2016-01-13 ENCOUNTER — Observation Stay: Payer: Medicare Other | Admitting: Certified Registered Nurse Anesthetist

## 2016-01-13 ENCOUNTER — Ambulatory Visit
Admission: RE | Admit: 2016-01-13 | Discharge: 2016-01-13 | Disposition: A | Discharge status: Home / Self Care | Payer: Medicare Other | Source: Ambulatory Visit | Attending: Surgery | Admitting: Surgery

## 2016-01-13 ENCOUNTER — Observation Stay
Admission: RE | Admit: 2016-01-13 | Discharge: 2016-01-14 | Disposition: A | Discharge status: Home / Self Care | Payer: Medicare Other | Source: Ambulatory Visit | Attending: Surgery | Admitting: Surgery

## 2016-01-13 ENCOUNTER — Encounter: Payer: Self-pay | Admitting: *Deleted

## 2016-01-13 ENCOUNTER — Encounter
Admission: RE | Disposition: A | Discharge status: Home / Self Care | Payer: Self-pay | Source: Ambulatory Visit | Attending: Surgery

## 2016-01-13 DIAGNOSIS — Z9849 Cataract extraction status, unspecified eye: Secondary | ICD-10-CM | POA: Insufficient documentation

## 2016-01-13 DIAGNOSIS — E079 Disorder of thyroid, unspecified: Secondary | ICD-10-CM | POA: Diagnosis not present

## 2016-01-13 DIAGNOSIS — E119 Type 2 diabetes mellitus without complications: Secondary | ICD-10-CM | POA: Insufficient documentation

## 2016-01-13 DIAGNOSIS — E785 Hyperlipidemia, unspecified: Secondary | ICD-10-CM | POA: Insufficient documentation

## 2016-01-13 DIAGNOSIS — C50912 Malignant neoplasm of unspecified site of left female breast: Principal | ICD-10-CM | POA: Insufficient documentation

## 2016-01-13 DIAGNOSIS — I10 Essential (primary) hypertension: Secondary | ICD-10-CM | POA: Diagnosis not present

## 2016-01-13 DIAGNOSIS — M810 Age-related osteoporosis without current pathological fracture: Secondary | ICD-10-CM | POA: Insufficient documentation

## 2016-01-13 DIAGNOSIS — Z8371 Family history of colonic polyps: Secondary | ICD-10-CM | POA: Diagnosis not present

## 2016-01-13 DIAGNOSIS — Z8673 Personal history of transient ischemic attack (TIA), and cerebral infarction without residual deficits: Secondary | ICD-10-CM | POA: Diagnosis not present

## 2016-01-13 DIAGNOSIS — K219 Gastro-esophageal reflux disease without esophagitis: Secondary | ICD-10-CM | POA: Insufficient documentation

## 2016-01-13 DIAGNOSIS — C50919 Malignant neoplasm of unspecified site of unspecified female breast: Secondary | ICD-10-CM

## 2016-01-13 DIAGNOSIS — C50812 Malignant neoplasm of overlapping sites of left female breast: Secondary | ICD-10-CM

## 2016-01-13 DIAGNOSIS — Z79899 Other long term (current) drug therapy: Secondary | ICD-10-CM | POA: Diagnosis not present

## 2016-01-13 DIAGNOSIS — M1990 Unspecified osteoarthritis, unspecified site: Secondary | ICD-10-CM | POA: Diagnosis not present

## 2016-01-13 HISTORY — PX: PORTACATH PLACEMENT: SHX2246

## 2016-01-13 HISTORY — PX: PARTIAL MASTECTOMY WITH AXILLARY SENTINEL LYMPH NODE BIOPSY: SHX6004

## 2016-01-13 LAB — GLUCOSE, CAPILLARY
GLUCOSE-CAPILLARY: 129 mg/dL — AB (ref 65–99)
Glucose-Capillary: 109 mg/dL — ABNORMAL HIGH (ref 65–99)

## 2016-01-13 SURGERY — PARTIAL MASTECTOMY WITH AXILLARY SENTINEL LYMPH NODE BIOPSY
Anesthesia: General | Site: Chest | Laterality: Right | Wound class: Clean

## 2016-01-13 MED ORDER — HEPARIN SODIUM (PORCINE) 5000 UNIT/ML IJ SOLN
INTRAMUSCULAR | Status: AC
  Filled 2016-01-13: qty 1

## 2016-01-13 MED ORDER — FENTANYL CITRATE (PF) 100 MCG/2ML IJ SOLN
INTRAMUSCULAR | Status: AC
  Administered 2016-01-13: 25 ug via INTRAVENOUS
  Filled 2016-01-13: qty 2

## 2016-01-13 MED ORDER — SODIUM CHLORIDE 0.9 % IJ SOLN
INTRAMUSCULAR | Status: AC
  Filled 2016-01-13: qty 50

## 2016-01-13 MED ORDER — FENTANYL CITRATE (PF) 100 MCG/2ML IJ SOLN
25.0000 ug | INTRAMUSCULAR | Status: AC | PRN
  Administered 2016-01-13 (×6): 25 ug via INTRAVENOUS

## 2016-01-13 MED ORDER — ONDANSETRON 8 MG PO TBDP
4.0000 mg | ORAL_TABLET | Freq: Four times a day (QID) | ORAL | Status: DC | PRN
Start: 2016-01-13 — End: 2016-01-14

## 2016-01-13 MED ORDER — MIDAZOLAM HCL 2 MG/2ML IJ SOLN
INTRAMUSCULAR | Status: DC | PRN
  Administered 2016-01-13 (×2): 1 mg via INTRAVENOUS

## 2016-01-13 MED ORDER — LIDOCAINE HCL (CARDIAC) 20 MG/ML IV SOLN
INTRAVENOUS | Status: DC | PRN
  Administered 2016-01-13: 50 mg via INTRAVENOUS

## 2016-01-13 MED ORDER — DEXTROSE-NACL 5-0.2 % IV SOLN
INTRAVENOUS | Status: DC
  Administered 2016-01-13: 18:00:00 via INTRAVENOUS

## 2016-01-13 MED ORDER — SODIUM CHLORIDE 0.9 % IV SOLN
INTRAVENOUS | Status: DC
  Administered 2016-01-13: 10:00:00 via INTRAVENOUS

## 2016-01-13 MED ORDER — LEVOTHYROXINE SODIUM 50 MCG PO TABS
50.0000 ug | ORAL_TABLET | Freq: Every day | ORAL | Status: DC
  Administered 2016-01-14: 50 ug via ORAL
  Filled 2016-01-13: qty 1

## 2016-01-13 MED ORDER — CEFAZOLIN SODIUM 1-5 GM-% IV SOLN
1.0000 g | Freq: Once | INTRAVENOUS | Status: AC
  Administered 2016-01-13: 1 g via INTRAVENOUS

## 2016-01-13 MED ORDER — METHYLENE BLUE 0.5 % INJ SOLN
INTRAVENOUS | Status: AC
  Filled 2016-01-13: qty 10

## 2016-01-13 MED ORDER — EPHEDRINE SULFATE 50 MG/ML IJ SOLN
INTRAMUSCULAR | Status: DC | PRN
  Administered 2016-01-13: 10 mg via INTRAVENOUS

## 2016-01-13 MED ORDER — ACETAMINOPHEN 10 MG/ML IV SOLN
INTRAVENOUS | Status: DC | PRN
  Administered 2016-01-13: 1000 mg via INTRAVENOUS

## 2016-01-13 MED ORDER — ACETAMINOPHEN 10 MG/ML IV SOLN
INTRAVENOUS | Status: AC
  Filled 2016-01-13: qty 100

## 2016-01-13 MED ORDER — GLYCOPYRROLATE 0.2 MG/ML IJ SOLN
INTRAMUSCULAR | Status: DC | PRN
  Administered 2016-01-13: 0.2 mg via INTRAVENOUS

## 2016-01-13 MED ORDER — ACETAMINOPHEN 650 MG RE SUPP: 650.0000 mg | Freq: Four times a day (QID) | RECTAL | Status: DC | PRN

## 2016-01-13 MED ORDER — FAMOTIDINE 20 MG PO TABS
20.0000 mg | ORAL_TABLET | Freq: Once | ORAL | Status: AC
  Administered 2016-01-13: 20 mg via ORAL

## 2016-01-13 MED ORDER — TECHNETIUM TC 99M SULFUR COLLOID
1.0910 | Freq: Once | INTRAVENOUS | Status: AC | PRN
  Administered 2016-01-13: 1.091 via INTRAVENOUS

## 2016-01-13 MED ORDER — MORPHINE SULFATE (PF) 2 MG/ML IV SOLN: 1.0000 mg | INTRAVENOUS | Status: DC | PRN

## 2016-01-13 MED ORDER — LACTATED RINGERS IV SOLN
INTRAVENOUS | Status: DC | PRN
  Administered 2016-01-13: 15:00:00 via INTRAVENOUS

## 2016-01-13 MED ORDER — METHYLENE BLUE 0.5 % INJ SOLN
INTRAVENOUS | Status: DC | PRN
  Administered 2016-01-13: 2 mL via SUBMUCOSAL

## 2016-01-13 MED ORDER — ATORVASTATIN CALCIUM 20 MG PO TABS
80.0000 mg | ORAL_TABLET | Freq: Every day | ORAL | Status: DC
  Administered 2016-01-13: 80 mg via ORAL
  Filled 2016-01-13: qty 4

## 2016-01-13 MED ORDER — ACETAMINOPHEN 325 MG PO TABS: 650.0000 mg | ORAL_TABLET | Freq: Four times a day (QID) | ORAL | Status: DC | PRN

## 2016-01-13 MED ORDER — LIDOCAINE HCL (PF) 1 % IJ SOLN
INTRAMUSCULAR | Status: AC
  Filled 2016-01-13: qty 30

## 2016-01-13 MED ORDER — ONDANSETRON HCL 4 MG/2ML IJ SOLN
INTRAMUSCULAR | Status: DC | PRN
  Administered 2016-01-13: 4 mg via INTRAVENOUS

## 2016-01-13 MED ORDER — DEXAMETHASONE SODIUM PHOSPHATE 10 MG/ML IJ SOLN
INTRAMUSCULAR | Status: DC | PRN
  Administered 2016-01-13: 10 mg via INTRAVENOUS

## 2016-01-13 MED ORDER — ONDANSETRON HCL 4 MG/2ML IJ SOLN: 4.0000 mg | Freq: Once | INTRAMUSCULAR | Status: DC | PRN

## 2016-01-13 MED ORDER — PROPOFOL 10 MG/ML IV BOLUS
INTRAVENOUS | Status: DC | PRN
  Administered 2016-01-13: 20 mg via INTRAVENOUS
  Administered 2016-01-13: 120 mg via INTRAVENOUS

## 2016-01-13 MED ORDER — FAMOTIDINE 20 MG PO TABS
ORAL_TABLET | ORAL | Status: AC
  Administered 2016-01-13: 10:00:00
  Filled 2016-01-13: qty 1

## 2016-01-13 MED ORDER — PHENYLEPHRINE HCL 10 MG/ML IJ SOLN
INTRAMUSCULAR | Status: DC | PRN
  Administered 2016-01-13 (×9): 100 ug via INTRAVENOUS

## 2016-01-13 MED ORDER — CEFAZOLIN SODIUM-DEXTROSE 2-4 GM/100ML-% IV SOLN
INTRAVENOUS | Status: AC
  Administered 2016-01-13: 1 g via INTRAVENOUS
  Filled 2016-01-13: qty 100

## 2016-01-13 MED ORDER — HYDROCODONE-ACETAMINOPHEN 5-325 MG PO TABS
1.0000 | ORAL_TABLET | ORAL | Status: DC | PRN
  Administered 2016-01-13: 2 via ORAL
  Filled 2016-01-13: qty 2

## 2016-01-13 MED ORDER — HYDROCHLOROTHIAZIDE 25 MG PO TABS
12.5000 mg | ORAL_TABLET | Freq: Every day | ORAL | Status: DC
  Administered 2016-01-13: 12.5 mg via ORAL
  Filled 2016-01-13: qty 1

## 2016-01-13 MED ORDER — CEFAZOLIN SODIUM 1-5 GM-% IV SOLN
INTRAVENOUS | Status: AC
  Administered 2016-01-13: 1 g via INTRAVENOUS
  Filled 2016-01-13: qty 50

## 2016-01-13 MED ORDER — FENTANYL CITRATE (PF) 100 MCG/2ML IJ SOLN
INTRAMUSCULAR | Status: DC | PRN
  Administered 2016-01-13: 25 ug via INTRAVENOUS
  Administered 2016-01-13 (×3): 50 ug via INTRAVENOUS
  Administered 2016-01-13: 25 ug via INTRAVENOUS
  Administered 2016-01-13: 50 ug via INTRAVENOUS

## 2016-01-13 MED ORDER — ONDANSETRON HCL 4 MG/2ML IJ SOLN: 4.0000 mg | Freq: Four times a day (QID) | INTRAMUSCULAR | Status: DC | PRN

## 2016-01-13 SURGICAL SUPPLY — 45 items
BULB RESERV EVAC DRAIN JP 100C (MISCELLANEOUS) ×8 IMPLANT
CANISTER SUCT 1200ML W/VALVE (MISCELLANEOUS) ×4 IMPLANT
CHLORAPREP W/TINT 26ML (MISCELLANEOUS) ×4 IMPLANT
COVER LIGHT HANDLE STERIS (MISCELLANEOUS) ×8 IMPLANT
DRAIN CHANNEL JP 15F RND 16 (MISCELLANEOUS) ×8 IMPLANT
DRAPE C-ARM XRAY 36X54 (DRAPES) ×4 IMPLANT
DRAPE LAPAROTOMY TRNSV 106X77 (MISCELLANEOUS) ×4 IMPLANT
ELECT REM PT RETURN 9FT ADLT (ELECTROSURGICAL) ×4
ELECTRODE REM PT RTRN 9FT ADLT (ELECTROSURGICAL) ×2 IMPLANT
GAUZE SPONGE 4X4 12PLY STRL (GAUZE/BANDAGES/DRESSINGS) ×4 IMPLANT
GLOVE BIO SURGEON STRL SZ7.5 (GLOVE) ×4 IMPLANT
GOWN STRL REUS W/ TWL LRG LVL3 (GOWN DISPOSABLE) ×6 IMPLANT
GOWN STRL REUS W/TWL LRG LVL3 (GOWN DISPOSABLE) ×6
KIT PORT POWER 8FR ISP CVUE (Catheter) ×4 IMPLANT
KIT RM TURNOVER STRD PROC AR (KITS) ×4 IMPLANT
LABEL OR SOLS (LABEL) ×4 IMPLANT
LIQUID BAND (GAUZE/BANDAGES/DRESSINGS) ×4 IMPLANT
NEEDLE FILTER BLUNT 18X 1/2SAF (NEEDLE) ×2
NEEDLE FILTER BLUNT 18X1 1/2 (NEEDLE) ×2 IMPLANT
PACK BASIN MINOR ARMC (MISCELLANEOUS) ×4 IMPLANT
PACK PORT-A-CATH (MISCELLANEOUS) ×4 IMPLANT
SPONGE LAP 18X18 5 PK (GAUZE/BANDAGES/DRESSINGS) ×4 IMPLANT
SUT CHROMIC 3 0 SH 27 (SUTURE) ×4 IMPLANT
SUT CHROMIC 4 0 RB 1X27 (SUTURE) IMPLANT
SUT ETHILON 3-0 FS-10 30 BLK (SUTURE) ×4
SUT ETHILON 4-0 (SUTURE)
SUT ETHILON 4-0 FS2 18XMFL BLK (SUTURE)
SUT MNCRL 4-0 (SUTURE) ×4
SUT MNCRL 4-0 27XMFL (SUTURE) ×4
SUT MNCRL+ 5-0 UNDYED PC-3 (SUTURE) ×2 IMPLANT
SUT MON AB 5-0 P3 18 (SUTURE) ×4 IMPLANT
SUT MONOCRYL 5-0 (SUTURE) ×2
SUT SILK 3-0 (SUTURE) ×4
SUT SILK 3-0 SH-1 18XCR BRD (SUTURE) ×4
SUT SILK 4 0 SH (SUTURE) ×4 IMPLANT
SUT VIC AB 5-0 RB1 27 (SUTURE) ×4 IMPLANT
SUT VICRYL+ 3-0 144IN (SUTURE) ×4 IMPLANT
SUTURE EHLN 3-0 FS-10 30 BLK (SUTURE) ×2 IMPLANT
SUTURE ETHLN 4-0 FS2 18XMF BLK (SUTURE) IMPLANT
SUTURE MNCRL 4-0 27XMF (SUTURE) ×4 IMPLANT
SUTURE SILK 3-0 SH-1 18XCR BRD (SUTURE) ×4 IMPLANT
SYR 3ML LL SCALE MARK (SYRINGE) ×4 IMPLANT
SYRINGE 10CC LL (SYRINGE) ×4 IMPLANT
TOWEL OR 17X26 4PK STRL BLUE (TOWEL DISPOSABLE) ×4 IMPLANT
WATER STERILE IRR 1000ML POUR (IV SOLUTION) ×4 IMPLANT

## 2016-01-13 NOTE — H&P (Signed)
  She reports no change in condition since the day of the office exam.  She has had preoperative injection of radioactive technetium sulfur colloid.  Recent lab work noted.  I discussed with her the plan for insertion of a Port-A-Cath on the right side. Plan is also for mastectomy on the left side. The left side was marked YES.

## 2016-01-13 NOTE — Anesthesia Preprocedure Evaluation (Addendum)
Anesthesia Evaluation  Patient identified by MRN, date of birth, ID band Patient awake    Reviewed: Allergy & Precautions, NPO status , Patient's Chart, lab work & pertinent test results, reviewed documented beta blocker date and time   Airway Mallampati: II  TM Distance: >3 FB     Dental  (+) Chipped   Pulmonary           Cardiovascular hypertension, Pt. on medications      Neuro/Psych  Headaches, TIACVA    GI/Hepatic GERD  Controlled,  Endo/Other  diabetes, Type 2Hypothyroidism   Renal/GU      Musculoskeletal  (+) Arthritis ,   Abdominal   Peds  Hematology   Anesthesia Other Findings Did not have a true stroke, but did have a TIA.  Reproductive/Obstetrics                            Anesthesia Physical Anesthesia Plan  ASA: III  Anesthesia Plan: General   Post-op Pain Management:    Induction: Intravenous  Airway Management Planned: LMA  Additional Equipment:   Intra-op Plan:   Post-operative Plan:   Informed Consent: I have reviewed the patients History and Physical, chart, labs and discussed the procedure including the risks, benefits and alternatives for the proposed anesthesia with the patient or authorized representative who has indicated his/her understanding and acceptance.     Plan Discussed with: CRNA  Anesthesia Plan Comments:         Anesthesia Quick Evaluation

## 2016-01-13 NOTE — Anesthesia Procedure Notes (Signed)
Procedure Name: LMA Insertion Date/Time: 01/13/2016 11:28 AM Performed by: Johnna Acosta Pre-anesthesia Checklist: Patient identified, Emergency Drugs available, Suction available, Patient being monitored and Timeout performed Patient Re-evaluated:Patient Re-evaluated prior to inductionOxygen Delivery Method: Circle system utilized Preoxygenation: Pre-oxygenation with 100% oxygen Intubation Type: IV induction LMA: LMA inserted LMA Size: 3.5 Tube type: Oral Number of attempts: 1 Placement Confirmation: positive ETCO2 and breath sounds checked- equal and bilateral Tube secured with: Tape Dental Injury: Teeth and Oropharynx as per pre-operative assessment

## 2016-01-13 NOTE — Progress Notes (Signed)
She reports good progress after surgery. She has had minimal discomfort. She is taking clear liquids well.  On examination her wound appears to be healing satisfactorily. The drains are functioning.  I discussed the findings of surgery and plan for additional care. Do plan to keep her overnight for appeared of observation  Advanced to regular diet. I recommended ambulation in the hallway prior to discharge.

## 2016-01-13 NOTE — OR Nursing (Signed)
Patient has area on left forearm.  No drainage noted area, area around wound is red.  Dr Nicholes Stairs assessed no new orders at this time.

## 2016-01-13 NOTE — Transfer of Care (Signed)
Immediate Anesthesia Transfer of Care Note  Patient: Erica Levy  Procedure(s) Performed: Procedure(s): PARTIAL MASTECTOMY (Left) INSERTION PORT-A-CATH (Right)  Patient Location: PACU  Anesthesia Type:General  Level of Consciousness: sedated  Airway & Oxygen Therapy: Patient Spontanous Breathing and Patient connected to face mask oxygen  Post-op Assessment: Report given to RN and Post -op Vital signs reviewed and stable  Post vital signs: Reviewed and stable  Last Vitals:  Filed Vitals:   01/13/16 0958  BP: 149/61  Pulse: 93  Temp: 37.1 C  Resp: 16    Last Pain: There were no vitals filed for this visit.       Complications: No apparent anesthesia complications

## 2016-01-13 NOTE — OR Nursing (Signed)
Sacral pad sent to Or

## 2016-01-13 NOTE — Anesthesia Postprocedure Evaluation (Signed)
Anesthesia Post Note  Patient: Erica Levy  Procedure(s) Performed: Procedure(s) (LRB): PARTIAL MASTECTOMY (Left) INSERTION PORT-A-CATH (Right)  Patient location during evaluation: PACU Anesthesia Type: General Level of consciousness: awake and alert Pain management: pain level controlled Vital Signs Assessment: post-procedure vital signs reviewed and stable Respiratory status: spontaneous breathing and respiratory function stable Cardiovascular status: stable Anesthetic complications: no    Last Vitals:  Filed Vitals:   01/13/16 1544 01/13/16 1550  BP:  135/73  Pulse: 80 80  Temp:    Resp: 15 10    Last Pain:  Filed Vitals:   01/13/16 1551  PainSc: 3                  KEPHART,WILLIAM K

## 2016-01-13 NOTE — Progress Notes (Signed)
  Oncology Nurse Navigator Documentation  Navigator Location: [facility] Onc (01/13/16 0900) Navigator Encounter Type: Other (01/13/16 0900)       Surgery Date: 01/13/16 (01/13/16 0900) Treatment Initiated Date: 01/13/16 (01/13/16 0900) Patient Visit Type: Surgery (01/13/16 0900)   Barriers/Navigation Needs: No barriers at this time (01/13/16 0900)   Interventions: None required (01/13/16 0900)            Acuity: Level 2 (01/13/16 0900)         Time Spent with Patient: 30 (01/13/16 0900)   Met patient and her family prior to her surgery today.  No needs at this time.  Offered support.  She is to call if she has any questions or needs.

## 2016-01-13 NOTE — Op Note (Signed)
OPERATIVE REPORT  PREOPERATIVE  DIAGNOSIS: . Left breast cancer  POSTOPERATIVE DIAGNOSIS: Left breast cancer  PROCEDURE: . Left modified radical mastectomy, and insertion of central venous catheter with subcutaneous infusion port.  ANESTHESIA:  General  SURGEON: Rochel Brome  MD   INDICATIONS: . She had recent mammogram findings of nodules in her left breast. She has had biopsy of 2 nodules 1 was infiltrating ductal carcinoma and the other was ductal carcinoma in situ. Surgery was recommended for definitive treatment.   The patient did have preoperative injection of radioactive technetium sulfur colloid. The patient was placed on the operating table under general anesthesia. The left arm was placed on a lateral arm support. A rolled towel was placed behind her shoulder blades. The neck was extended and the head turned 5 to the left. The left axilla was probed with a gamma counter and demonstrated no radioactivity. I did therefore inject 2 cc of methylene blue dye into subcutaneous periareolar tissues and massaged the breast for 1 minute. The site was again prepared with ChloraPrep and draped in a sterile manner.   An incision was made in the right subclavian area and some 3 cm in length and carried down through subcutaneous tissues tissues. Several small bleeding points were cauterized. A subcutaneous pouch was created large enough to admit the PowerPort. With the patient in the Trendelenburg position the neck was examined with ultrasound demonstrated the location of the jugular vein and also  identified the carotid artery. A small incision was made over the jugular vein. With ultrasound guidance a needle was inserted into the jugular vein and advanced a guidewire down into the central circulation. An ultrasonic image was saved for the paper chart. The needle was withdrawn. The position of the guidewire was demonstrated in the vena cava as seen with fluoroscopy. The dilator and introducer sheath  were advanced over the guidewire. The guidewire and dilator were removed. The catheter was advanced down through the sheath into the central circulation. This sheath was peeled away. The catheter was pulled back to approximately 11 cm from the incision and fluoroscopic image demonstrated the tip of the catheter in the vena cava. An image was saved. The catheter was tunneled to the subclavian incision and pressure held over the tunnel site. The catheter was cut to fit and attached to the PowerPort using the coming sleeve to secure it. The power port was accessed with a Huber needle and aspirated a trace of blood and flushed with 10 cc of saline solution. The port was placed into the subcutaneous pouch and sutured to the deep fascia with 4-0 silk. Hemostasis was intact. The wound was closed with interrupted 5-0 Monocryl subcutaneous sutures followed by skin closure with running 5-0 Monocryl subcuticular suture. The cervical wound was closed with a single 5-0 Monocryl subcuticular suture. Both wounds were treated with LiquiBand and allowed to dry  Attention was turned to the left breast. This was again probed with a gamma counter and found no significant radioactivity in the axilla. An incision was made from medial to lateral above and below the breast and carried down through subcutaneous tissues tissues  using electrocautery for hemostasis. The skin edges were retracted with silk sutures as skin and subcutaneous flaps were raised in the direction of the clavicle sternum inframammary fold and latissimus dorsi muscle. Further dissection was carried out up into the axilla and again found no radioactivity. There was no sentinel lymph node with blue dye seen..  The breast was elevated off the  underlying fascia using electrocautery. It is noted that during the course of the surgery a number of bleeding points were suture ligated with 3-0 chromic sutures and with 3-0 chromic ligatures. The dissection was carried out up  into the axilla and demonstrated no palpable mass. The dissection was carried up to the axillary vein. The axillary contents were dissected leaving the intercostal brachial nerve intact. Location of the long thoracic nerve was demonstrated. The axillary contents were removed in continuity with the breast. The lateral end of the skin ellipse was tagged with a 3-0 nylon stitch for the pathologist orientation. The specimen was submitted for routine pathology. The wound was inspected and multiple small bleeding points were cauterized. The wound was irrigated with warm saline solution. After examining thoroughly seeing hemostasis was intact 2 separate stab wounds were made inferiorly to insert 2 19 [place] Blake drains. The drains were sutured to the skin with 3-0 nylon.. One drain was advanced up into the axilla and the other along the anterior chest wall. The skin incision was closed with running 4-0 Monocryl subcuticular suture and LiquiBand. LiquiBand was also applied to the drain sites. The drain site was further dressed with 4 x 4 folded cotton gauze benzoin and 3 inch silk tape. The drains were activated. The patient appeared to tolerate the procedure satisfactorily and was then prepared for transfer to the recovery.  Rochel Brome M.D.

## 2016-01-14 DIAGNOSIS — C50912 Malignant neoplasm of unspecified site of left female breast: Secondary | ICD-10-CM | POA: Diagnosis not present

## 2016-01-14 NOTE — Discharge Instructions (Signed)
Take Tylenol as needed for pain.  Keep dressing dry.  Empty drains 2 or more times per day and record drainage.  Call office next week to report the amount of drainage and schedule a postoperative appointment.  Stay off of aspirin until after follow-up office visit.

## 2016-01-14 NOTE — Care Management Obs Status (Signed)
[place] NOTIFICATION   Patient Details  Name: Erica Levy MRN: [id number] Date of Birth: 1941-06-27   Medicare Observation Status Notification Given:  No (surgical patient discharged prior to letter delivered)    Ival Bible, RN 01/14/2016, 5:26 PM

## 2016-01-14 NOTE — Discharge Summary (Signed)
She was admitted to an observation bed after left modified radical mastectomy and insertion of Port-A-Cath. She does have a recent finding of multifocal carcinoma of the left breast.  She has done well overnight. She is up walking and taking a diet. Her drains are functioning satisfactorily. Her wounds appear to be healing satisfactorily.  Final diagnosis is carcinoma of the left breast  Instructions are recorded and will follow up in the office

## 2016-01-16 ENCOUNTER — Encounter: Payer: Self-pay | Admitting: Surgery

## 2016-01-17 LAB — SURGICAL PATHOLOGY

## 2016-01-18 ENCOUNTER — Encounter: Payer: Self-pay | Admitting: *Deleted

## 2016-01-18 NOTE — Progress Notes (Signed)
Called to follow up with patient post surgery.  States she is doing well.  No needs at this time.  Informed of next appointment with Dr. Grayland Ormond on 01/25/16 @ 2:30.  She is to call if she has any questions or needs.

## 2016-01-25 ENCOUNTER — Encounter: Payer: Self-pay | Admitting: *Deleted

## 2016-01-25 ENCOUNTER — Inpatient Hospital Stay: Payer: Medicare Other | Attending: Oncology | Admitting: Oncology

## 2016-01-25 VITALS — BP 155/77 | HR 88 | Temp 98.3°F | Resp 18 | Wt 155.3 lb

## 2016-01-25 DIAGNOSIS — I10 Essential (primary) hypertension: Secondary | ICD-10-CM | POA: Diagnosis not present

## 2016-01-25 DIAGNOSIS — E119 Type 2 diabetes mellitus without complications: Secondary | ICD-10-CM | POA: Insufficient documentation

## 2016-01-25 DIAGNOSIS — Z170 Estrogen receptor positive status [ER+]: Secondary | ICD-10-CM | POA: Diagnosis not present

## 2016-01-25 DIAGNOSIS — E7800 Pure hypercholesterolemia, unspecified: Secondary | ICD-10-CM | POA: Insufficient documentation

## 2016-01-25 DIAGNOSIS — M818 Other osteoporosis without current pathological fracture: Secondary | ICD-10-CM | POA: Diagnosis not present

## 2016-01-25 DIAGNOSIS — E039 Hypothyroidism, unspecified: Secondary | ICD-10-CM

## 2016-01-25 DIAGNOSIS — Z79899 Other long term (current) drug therapy: Secondary | ICD-10-CM | POA: Diagnosis not present

## 2016-01-25 DIAGNOSIS — C50812 Malignant neoplasm of overlapping sites of left female breast: Secondary | ICD-10-CM | POA: Diagnosis not present

## 2016-01-25 DIAGNOSIS — C50212 Malignant neoplasm of upper-inner quadrant of left female breast: Secondary | ICD-10-CM

## 2016-01-25 DIAGNOSIS — Z8673 Personal history of transient ischemic attack (TIA), and cerebral infarction without residual deficits: Secondary | ICD-10-CM | POA: Insufficient documentation

## 2016-01-25 DIAGNOSIS — Z803 Family history of malignant neoplasm of breast: Secondary | ICD-10-CM | POA: Diagnosis not present

## 2016-01-25 DIAGNOSIS — Z8669 Personal history of other diseases of the nervous system and sense organs: Secondary | ICD-10-CM | POA: Diagnosis not present

## 2016-01-25 DIAGNOSIS — M1990 Unspecified osteoarthritis, unspecified site: Secondary | ICD-10-CM | POA: Diagnosis not present

## 2016-01-25 DIAGNOSIS — K219 Gastro-esophageal reflux disease without esophagitis: Secondary | ICD-10-CM | POA: Insufficient documentation

## 2016-01-25 NOTE — Progress Notes (Signed)
  Oncology Nurse Navigator Documentation  Navigator Location: [facility] (01/25/16 1600) Navigator Encounter Type: Clinic/MDC (01/25/16 1600)           Patient Visit Type: MedOnc (01/25/16 1600) Treatment Phase: Pre-Tx/Tx Discussion (01/25/16 1600) Barriers/Navigation Needs: Education (01/25/16 1600)                Acuity: Level 2 (01/25/16 1600)         Time Spent with Patient: 45 (01/25/16 1600)   Met with patient and her daughter today during her appointment with Dr. Grayland Ormond for treatment planning.  Dr. Grayland Ormond reviewed treatment plan of Adriamycin / Cytoxan for 4 cycles followed by 12 weeks of Taxol.  Patient is to start on 7/28 at the [facility] office.  She is to call if she has any questions or needs.

## 2016-01-25 NOTE — Progress Notes (Signed)
States is feeling well post surgery. Offers no complaints. Surgery went well.

## 2016-01-29 MED ORDER — ONDANSETRON HCL 8 MG PO TABS: 8.0000 mg | ORAL_TABLET | Freq: Two times a day (BID) | ORAL | Status: DC | PRN

## 2016-01-29 MED ORDER — PROCHLORPERAZINE MALEATE 10 MG PO TABS: 10.0000 mg | ORAL_TABLET | Freq: Four times a day (QID) | ORAL | Status: DC | PRN

## 2016-01-29 MED ORDER — LIDOCAINE-PRILOCAINE 2.5-2.5 % EX CREA: TOPICAL_CREAM | CUTANEOUS | Status: DC

## 2016-01-29 NOTE — Progress Notes (Signed)
.  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 Chief Complaint  Patient presents with  . Breast Cancer    INTERVAL HISTORY: Patient returns to clinic today for further evaluation, discussion of her pathology results, and treatment planning. She tolerated her surgery well without significant side effects. She has no neurologic complaints. She denies any recent fevers or illnesses. She has a good appetite and denies weight loss. She denies any pain. She denies any chest pain or shortness of breath. She denies any nausea, vomiting, constipation, or diarrhea. She has no urinary complaints. Patient offers no specific complaints today.  REVIEW OF SYSTEMS:   Review of Systems  Constitutional: Negative.  Negative for fever, weight loss and malaise/fatigue.  Respiratory: Negative.  Negative for cough and shortness of breath.   Cardiovascular: Negative.  Negative for chest pain.  Gastrointestinal: Negative.  Negative for abdominal pain.  Genitourinary: Negative.   Musculoskeletal: Negative.   Neurological: Negative.  Negative for weakness.  Psychiatric/Behavioral: The patient is nervous/anxious.     As per HPI. Otherwise, a complete review of systems is negatve.  PAST MEDICAL HISTORY: Past Medical History  Diagnosis Date  . Hypertension   . Hypothyroid   . Hypercholesteremia   . DM (diabetes mellitus) ([place])     diet controlled  . Generalized OA   . Osteoporosis   . Migraine   . GERD (gastroesophageal reflux disease)   . TIA (transient ischemic attack)     July  . Stroke [facility])     PAST SURGICAL HISTORY: Past Surgical History  Procedure Laterality Date  . Tonsillectomy    . Cataract extraction    . Retinal detachment surgery Right       Dr. Starling Manns, [facility]  . Breast biopsy Left     bx/clip-neg  . Breast biopsy Left 12/15/2015    path pending/us and stereo bx  . Eye surgery Right     Cataract Extraction with IOL  . Dilation and curettage of uterus    . Partial mastectomy with axillary sentinel lymph node biopsy Left 01/13/2016    Procedure: PARTIAL MASTECTOMY;  Surgeon: Leonie Green, MD;  Location: [facility] ORS;  Service: General;  Laterality: Left;  . Portacath placement Right 01/13/2016    Procedure: INSERTION PORT-A-CATH;  Surgeon: Leonie Green, MD;  Location: [facility] ORS;  Service: General;  Laterality: Right;    FAMILY HISTORY Family History  Problem Relation Age of Onset  . Hypertension Mother   . Osteoarthritis Mother   . Dementia Father   . Breast cancer Cousin 60    mat cousin       ADVANCED DIRECTIVES:    HEALTH MAINTENANCE: Social History  Substance Use Topics  . Smoking status: Never Smoker   . Smokeless tobacco: Never Used  . Alcohol Use: No     Colonoscopy:  PAP:  Bone density:  Lipid panel:  No Known Allergies  Current Outpatient Prescriptions  Medication Sig Dispense Refill  . atorvastatin (LIPITOR) 80 MG tablet Take 1 tablet (80 mg total) by mouth daily at 6 PM. 30 tablet 0  . hydrochlorothiazide (HYDRODIURIL) 12.5 MG tablet Take 12.5 mg by mouth daily.     Marland Kitchen levothyroxine (SYNTHROID, LEVOTHROID) 50 MCG tablet Take 1 tablet by mouth daily.  0  . Vitamin D,  Ergocalciferol, (DRISDOL) 50000 UNITS CAPS capsule Take 2,000 Units by mouth daily.      No current facility-administered medications for this visit.    OBJECTIVE: Filed Vitals:   01/25/16 1512  BP: 155/77  Pulse: 88  Temp: 98.3 F (36.8 C)  Resp: 18     Body mass index is 28.4 kg/(m^2).    ECOG FS:0 - Asymptomatic  General: Well-developed, well-nourished, no acute distress. Eyes: Pink conjunctiva, anicteric sclera. Breasts: Patient requested exam be deferred today. Lungs: Clear to auscultation  bilaterally. Heart: Regular rate and rhythm. No rubs, murmurs, or gallops. Abdomen: Soft, nontender, nondistended. No organomegaly noted, normoactive bowel sounds. Musculoskeletal: No edema, cyanosis, or clubbing. Neuro: Alert, answering all questions appropriately. Cranial nerves grossly intact. Skin: No rashes or petechiae noted. Psych: Normal affect.   LAB RESULTS:  Lab Results  Component Value Date   NA 137 01/05/2016   K 3.7 01/05/2016   CL 102 01/05/2016   CO2 27 01/05/2016   GLUCOSE 117* 01/05/2016   BUN 21* 01/05/2016   CREATININE 0.97 01/05/2016   CALCIUM 9.7 01/05/2016   PROT 7.7 01/05/2016   ALBUMIN 4.6 01/05/2016   AST 38 01/05/2016   ALT 39 01/05/2016   ALKPHOS 119 01/05/2016   BILITOT 1.2 01/05/2016   GFRNONAA 57* 01/05/2016   GFRAA >60 01/05/2016    Lab Results  Component Value Date   WBC 5.9 01/05/2016   HGB 14.6 01/05/2016   HCT 41.7 01/05/2016   MCV 87.9 01/05/2016   PLT 225 01/05/2016     STUDIES: Nm Sentinel Node Inj-no Rpt (breast)  01/13/2016  CLINICAL DATA: Malignant neoplasm of overlapping sites of breast, left Sulfur colloid was injected intradermally by the nuclear medicine technologist for breast cancer sentinel node localization.   Dg Chest Port 1 View  01/13/2016  CLINICAL DATA:  Central line placement EXAM: PORTABLE CHEST 1 VIEW COMPARISON:  07/11/2006 FINDINGS: Porta catheter has been placed on the right with tip at the upper cavoatrial junction. No pneumothorax. Changes of left mastectomy with chest wall gas and drains. Normal heart size for low volume technique. No edema, consolidation, or effusion. IMPRESSION: New right porta catheter without adverse finding. Electronically Signed   By: Monte Fantasia M.D.   On: 01/13/2016 16:17   Dg C-arm 1-60 Min-no Report  01/13/2016  CLINICAL DATA: surgery C-ARM 1-60 MINUTES Fluoroscopy was utilized by the requesting physician.  No radiographic interpretation.    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  PLAN:    1. Triple negative left breast cancer: Given the triple negative status of the patient's tumor as well as her stage of disease, she will benefit from adjuvant chemotherapy. She will require MUGA test prior to initiation of treatment. Plan on giving patient 4 cycles of dose dense Adriamycin and Cytoxan with Neulasta support followed by 12 weekly cycles of Taxol. Return to clinic on February 24, 2016 to initiate cycle 1 of 4 of Adriamycin and Cytoxan. Because patient had modified mastectomy, she will not require adjuvant XRT. Although will further discuss with radiation oncology at the conclusion of her chemotherapy given her 2 positive axillary lymph nodes.  An aromatase inhibitor would offer no benefit given the triple negative status of her disease.   Approximately 30 minutes was spent in discussion of which greater than 50% was consultation.  Patient expressed understanding and was in agreement with this plan. She also understands that She can call clinic at any time with any  questions, concerns, or complaints.   Breast cancer of upper-inner quadrant of left female breast [facility])   Staging form: Breast, AJCC 7th Edition     Pathologic stage from 01/06/2016: Stage IIA (T1c, N1a, M0) - Signed by Lloyd Huger, MD on 01/06/2016   Lloyd Huger, MD   01/29/2016 11:43 PM

## 2016-02-14 ENCOUNTER — Ambulatory Visit
Admission: RE | Admit: 2016-02-14 | Discharge: 2016-02-14 | Disposition: A | Discharge status: Home / Self Care | Payer: Medicare Other | Source: Ambulatory Visit | Attending: Oncology | Admitting: Oncology

## 2016-02-14 DIAGNOSIS — C50212 Malignant neoplasm of upper-inner quadrant of left female breast: Secondary | ICD-10-CM | POA: Diagnosis present

## 2016-02-14 MED ORDER — TECHNETIUM TC 99M-LABELED RED BLOOD CELLS IV KIT
20.0000 | PACK | Freq: Once | INTRAVENOUS | Status: AC | PRN
  Administered 2016-02-14: 22.56 via INTRAVENOUS

## 2016-02-14 NOTE — Patient Instructions (Signed)
Doxorubicin injection What is this medicine? DOXORUBICIN (dox oh ROO bi sin) is a chemotherapy drug. It is used to treat many kinds of cancer like Hodgkin's disease, leukemia, non-Hodgkin's lymphoma, neuroblastoma, sarcoma, and Wilms' tumor. It is also used to treat bladder cancer, breast cancer, lung cancer, ovarian cancer, stomach cancer, and thyroid cancer. This medicine may be used for other purposes; ask your health care provider or pharmacist if you have questions. What should I tell my health care provider before I take this medicine? They need to know if you have any of these conditions: -blood disorders -heart disease, recent heart attack -infection (especially a virus infection such as chickenpox, cold sores, or herpes) -irregular heartbeat -liver disease -recent or ongoing radiation therapy -an unusual or allergic reaction to doxorubicin, other chemotherapy agents, other medicines, foods, dyes, or preservatives -pregnant or trying to get pregnant -breast-feeding How should I use this medicine? This drug is given as an infusion into a vein. It is administered in a hospital or clinic by a specially trained health care professional. If you have pain, swelling, burning or any unusual feeling around the site of your injection, tell your health care professional right away. Talk to your pediatrician regarding the use of this medicine in children. Special care may be needed. Overdosage: If you think you have taken too much of this medicine contact a poison control center or emergency room at once. NOTE: This medicine is only for you. Do not share this medicine with others. What if I miss a dose? It is important not to miss your dose. Call your doctor or health care professional if you are unable to keep an appointment. What may interact with this medicine? Do not take this medicine with any of the following medications: -cisapride -droperidol -halofantrine -pimozide -zidovudine This  medicine may also interact with the following medications: -chloroquine -chlorpromazine -clarithromycin -cyclophosphamide -cyclosporine -erythromycin -medicines for depression, anxiety, or psychotic disturbances -medicines for irregular heart beat like amiodarone, bepridil, dofetilide, encainide, flecainide, propafenone, quinidine -medicines for seizures like ethotoin, fosphenytoin, phenytoin -medicines for nausea, vomiting like dolasetron, ondansetron, palonosetron -medicines to increase blood counts like filgrastim, pegfilgrastim, sargramostim -methadone -methotrexate -pentamidine -progesterone -vaccines -verapamil Talk to your doctor or health care professional before taking any of these medicines: -acetaminophen -aspirin -ibuprofen -ketoprofen -naproxen This list may not describe all possible interactions. Give your health care provider a list of all the medicines, herbs, non-prescription drugs, or dietary supplements you use. Also tell them if you smoke, drink alcohol, or use illegal drugs. Some items may interact with your medicine. What should I watch for while using this medicine? Your condition will be monitored carefully while you are receiving this medicine. You will need important blood work done while you are taking this medicine. This drug may make you feel generally unwell. This is not uncommon, as chemotherapy can affect healthy cells as well as cancer cells. Report any side effects. Continue your course of treatment even though you feel ill unless your doctor tells you to stop. Your urine may turn red for a few days after your dose. This is not blood. If your urine is dark or brown, call your doctor. In some cases, you may be given additional medicines to help with side effects. Follow all directions for their use. Call your doctor or health care professional for advice if you get a fever, chills or sore throat, or other symptoms of a cold or flu. Do not treat yourself.  This drug decreases your body's ability to   fight infections. Try to avoid being around people who are sick. This medicine may increase your risk to bruise or bleed. Call your doctor or health care professional if you notice any unusual bleeding. Be careful brushing and flossing your teeth or using a toothpick because you may get an infection or bleed more easily. If you have any dental work done, tell your dentist you are receiving this medicine. Avoid taking products that contain aspirin, acetaminophen, ibuprofen, naproxen, or ketoprofen unless instructed by your doctor. These medicines may hide a fever. Men and women of childbearing age should use effective birth control methods while using taking this medicine. Do not become pregnant while taking this medicine. There is a potential for serious side effects to an unborn child. Talk to your health care professional or pharmacist for more information. Do not breast-feed an infant while taking this medicine. Do not let others touch your urine or other body fluids for 5 days after each treatment with this medicine. Caregivers should wear latex gloves to avoid touching body fluids during this time. There is a maximum amount of this medicine you should receive throughout your life. The amount depends on the medical condition being treated and your overall health. Your doctor will watch how much of this medicine you receive in your lifetime. Tell your doctor if you have taken this medicine before. What side effects may I notice from receiving this medicine? Side effects that you should report to your doctor or health care professional as soon as possible: -allergic reactions like skin rash, itching or hives, swelling of the face, lips, or tongue -low blood counts - this medicine may decrease the number of white blood cells, red blood cells and platelets. You may be at increased risk for infections and bleeding. -signs of infection - fever or chills, cough,  sore throat, pain or difficulty passing urine -signs of decreased platelets or bleeding - bruising, pinpoint red spots on the skin, black, tarry stools, blood in the urine -signs of decreased red blood cells - unusually weak or tired, fainting spells, lightheadedness -breathing problems -chest pain -fast, irregular heartbeat -mouth sores -nausea, vomiting -pain, swelling, redness at site where injected -pain, tingling, numbness in the hands or feet -swelling of ankles, feet, or hands -unusual bleeding or bruising Side effects that usually do not require medical attention (report to your doctor or health care professional if they continue or are bothersome): -diarrhea -facial flushing -hair loss -loss of appetite -missed menstrual periods -nail discoloration or damage -red or watery eyes -red colored urine -stomach upset This list may not describe all possible side effects. Call your doctor for medical advice about side effects. You may report side effects to FDA at 1-800-FDA-1088. Where should I keep my medicine? This drug is given in a hospital or clinic and will not be stored at home. NOTE: This sheet is a summary. It may not cover all possible information. If you have questions about this medicine, talk to your doctor, pharmacist, or health care provider.    2016, [employer]. (2012-11-11 09:54:34) Cyclophosphamide injection What is this medicine? CYCLOPHOSPHAMIDE (sye kloe FOSS fa mide) is a chemotherapy drug. It slows the growth of cancer cells. This medicine is used to treat many types of cancer like lymphoma, myeloma, leukemia, breast cancer, and ovarian cancer, to name a few. This medicine may be used for other purposes; ask your health care provider or pharmacist if you have questions. What should I tell my health care provider before I take   this medicine? They need to know if you have any of these conditions: -blood disorders -history of other  chemotherapy -infection -kidney disease -liver disease -recent or ongoing radiation therapy -tumors in the bone marrow -an unusual or allergic reaction to cyclophosphamide, other chemotherapy, other medicines, foods, dyes, or preservatives -pregnant or trying to get pregnant -breast-feeding How should I use this medicine? This drug is usually given as an injection into a vein or muscle or by infusion into a vein. It is administered in a hospital or clinic by a specially trained health care professional. Talk to your pediatrician regarding the use of this medicine in children. Special care may be needed. Overdosage: If you think you have taken too much of this medicine contact a poison control center or emergency room at once. NOTE: This medicine is only for you. Do not share this medicine with others. What if I miss a dose? It is important not to miss your dose. Call your doctor or health care professional if you are unable to keep an appointment. What may interact with this medicine? This medicine may interact with the following medications: -amiodarone -amphotericin B -azathioprine -certain antiviral medicines for HIV or AIDS such as protease inhibitors (e.g., indinavir, ritonavir) and zidovudine -certain blood pressure medications such as benazepril, captopril, enalapril, fosinopril, lisinopril, moexipril, monopril, perindopril, quinapril, ramipril, trandolapril -certain cancer medications such as anthracyclines (e.g., daunorubicin, doxorubicin), busulfan, cytarabine, paclitaxel, pentostatin, tamoxifen, trastuzumab -certain diuretics such as chlorothiazide, chlorthalidone, hydrochlorothiazide, indapamide, metolazone -certain medicines that treat or prevent blood clots like warfarin -certain muscle relaxants such as succinylcholine -cyclosporine -etanercept -indomethacin -medicines to increase blood counts like filgrastim, pegfilgrastim, sargramostim -medicines used as general  anesthesia -metronidazole -natalizumab This list may not describe all possible interactions. Give your health care provider a list of all the medicines, herbs, non-prescription drugs, or dietary supplements you use. Also tell them if you smoke, drink alcohol, or use illegal drugs. Some items may interact with your medicine. What should I watch for while using this medicine? Visit your doctor for checks on your progress. This drug may make you feel generally unwell. This is not uncommon, as chemotherapy can affect healthy cells as well as cancer cells. Report any side effects. Continue your course of treatment even though you feel ill unless your doctor tells you to stop. Drink water or other fluids as directed. Urinate often, even at night. In some cases, you may be given additional medicines to help with side effects. Follow all directions for their use. Call your doctor or health care professional for advice if you get a fever, chills or sore throat, or other symptoms of a cold or flu. Do not treat yourself. This drug decreases your body's ability to fight infections. Try to avoid being around people who are sick. This medicine may increase your risk to bruise or bleed. Call your doctor or health care professional if you notice any unusual bleeding. Be careful brushing and flossing your teeth or using a toothpick because you may get an infection or bleed more easily. If you have any dental work done, tell your dentist you are receiving this medicine. You may get drowsy or dizzy. Do not drive, use machinery, or do anything that needs mental alertness until you know how this medicine affects you. Do not become pregnant while taking this medicine or for 1 year after stopping it. Women should inform their doctor if they wish to become pregnant or think they might be pregnant. Men should not father a child   while taking this medicine and for 4 months after stopping it. There is a potential for serious side  effects to an unborn child. Talk to your health care professional or pharmacist for more information. Do not breast-feed an infant while taking this medicine. This medicine may interfere with the ability to have a child. This medicine has caused ovarian failure in some women. This medicine has caused reduced sperm counts in some men. You should talk with your doctor or health care professional if you are concerned about your fertility. If you are going to have surgery, tell your doctor or health care professional that you have taken this medicine. What side effects may I notice from receiving this medicine? Side effects that you should report to your doctor or health care professional as soon as possible: -allergic reactions like skin rash, itching or hives, swelling of the face, lips, or tongue -low blood counts - this medicine may decrease the number of white blood cells, red blood cells and platelets. You may be at increased risk for infections and bleeding. -signs of infection - fever or chills, cough, sore throat, pain or difficulty passing urine -signs of decreased platelets or bleeding - bruising, pinpoint red spots on the skin, black, tarry stools, blood in the urine -signs of decreased red blood cells - unusually weak or tired, fainting spells, lightheadedness -breathing problems -dark urine -dizziness -palpitations -swelling of the ankles, feet, hands -trouble passing urine or change in the amount of urine -weight gain -yellowing of the eyes or skin Side effects that usually do not require medical attention (report to your doctor or health care professional if they continue or are bothersome): -changes in nail or skin color -hair loss -missed menstrual periods -mouth sores -nausea, vomiting This list may not describe all possible side effects. Call your doctor for medical advice about side effects. You may report side effects to FDA at 1-800-FDA-1088. Where should I keep my  medicine? This drug is given in a hospital or clinic and will not be stored at home. NOTE: This sheet is a summary. It may not cover all possible information. If you have questions about this medicine, talk to your doctor, pharmacist, or health care provider.    2016, [employer]. (2012-05-30 16:22:58) Paclitaxel injection What is this medicine? PACLITAXEL (PAK li TAX el) is a chemotherapy drug. It targets fast dividing cells, like cancer cells, and causes these cells to die. This medicine is used to treat ovarian cancer, breast cancer, and other cancers. This medicine may be used for other purposes; ask your health care provider or pharmacist if you have questions. What should I tell my health care provider before I take this medicine? They need to know if you have any of these conditions: -blood disorders -irregular heartbeat -infection (especially a virus infection such as chickenpox, cold sores, or herpes) -liver disease -previous or ongoing radiation therapy -an unusual or allergic reaction to paclitaxel, alcohol, polyoxyethylated castor oil, other chemotherapy agents, other medicines, foods, dyes, or preservatives -pregnant or trying to get pregnant -breast-feeding How should I use this medicine? This drug is given as an infusion into a vein. It is administered in a hospital or clinic by a specially trained health care professional. Talk to your pediatrician regarding the use of this medicine in children. Special care may be needed. Overdosage: If you think you have taken too much of this medicine contact a poison control center or emergency room at once. NOTE: This medicine is only for you. Do   not share this medicine with others. What if I miss a dose? It is important not to miss your dose. Call your doctor or health care professional if you are unable to keep an appointment. What may interact with this medicine? Do not take this medicine with any of the following  medications: -disulfiram -metronidazole This medicine may also interact with the following medications: -cyclosporine -diazepam -ketoconazole -medicines to increase blood counts like filgrastim, pegfilgrastim, sargramostim -other chemotherapy drugs like cisplatin, doxorubicin, epirubicin, etoposide, teniposide, vincristine -quinidine -testosterone -vaccines -verapamil Talk to your doctor or health care professional before taking any of these medicines: -acetaminophen -aspirin -ibuprofen -ketoprofen -naproxen This list may not describe all possible interactions. Give your health care provider a list of all the medicines, herbs, non-prescription drugs, or dietary supplements you use. Also tell them if you smoke, drink alcohol, or use illegal drugs. Some items may interact with your medicine. What should I watch for while using this medicine? Your condition will be monitored carefully while you are receiving this medicine. You will need important blood work done while you are taking this medicine. This drug may make you feel generally unwell. This is not uncommon, as chemotherapy can affect healthy cells as well as cancer cells. Report any side effects. Continue your course of treatment even though you feel ill unless your doctor tells you to stop. This medicine can cause serious allergic reactions. To reduce your risk you will need to take other medicine(s) before treatment with this medicine. In some cases, you may be given additional medicines to help with side effects. Follow all directions for their use. Call your doctor or health care professional for advice if you get a fever, chills or sore throat, or other symptoms of a cold or flu. Do not treat yourself. This drug decreases your body's ability to fight infections. Try to avoid being around people who are sick. This medicine may increase your risk to bruise or bleed. Call your doctor or health care professional if you notice any  unusual bleeding. Be careful brushing and flossing your teeth or using a toothpick because you may get an infection or bleed more easily. If you have any dental work done, tell your dentist you are receiving this medicine. Avoid taking products that contain aspirin, acetaminophen, ibuprofen, naproxen, or ketoprofen unless instructed by your doctor. These medicines may hide a fever. Do not become pregnant while taking this medicine. Women should inform their doctor if they wish to become pregnant or think they might be pregnant. There is a potential for serious side effects to an unborn child. Talk to your health care professional or pharmacist for more information. Do not breast-feed an infant while taking this medicine. Men are advised not to father a child while receiving this medicine. This product may contain alcohol. Ask your pharmacist or healthcare provider if this medicine contains alcohol. Be sure to tell all healthcare providers you are taking this medicine. Certain medicines, like metronidazole and disulfiram, can cause an unpleasant reaction when taken with alcohol. The reaction includes flushing, headache, nausea, vomiting, sweating, and increased thirst. The reaction can last from 30 minutes to several hours. What side effects may I notice from receiving this medicine? Side effects that you should report to your doctor or health care professional as soon as possible: -allergic reactions like skin rash, itching or hives, swelling of the face, lips, or tongue -low blood counts - This drug may decrease the number of white blood cells, red blood cells and platelets.   You may be at increased risk for infections and bleeding. -signs of infection - fever or chills, cough, sore throat, pain or difficulty passing urine -signs of decreased platelets or bleeding - bruising, pinpoint red spots on the skin, black, tarry stools, nosebleeds -signs of decreased red blood cells - unusually weak or tired,  fainting spells, lightheadedness -breathing problems -chest pain -high or low blood pressure -mouth sores -nausea and vomiting -pain, swelling, redness or irritation at the injection site -pain, tingling, numbness in the hands or feet -slow or irregular heartbeat -swelling of the ankle, feet, hands Side effects that usually do not require medical attention (report to your doctor or health care professional if they continue or are bothersome): -bone pain -complete hair loss including hair on your head, underarms, pubic hair, eyebrows, and eyelashes -changes in the color of fingernails -diarrhea -loosening of the fingernails -loss of appetite -muscle or joint pain -red flush to skin -sweating This list may not describe all possible side effects. Call your doctor for medical advice about side effects. You may report side effects to FDA at 1-800-FDA-1088. Where should I keep my medicine? This drug is given in a hospital or clinic and will not be stored at home. NOTE: This sheet is a summary. It may not cover all possible information. If you have questions about this medicine, talk to your doctor, pharmacist, or health care provider.    2016, [employer]. (2015-03-03 13:02:56) Pegfilgrastim injection What is this medicine? PEGFILGRASTIM (PEG fil gra stim) is a long-acting granulocyte colony-stimulating factor that stimulates the growth of neutrophils, a type of white blood cell important in the body's fight against infection. It is used to reduce the incidence of fever and infection in patients with certain types of cancer who are receiving chemotherapy that affects the bone marrow, and to increase survival after being exposed to high doses of radiation. This medicine may be used for other purposes; ask your health care provider or pharmacist if you have questions. What should I tell my health care provider before I take this medicine? They need to know if you have any of these  conditions: -kidney disease -latex allergy -ongoing radiation therapy -sickle cell disease -skin reactions to acrylic adhesives (On-Body Injector only) -an unusual or allergic reaction to pegfilgrastim, filgrastim, other medicines, foods, dyes, or preservatives -pregnant or trying to get pregnant -breast-feeding How should I use this medicine? This medicine is for injection under the skin. If you get this medicine at home, you will be taught how to prepare and give the pre-filled syringe or how to use the On-body Injector. Refer to the patient Instructions for Use for detailed instructions. Use exactly as directed. Take your medicine at regular intervals. Do not take your medicine more often than directed. It is important that you put your used needles and syringes in a special sharps container. Do not put them in a trash can. If you do not have a sharps container, call your pharmacist or healthcare provider to get one. Talk to your pediatrician regarding the use of this medicine in children. While this drug may be prescribed for selected conditions, precautions do apply. Overdosage: If you think you have taken too much of this medicine contact a poison control center or emergency room at once. NOTE: This medicine is only for you. Do not share this medicine with others. What if I miss a dose? It is important not to miss your dose. Call your doctor or health care professional if you miss your   dose. If you miss a dose due to an On-body Injector failure or leakage, a new dose should be administered as soon as possible using a single prefilled syringe for manual use. What may interact with this medicine? Interactions have not been studied. Give your health care provider a list of all the medicines, herbs, non-prescription drugs, or dietary supplements you use. Also tell them if you smoke, drink alcohol, or use illegal drugs. Some items may interact with your medicine. This list may not describe all  possible interactions. Give your health care provider a list of all the medicines, herbs, non-prescription drugs, or dietary supplements you use. Also tell them if you smoke, drink alcohol, or use illegal drugs. Some items may interact with your medicine. What should I watch for while using this medicine? You may need blood work done while you are taking this medicine. If you are going to need a MRI, CT scan, or other procedure, tell your doctor that you are using this medicine (On-Body Injector only). What side effects may I notice from receiving this medicine? Side effects that you should report to your doctor or health care professional as soon as possible: -allergic reactions like skin rash, itching or hives, swelling of the face, lips, or tongue -dizziness -fever -pain, redness, or irritation at site where injected -pinpoint red spots on the skin -red or dark-brown urine -shortness of breath or breathing problems -stomach or side pain, or pain at the shoulder -swelling -tiredness -trouble passing urine or change in the amount of urine Side effects that usually do not require medical attention (report to your doctor or health care professional if they continue or are bothersome): -bone pain -muscle pain This list may not describe all possible side effects. Call your doctor for medical advice about side effects. You may report side effects to FDA at 1-800-FDA-1088. Where should I keep my medicine? Keep out of the reach of children. Store pre-filled syringes in a refrigerator between 2 and 8 degrees C (36 and 46 degrees F). Do not freeze. Keep in carton to protect from light. Throw away this medicine if it is left out of the refrigerator for more than 48 hours. Throw away any unused medicine after the expiration date. NOTE: This sheet is a summary. It may not cover all possible information. If you have questions about this medicine, talk to your doctor, pharmacist, or health care  provider.    2016, Elsevier/Gold Standard. (2014-08-05 14:30:14)  

## 2016-02-15 ENCOUNTER — Encounter: Payer: Self-pay | Admitting: *Deleted

## 2016-02-16 ENCOUNTER — Inpatient Hospital Stay: Payer: Medicare Other | Attending: Oncology

## 2016-02-16 DIAGNOSIS — Z8669 Personal history of other diseases of the nervous system and sense organs: Secondary | ICD-10-CM | POA: Insufficient documentation

## 2016-02-16 DIAGNOSIS — K219 Gastro-esophageal reflux disease without esophagitis: Secondary | ICD-10-CM | POA: Insufficient documentation

## 2016-02-16 DIAGNOSIS — Z8673 Personal history of transient ischemic attack (TIA), and cerebral infarction without residual deficits: Secondary | ICD-10-CM | POA: Insufficient documentation

## 2016-02-16 DIAGNOSIS — E119 Type 2 diabetes mellitus without complications: Secondary | ICD-10-CM | POA: Insufficient documentation

## 2016-02-16 DIAGNOSIS — E7800 Pure hypercholesterolemia, unspecified: Secondary | ICD-10-CM | POA: Insufficient documentation

## 2016-02-16 DIAGNOSIS — C50212 Malignant neoplasm of upper-inner quadrant of left female breast: Secondary | ICD-10-CM | POA: Insufficient documentation

## 2016-02-16 DIAGNOSIS — Z79899 Other long term (current) drug therapy: Secondary | ICD-10-CM | POA: Insufficient documentation

## 2016-02-16 DIAGNOSIS — Z7689 Persons encountering health services in other specified circumstances: Secondary | ICD-10-CM | POA: Insufficient documentation

## 2016-02-16 DIAGNOSIS — E039 Hypothyroidism, unspecified: Secondary | ICD-10-CM | POA: Insufficient documentation

## 2016-02-16 DIAGNOSIS — I10 Essential (primary) hypertension: Secondary | ICD-10-CM | POA: Insufficient documentation

## 2016-02-16 DIAGNOSIS — Z803 Family history of malignant neoplasm of breast: Secondary | ICD-10-CM | POA: Insufficient documentation

## 2016-02-16 DIAGNOSIS — M1990 Unspecified osteoarthritis, unspecified site: Secondary | ICD-10-CM | POA: Insufficient documentation

## 2016-02-16 DIAGNOSIS — Z171 Estrogen receptor negative status [ER-]: Secondary | ICD-10-CM | POA: Insufficient documentation

## 2016-02-16 DIAGNOSIS — M818 Other osteoporosis without current pathological fracture: Secondary | ICD-10-CM | POA: Insufficient documentation

## 2016-02-16 DIAGNOSIS — F419 Anxiety disorder, unspecified: Secondary | ICD-10-CM | POA: Insufficient documentation

## 2016-02-24 ENCOUNTER — Inpatient Hospital Stay (HOSPITAL_BASED_OUTPATIENT_CLINIC_OR_DEPARTMENT_OTHER): Payer: Medicare Other | Admitting: Oncology

## 2016-02-24 ENCOUNTER — Inpatient Hospital Stay: Payer: Medicare Other

## 2016-02-24 VITALS — BP 164/78 | HR 85 | Temp 97.5°F | Resp 18 | Wt 160.5 lb

## 2016-02-24 VITALS — BP 122/67 | HR 66 | Temp 97.1°F | Resp 18

## 2016-02-24 DIAGNOSIS — F419 Anxiety disorder, unspecified: Secondary | ICD-10-CM | POA: Diagnosis not present

## 2016-02-24 DIAGNOSIS — E039 Hypothyroidism, unspecified: Secondary | ICD-10-CM

## 2016-02-24 DIAGNOSIS — K219 Gastro-esophageal reflux disease without esophagitis: Secondary | ICD-10-CM

## 2016-02-24 DIAGNOSIS — Z7689 Persons encountering health services in other specified circumstances: Secondary | ICD-10-CM | POA: Diagnosis not present

## 2016-02-24 DIAGNOSIS — Z171 Estrogen receptor negative status [ER-]: Secondary | ICD-10-CM | POA: Diagnosis not present

## 2016-02-24 DIAGNOSIS — E7800 Pure hypercholesterolemia, unspecified: Secondary | ICD-10-CM | POA: Diagnosis not present

## 2016-02-24 DIAGNOSIS — M818 Other osteoporosis without current pathological fracture: Secondary | ICD-10-CM | POA: Diagnosis not present

## 2016-02-24 DIAGNOSIS — M1990 Unspecified osteoarthritis, unspecified site: Secondary | ICD-10-CM | POA: Diagnosis not present

## 2016-02-24 DIAGNOSIS — I10 Essential (primary) hypertension: Secondary | ICD-10-CM | POA: Diagnosis not present

## 2016-02-24 DIAGNOSIS — C50212 Malignant neoplasm of upper-inner quadrant of left female breast: Secondary | ICD-10-CM

## 2016-02-24 DIAGNOSIS — Z803 Family history of malignant neoplasm of breast: Secondary | ICD-10-CM | POA: Diagnosis not present

## 2016-02-24 DIAGNOSIS — Z8673 Personal history of transient ischemic attack (TIA), and cerebral infarction without residual deficits: Secondary | ICD-10-CM | POA: Diagnosis not present

## 2016-02-24 DIAGNOSIS — Z8669 Personal history of other diseases of the nervous system and sense organs: Secondary | ICD-10-CM

## 2016-02-24 DIAGNOSIS — Z79899 Other long term (current) drug therapy: Secondary | ICD-10-CM

## 2016-02-24 DIAGNOSIS — E119 Type 2 diabetes mellitus without complications: Secondary | ICD-10-CM | POA: Diagnosis not present

## 2016-02-24 LAB — COMPREHENSIVE METABOLIC PANEL
ALK PHOS: 151 U/L — AB (ref 38–126)
ALT: 48 U/L (ref 14–54)
ANION GAP: 6 (ref 5–15)
AST: 43 U/L — ABNORMAL HIGH (ref 15–41)
Albumin: 4 g/dL (ref 3.5–5.0)
BILIRUBIN TOTAL: 0.9 mg/dL (ref 0.3–1.2)
BUN: 20 mg/dL (ref 6–20)
CALCIUM: 8.7 mg/dL — AB (ref 8.9–10.3)
CO2: 28 mmol/L (ref 22–32)
CREATININE: 0.87 mg/dL (ref 0.44–1.00)
Chloride: 102 mmol/L (ref 101–111)
Glucose, Bld: 137 mg/dL — ABNORMAL HIGH (ref 65–99)
Potassium: 3.6 mmol/L (ref 3.5–5.1)
Sodium: 136 mmol/L (ref 135–145)
TOTAL PROTEIN: 6.9 g/dL (ref 6.5–8.1)

## 2016-02-24 LAB — CBC WITH DIFFERENTIAL/PLATELET
Basophils Absolute: 0 10*3/uL (ref 0–0.1)
Basophils Relative: 1 %
EOS ABS: 0.1 10*3/uL (ref 0–0.7)
Eosinophils Relative: 2 %
HEMATOCRIT: 36.4 % (ref 35.0–47.0)
HEMOGLOBIN: 12.6 g/dL (ref 12.0–16.0)
LYMPHS ABS: 1.2 10*3/uL (ref 1.0–3.6)
LYMPHS PCT: 24 %
MCH: 30.4 pg (ref 26.0–34.0)
MCHC: 34.6 g/dL (ref 32.0–36.0)
MCV: 87.9 fL (ref 80.0–100.0)
MONOS PCT: 8 %
Monocytes Absolute: 0.4 10*3/uL (ref 0.2–0.9)
NEUTROS PCT: 65 %
Neutro Abs: 3.4 10*3/uL (ref 1.4–6.5)
Platelets: 208 10*3/uL (ref 150–440)
RBC: 4.14 MIL/uL (ref 3.80–5.20)
RDW: 13.6 % (ref 11.5–14.5)
WBC: 5.1 10*3/uL (ref 3.6–11.0)

## 2016-02-24 MED ORDER — SODIUM CHLORIDE 0.9 % IV SOLN
Freq: Once | INTRAVENOUS | Status: AC
  Administered 2016-02-24: 10:00:00 via INTRAVENOUS
  Filled 2016-02-24: qty 5

## 2016-02-24 MED ORDER — HEPARIN SOD (PORK) LOCK FLUSH 100 UNIT/ML IV SOLN
500.0000 [IU] | Freq: Once | INTRAVENOUS | Status: AC | PRN
Start: 2016-02-24 — End: 2016-02-24
  Administered 2016-02-24: 500 [IU]
  Filled 2016-02-24: qty 5

## 2016-02-24 MED ORDER — DOXORUBICIN HCL CHEMO IV INJECTION 2 MG/ML
60.0000 mg/m2 | Freq: Once | INTRAVENOUS | Status: AC
  Administered 2016-02-24: 106 mg via INTRAVENOUS
  Filled 2016-02-24: qty 53

## 2016-02-24 MED ORDER — PEGFILGRASTIM 6 MG/0.6ML ~~LOC~~ PSKT
6.0000 mg | PREFILLED_SYRINGE | Freq: Once | SUBCUTANEOUS | Status: AC
  Administered 2016-02-24: 6 mg via SUBCUTANEOUS

## 2016-02-24 MED ORDER — SODIUM CHLORIDE 0.9% FLUSH
10.0000 mL | INTRAVENOUS | Status: DC | PRN
  Administered 2016-02-24: 10 mL
  Filled 2016-02-24: qty 10

## 2016-02-24 MED ORDER — PALONOSETRON HCL INJECTION 0.25 MG/5ML
0.2500 mg | Freq: Once | INTRAVENOUS | Status: AC
  Administered 2016-02-24: 0.25 mg via INTRAVENOUS
  Filled 2016-02-24: qty 5

## 2016-02-24 MED ORDER — SODIUM CHLORIDE 0.9 % IV SOLN
Freq: Once | INTRAVENOUS | Status: AC
  Administered 2016-02-24: 10:00:00 via INTRAVENOUS
  Filled 2016-02-24: qty 1000

## 2016-02-24 MED ORDER — SODIUM CHLORIDE 0.9 % IV SOLN
1000.0000 mg | Freq: Once | INTRAVENOUS | Status: AC
  Administered 2016-02-24: 1000 mg via INTRAVENOUS
  Filled 2016-02-24: qty 50

## 2016-02-24 NOTE — Progress Notes (Signed)
.  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  She is anxious, but otherwise feels well. She has no neurologic complaints. She denies any recent fevers or illnesses. She has a good appetite and denies weight loss. She denies any pain. She denies any chest pain or shortness of breath. She denies any nausea, vomiting, constipation, or diarrhea. She has no urinary complaints. Patient offers no specific complaints today.  REVIEW OF SYSTEMS:   Review of Systems  Constitutional: Negative.  Negative for fever, malaise/fatigue and weight loss.  Respiratory: Negative.  Negative for cough and shortness of breath.   Cardiovascular: Negative.  Negative for chest pain.  Gastrointestinal: Negative.  Negative for abdominal pain.  Genitourinary: Negative.   Musculoskeletal: Negative.   Neurological: Negative.  Negative for weakness.  Psychiatric/Behavioral: The patient is nervous/anxious.     As per HPI. Otherwise, a complete review of systems is negatve.  PAST MEDICAL HISTORY: Past Medical History:  Diagnosis Date  . DM (diabetes mellitus) ([place])    diet controlled  . Generalized OA   . GERD (gastroesophageal reflux disease)   . Hypercholesteremia   . Hypertension   . Hypothyroid   . Migraine   . Osteoporosis   . Stroke ([place])   . TIA (transient ischemic attack)    July    PAST SURGICAL HISTORY: Past Surgical History:  Procedure Laterality Date  . BREAST BIOPSY Left    bx/clip-neg  . BREAST BIOPSY Left 12/15/2015   path pending/us and stereo bx  . CATARACT EXTRACTION    .  DILATION AND CURETTAGE OF UTERUS    . EYE SURGERY Right    Cataract Extraction with IOL  . PARTIAL MASTECTOMY WITH AXILLARY SENTINEL LYMPH NODE BIOPSY Left 01/13/2016   Procedure: PARTIAL MASTECTOMY;  Surgeon: Leonie Green, MD;  Location: [facility] ORS;  Service: General;  Laterality: Left;  . PORTACATH PLACEMENT Right 01/13/2016   Procedure: INSERTION PORT-A-CATH;  Surgeon: Leonie Green, MD;  Location: [facility] ORS;  Service: General;  Laterality: Right;  . RETINAL DETACHMENT SURGERY Right    Dr. Starling Manns, [facility]  . TONSILLECTOMY      FAMILY HISTORY Family History  Problem Relation Age of Onset  . Hypertension Mother   . Osteoarthritis Mother   . Dementia Father   . Breast cancer Cousin 41    mat cousin       ADVANCED DIRECTIVES:    HEALTH MAINTENANCE: Social History  Substance Use Topics  . Smoking status: Never Smoker  . Smokeless tobacco: Never Used  . Alcohol use No     Colonoscopy:  PAP:  Bone density:  Lipid panel:  No Known Allergies  Current Outpatient Prescriptions  Medication Sig Dispense Refill  . atorvastatin (LIPITOR) 80 MG tablet Take 1 tablet (80 mg total) by mouth daily at 6 PM. 30 tablet 0  . hydrochlorothiazide (HYDRODIURIL) 12.5 MG tablet Take 12.5 mg by mouth daily.     Marland Kitchen levothyroxine (SYNTHROID, LEVOTHROID) 50 MCG tablet Take 1 tablet by mouth daily.  0  . lidocaine-prilocaine (EMLA) cream Apply to affected area once 30 g 3  . ondansetron (ZOFRAN) 8  MG tablet Take 1 tablet (8 mg total) by mouth 2 (two) times daily as needed. 30 tablet 1  . prochlorperazine (COMPAZINE) 10 MG tablet Take 1 tablet (10 mg total) by mouth every 6 (six) hours as needed (Nausea or vomiting). 30 tablet 1  . Vitamin D, Ergocalciferol, (DRISDOL) 50000 UNITS CAPS capsule Take 2,000 Units by mouth daily.      No current facility-administered medications for this visit.     OBJECTIVE: Vitals:   02/24/16 0925  BP: (!) 164/78  Pulse: 85  Resp: 18  Temp: 97.5 F  (36.4 C)     Body mass index is 29.35 kg/m.    ECOG FS:0 - Asymptomatic  General: Well-developed, well-nourished, no acute distress. Eyes: Pink conjunctiva, anicteric sclera. Breasts: Patient requested exam be deferred today. Lungs: Clear to auscultation bilaterally. Heart: Regular rate and rhythm. No rubs, murmurs, or gallops. Abdomen: Soft, nontender, nondistended. No organomegaly noted, normoactive bowel sounds. Musculoskeletal: No edema, cyanosis, or clubbing. Neuro: Alert, answering all questions appropriately. Cranial nerves grossly intact. Skin: No rashes or petechiae noted. Psych: Normal affect.   LAB RESULTS:  Lab Results  Component Value Date   NA 136 02/24/2016   K 3.6 02/24/2016   CL 102 02/24/2016   CO2 28 02/24/2016   GLUCOSE 137 (H) 02/24/2016   BUN 20 02/24/2016   CREATININE 0.87 02/24/2016   CALCIUM 8.7 (L) 02/24/2016   PROT 6.9 02/24/2016   ALBUMIN 4.0 02/24/2016   AST 43 (H) 02/24/2016   ALT 48 02/24/2016   ALKPHOS 151 (H) 02/24/2016   BILITOT 0.9 02/24/2016   GFRNONAA >60 02/24/2016   GFRAA >60 02/24/2016    Lab Results  Component Value Date   WBC 5.1 02/24/2016   NEUTROABS 3.4 02/24/2016   HGB 12.6 02/24/2016   HCT 36.4 02/24/2016   MCV 87.9 02/24/2016   PLT 208 02/24/2016     STUDIES: Nm Cardiac Muga Rest  Result Date: 02/14/2016 CLINICAL DATA:  Breast cancer. EXAM: NUCLEAR MEDICINE CARDIAC BLOOD POOL IMAGING (MUGA) TECHNIQUE: Cardiac multi-gated acquisition was performed at rest following intravenous injection of Tc-67m labeled red blood cells. RADIOPHARMACEUTICALS:  22.56 mCi Tc-61m MDP in-vitro labeled red blood cells IV COMPARISON:  None. FINDINGS: The left ventricular ejection fraction is equal to 64.5%. There is normal left ventricular wall motion identified. IMPRESSION: 1. Normal left ventricular ejection fraction equal to 64.5%. Electronically Signed   By: Kerby Moors M.D.   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  PLAN:    1. 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 Pretreatment MUGA was 64.5%.  Proceed with cycle 1 of 4 of adjuvant Adriamycin and Cytoxan today.  Will use OnPro Neulasta support.  Because patient had modified mastectomy, she will not require adjuvant XRT. Although will further discuss with radiation oncology at the conclusion of her chemotherapy given her 2 positive axillary lymph nodes.  She will also require 12 weekly cycles Taxol at the conclusion of her AC.  An aromatase inhibitor would offer no benefit given the triple negative status of her disease. Return to cycle in 1 week for further evaluation and to assess her toleration of cycle 1. 2.  Hypertension:  Patient's blood pressure is elevated  today. Monitor. Continue current medications.  Patient expressed understanding and was in agreement with this plan. She also understands that She can call clinic at any time with any questions, concerns, or complaints.   Breast cancer of upper-inner quadrant of left female breast [facility])   Staging form: Breast, AJCC 7th Edition     Pathologic stage from 01/06/2016: Stage IIA (T1c, N1a, M0) - Signed by Lloyd Huger, MD on 01/06/2016   Lloyd Huger, MD   02/24/2016 10:01 PM

## 2016-02-24 NOTE — Progress Notes (Signed)
States is feeling well. Has questions for Dr. Grayland Ormond regarding if can take OTC medication for migraines.

## 2016-03-02 ENCOUNTER — Inpatient Hospital Stay: Payer: Medicare Other | Attending: Oncology

## 2016-03-02 ENCOUNTER — Encounter: Payer: Self-pay | Admitting: Oncology

## 2016-03-02 ENCOUNTER — Inpatient Hospital Stay (HOSPITAL_BASED_OUTPATIENT_CLINIC_OR_DEPARTMENT_OTHER): Payer: Medicare Other | Admitting: Oncology

## 2016-03-02 VITALS — BP 149/79 | HR 102 | Temp 97.1°F | Resp 17 | Ht 62.0 in | Wt 158.6 lb

## 2016-03-02 DIAGNOSIS — Z171 Estrogen receptor negative status [ER-]: Secondary | ICD-10-CM | POA: Diagnosis not present

## 2016-03-02 DIAGNOSIS — K1370 Unspecified lesions of oral mucosa: Secondary | ICD-10-CM | POA: Insufficient documentation

## 2016-03-02 DIAGNOSIS — F419 Anxiety disorder, unspecified: Secondary | ICD-10-CM | POA: Diagnosis not present

## 2016-03-02 DIAGNOSIS — Z803 Family history of malignant neoplasm of breast: Secondary | ICD-10-CM | POA: Diagnosis not present

## 2016-03-02 DIAGNOSIS — M1990 Unspecified osteoarthritis, unspecified site: Secondary | ICD-10-CM

## 2016-03-02 DIAGNOSIS — D701 Agranulocytosis secondary to cancer chemotherapy: Secondary | ICD-10-CM

## 2016-03-02 DIAGNOSIS — K219 Gastro-esophageal reflux disease without esophagitis: Secondary | ICD-10-CM | POA: Insufficient documentation

## 2016-03-02 DIAGNOSIS — Z5111 Encounter for antineoplastic chemotherapy: Secondary | ICD-10-CM | POA: Insufficient documentation

## 2016-03-02 DIAGNOSIS — Z8669 Personal history of other diseases of the nervous system and sense organs: Secondary | ICD-10-CM | POA: Diagnosis not present

## 2016-03-02 DIAGNOSIS — E039 Hypothyroidism, unspecified: Secondary | ICD-10-CM | POA: Diagnosis not present

## 2016-03-02 DIAGNOSIS — Z79899 Other long term (current) drug therapy: Secondary | ICD-10-CM | POA: Diagnosis not present

## 2016-03-02 DIAGNOSIS — M818 Other osteoporosis without current pathological fracture: Secondary | ICD-10-CM

## 2016-03-02 DIAGNOSIS — E119 Type 2 diabetes mellitus without complications: Secondary | ICD-10-CM

## 2016-03-02 DIAGNOSIS — Z8673 Personal history of transient ischemic attack (TIA), and cerebral infarction without residual deficits: Secondary | ICD-10-CM | POA: Diagnosis not present

## 2016-03-02 DIAGNOSIS — T451X5S Adverse effect of antineoplastic and immunosuppressive drugs, sequela: Secondary | ICD-10-CM | POA: Diagnosis not present

## 2016-03-02 DIAGNOSIS — D696 Thrombocytopenia, unspecified: Secondary | ICD-10-CM | POA: Insufficient documentation

## 2016-03-02 DIAGNOSIS — C50212 Malignant neoplasm of upper-inner quadrant of left female breast: Secondary | ICD-10-CM

## 2016-03-02 DIAGNOSIS — I10 Essential (primary) hypertension: Secondary | ICD-10-CM | POA: Diagnosis not present

## 2016-03-02 DIAGNOSIS — E7800 Pure hypercholesterolemia, unspecified: Secondary | ICD-10-CM | POA: Diagnosis not present

## 2016-03-02 DIAGNOSIS — Z7689 Persons encountering health services in other specified circumstances: Secondary | ICD-10-CM | POA: Insufficient documentation

## 2016-03-02 LAB — COMPREHENSIVE METABOLIC PANEL
ALBUMIN: 3.9 g/dL (ref 3.5–5.0)
ALT: 45 U/L (ref 14–54)
ANION GAP: 7 (ref 5–15)
AST: 27 U/L (ref 15–41)
Alkaline Phosphatase: 139 U/L — ABNORMAL HIGH (ref 38–126)
BUN: 19 mg/dL (ref 6–20)
CALCIUM: 8.9 mg/dL (ref 8.9–10.3)
CHLORIDE: 99 mmol/L — AB (ref 101–111)
CO2: 27 mmol/L (ref 22–32)
Creatinine, Ser: 0.75 mg/dL (ref 0.44–1.00)
GFR calc non Af Amer: >60 mL/min (ref >60)
Glucose, Bld: 201 mg/dL — ABNORMAL HIGH (ref 65–99)
Potassium: 3.5 mmol/L (ref 3.5–5.1)
SODIUM: 133 mmol/L — AB (ref 135–145)
Total Bilirubin: 1.3 mg/dL — ABNORMAL HIGH (ref 0.3–1.2)
Total Protein: 6.8 g/dL (ref 6.5–8.1)

## 2016-03-02 LAB — CBC WITH DIFFERENTIAL/PLATELET
BASOS ABS: 0 10*3/uL (ref 0–0.1)
Eosinophils Absolute: 0.1 10*3/uL (ref 0–0.7)
HCT: 35.9 % (ref 35.0–47.0)
Hemoglobin: 12.3 g/dL (ref 12.0–16.0)
Lymphocytes Relative: 76 %
Lymphs Abs: 0.6 10*3/uL — ABNORMAL LOW (ref 1.0–3.6)
MCH: 29.9 pg (ref 26.0–34.0)
MCHC: 34.1 g/dL (ref 32.0–36.0)
MCV: 87.6 fL (ref 80.0–100.0)
MONO ABS: 0 10*3/uL — AB (ref 0.2–0.9)
Monocytes Relative: 4 %
NEUTROS ABS: 0 10*3/uL — AB (ref 1.4–6.5)
Neutrophils Relative %: 6 %
PLATELETS: 116 10*3/uL — AB (ref 150–440)
RBC: 4.11 MIL/uL (ref 3.80–5.20)
RDW: 13 % (ref 11.5–14.5)
WBC: 0.7 10*3/uL — CL (ref 3.6–11.0)

## 2016-03-02 NOTE — Progress Notes (Signed)
Pt reports has had loose stools for last two days.  Pt complains that she has lower abdominal pain and pain and pain in her "backside" when having a BM.  Pt is going to see PCP this afternoon.

## 2016-03-02 NOTE — Progress Notes (Signed)
.  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 Patient tolerated her treatment well without significant side effects. She has no neurologic complaints. She denies any recent fevers or illnesses. She has a good appetite and denies weight loss. She denies any pain. She denies any chest pain or shortness of breath. She denies any nausea, vomiting, constipation, or diarrhea. She has no urinary complaints. Patient offers no specific complaints today.  REVIEW OF SYSTEMS:   Review of Systems  Constitutional: Negative.  Negative for fever, malaise/fatigue and weight loss.  Respiratory: Negative.  Negative for cough and shortness of breath.   Cardiovascular: Negative.  Negative for chest pain.  Gastrointestinal: Negative.  Negative for abdominal pain.  Genitourinary: Negative.   Musculoskeletal: Negative.   Neurological: Negative.  Negative for weakness.  Psychiatric/Behavioral: The patient is nervous/anxious.     As per HPI. Otherwise, a complete review of systems is negatve.  PAST MEDICAL HISTORY: Past Medical History:  Diagnosis Date  . DM (diabetes mellitus) ([place])    diet controlled  . Generalized OA   . GERD (gastroesophageal reflux disease)   . Hypercholesteremia   . Hypertension   . Hypothyroid   . Migraine   . Osteoporosis   . Stroke ([place])   . TIA (transient ischemic attack)    July    PAST SURGICAL HISTORY: Past Surgical History:  Procedure Laterality Date  . BREAST BIOPSY Left    bx/clip-neg  . BREAST BIOPSY Left 12/15/2015   path pending/us and  stereo bx  . CATARACT EXTRACTION    . DILATION AND CURETTAGE OF UTERUS    . EYE SURGERY Right    Cataract Extraction with IOL  . PARTIAL MASTECTOMY WITH AXILLARY SENTINEL LYMPH NODE BIOPSY Left 01/13/2016   Procedure: PARTIAL MASTECTOMY;  Surgeon: Leonie Green, MD;  Location: [facility] ORS;  Service: General;  Laterality: Left;  . PORTACATH PLACEMENT Right 01/13/2016   Procedure: INSERTION PORT-A-CATH;  Surgeon: Leonie Green, MD;  Location: [facility] ORS;  Service: General;  Laterality: Right;  . RETINAL DETACHMENT SURGERY Right    Dr. Starling Levy, [facility]  . TONSILLECTOMY      FAMILY HISTORY Family History  Problem Relation Age of Onset  . Hypertension Mother   . Osteoarthritis Mother   . Dementia Father   . Breast cancer Cousin 25    mat cousin       ADVANCED DIRECTIVES:    HEALTH MAINTENANCE: Social History  Substance Use Topics  . Smoking status: Never Smoker  . Smokeless tobacco: Never Used  . Alcohol use No     Colonoscopy:  PAP:  Bone density:  Lipid panel:  No Known Allergies  Current Outpatient Prescriptions  Medication Sig Dispense Refill  . atorvastatin (LIPITOR) 80 MG tablet Take 1 tablet (80 mg total) by mouth daily at 6 PM. 30 tablet 0  . hydrochlorothiazide (HYDRODIURIL) 12.5 MG tablet Take 12.5 mg by mouth daily.     Marland Kitchen levothyroxine (SYNTHROID, LEVOTHROID) 50 MCG tablet Take 1 tablet by mouth daily.  0  . lidocaine-prilocaine (EMLA) cream Apply to affected area once 30 g 3  . Vitamin  D, Ergocalciferol, (DRISDOL) 50000 UNITS CAPS capsule Take 2,000 Units by mouth daily.     . ondansetron (ZOFRAN) 8 MG tablet Take 1 tablet (8 mg total) by mouth 2 (two) times daily as needed. (Patient not taking: Reported on 03/02/2016) 30 tablet 1  . prochlorperazine (COMPAZINE) 10 MG tablet Take 1 tablet (10 mg total) by mouth every 6 (six) hours as needed (Nausea or vomiting). (Patient not taking: Reported on 03/02/2016) 30 tablet 1   No current facility-administered  medications for this visit.     OBJECTIVE: Vitals:   03/02/16 1011  BP: (!) 149/79  Pulse: (!) 102  Resp: 17  Temp: 97.1 F (36.2 C)     Body mass index is 29.01 kg/m.    ECOG FS:0 - Asymptomatic  General: Well-developed, well-nourished, no acute distress. Eyes: Pink conjunctiva, anicteric sclera. Breasts: Patient requested exam be deferred today. Lungs: Clear to auscultation bilaterally. Heart: Regular rate and rhythm. No rubs, murmurs, or gallops. Abdomen: Soft, nontender, nondistended. No organomegaly noted, normoactive bowel sounds. Musculoskeletal: No edema, cyanosis, or clubbing. Neuro: Alert, answering all questions appropriately. Cranial nerves grossly intact. Skin: No rashes or petechiae noted. Psych: Normal affect.   LAB RESULTS:  Lab Results  Component Value Date   NA 133 (L) 03/02/2016   K 3.5 03/02/2016   CL 99 (L) 03/02/2016   CO2 27 03/02/2016   GLUCOSE 201 (H) 03/02/2016   BUN 19 03/02/2016   CREATININE 0.75 03/02/2016   CALCIUM 8.9 03/02/2016   PROT 6.8 03/02/2016   ALBUMIN 3.9 03/02/2016   AST 27 03/02/2016   ALT 45 03/02/2016   ALKPHOS 139 (H) 03/02/2016   BILITOT 1.3 (H) 03/02/2016   GFRNONAA >60 03/02/2016   GFRAA >60 03/02/2016    Lab Results  Component Value Date   WBC 0.7 (LL) 03/02/2016   NEUTROABS 0.0 (L) 03/02/2016   HGB 12.3 03/02/2016   HCT 35.9 03/02/2016   MCV 87.6 03/02/2016   PLT 116 (L) 03/02/2016     STUDIES: Nm Cardiac Muga Rest  Result Date: 02/14/2016 CLINICAL DATA:  Breast cancer. EXAM: NUCLEAR MEDICINE CARDIAC BLOOD POOL IMAGING (MUGA) TECHNIQUE: Cardiac multi-gated acquisition was performed at rest following intravenous injection of Tc-63m labeled red blood cells. RADIOPHARMACEUTICALS:  22.56 mCi Tc-52m MDP in-vitro labeled red blood cells IV COMPARISON:  None. FINDINGS: The left ventricular ejection fraction is equal to 64.5%. There is normal left ventricular wall motion identified. IMPRESSION: 1. Normal left  ventricular ejection fraction equal to 64.5%. Electronically Signed   By: Kerby Moors M.D.   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  PLAN:    1. 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 Pretreatment MUGA was 64.5%.  Patient tolerated cycle 1 of 4 of adjuvant Adriamycin and Cytoxan last week without significant side effects.  She also received OnPro Neulasta support.  Because patient had modified mastectomy, she will not require adjuvant XRT. Although will further discuss with radiation oncology at the conclusion of her chemotherapy given her 2 positive axillary lymph nodes.  She will also require 12 weekly cycles Taxol at the conclusion of her AC.  An aromatase inhibitor would offer no benefit given the triple negative status of her disease. Return  to cycle in 1 week for for consideration of cycle 2 and then in 3 weeks for further evaluation and consideration of cycle 3. 2.  Hypertension:  Patient's blood pressure is elevated today. Monitor. Continue current medications. 3. Neutropenia: Secondary to chemotherapy. Patient received Neulasta with her treatment. Monitor.  Patient expressed understanding and was in agreement with this plan. She also understands that She can call clinic at any time with any questions, concerns, or complaints.   Breast cancer of upper-inner quadrant of left female breast [facility])   Staging form: Breast, AJCC 7th Edition     Pathologic stage from 01/06/2016: Stage IIA (T1c, N1a, M0) - Signed by Lloyd Huger, MD on 01/06/2016   Lloyd Huger, MD   03/02/2016 6:02 PM

## 2016-03-08 ENCOUNTER — Other Ambulatory Visit: Payer: Self-pay | Admitting: Family Medicine

## 2016-03-08 ENCOUNTER — Ambulatory Visit
Admission: RE | Admit: 2016-03-08 | Discharge: 2016-03-08 | Disposition: A | Discharge status: Home / Self Care | Payer: Medicare Other | Source: Ambulatory Visit | Attending: Family Medicine | Admitting: Family Medicine

## 2016-03-08 DIAGNOSIS — K769 Liver disease, unspecified: Secondary | ICD-10-CM | POA: Diagnosis not present

## 2016-03-08 DIAGNOSIS — I700 Atherosclerosis of aorta: Secondary | ICD-10-CM | POA: Diagnosis not present

## 2016-03-08 DIAGNOSIS — R109 Unspecified abdominal pain: Secondary | ICD-10-CM

## 2016-03-08 DIAGNOSIS — K8020 Calculus of gallbladder without cholecystitis without obstruction: Secondary | ICD-10-CM | POA: Insufficient documentation

## 2016-03-08 DIAGNOSIS — R1030 Lower abdominal pain, unspecified: Secondary | ICD-10-CM | POA: Diagnosis present

## 2016-03-08 HISTORY — DX: Malignant (primary) neoplasm, unspecified: C80.1

## 2016-03-08 MED ORDER — IOPAMIDOL (ISOVUE-300) INJECTION 61%
100.0000 mL | Freq: Once | INTRAVENOUS | Status: AC | PRN
  Administered 2016-03-08: 100 mL via INTRAVENOUS

## 2016-03-09 ENCOUNTER — Inpatient Hospital Stay: Payer: Medicare Other

## 2016-03-09 VITALS — BP 128/70 | HR 85 | Temp 97.6°F | Resp 18

## 2016-03-09 DIAGNOSIS — C50212 Malignant neoplasm of upper-inner quadrant of left female breast: Secondary | ICD-10-CM

## 2016-03-09 LAB — COMPREHENSIVE METABOLIC PANEL
ALK PHOS: 142 U/L — AB (ref 38–126)
ALT: 23 U/L (ref 14–54)
AST: 24 U/L (ref 15–41)
Albumin: 3.9 g/dL (ref 3.5–5.0)
Anion gap: 7 (ref 5–15)
BILIRUBIN TOTAL: 0.7 mg/dL (ref 0.3–1.2)
BUN: 17 mg/dL (ref 6–20)
CALCIUM: 9 mg/dL (ref 8.9–10.3)
CHLORIDE: 104 mmol/L (ref 101–111)
CO2: 26 mmol/L (ref 22–32)
CREATININE: 0.84 mg/dL (ref 0.44–1.00)
Glucose, Bld: 136 mg/dL — ABNORMAL HIGH (ref 65–99)
Potassium: 3.8 mmol/L (ref 3.5–5.1)
Sodium: 137 mmol/L (ref 135–145)
TOTAL PROTEIN: 7 g/dL (ref 6.5–8.1)

## 2016-03-09 LAB — CBC WITH DIFFERENTIAL/PLATELET
BASOS ABS: 0.1 10*3/uL (ref 0–0.1)
Basophils Relative: 1 %
EOS PCT: 0 %
Eosinophils Absolute: 0 10*3/uL (ref 0–0.7)
HEMATOCRIT: 32.7 % — AB (ref 35.0–47.0)
Hemoglobin: 11.9 g/dL — ABNORMAL LOW (ref 12.0–16.0)
LYMPHS ABS: 1.2 10*3/uL (ref 1.0–3.6)
LYMPHS PCT: 11 %
MCH: 31 pg (ref 26.0–34.0)
MCHC: 36.3 g/dL — ABNORMAL HIGH (ref 32.0–36.0)
MCV: 85.4 fL (ref 80.0–100.0)
Monocytes Absolute: 0.8 10*3/uL (ref 0.2–0.9)
Monocytes Relative: 8 %
NEUTROS ABS: 8.3 10*3/uL — AB (ref 1.4–6.5)
Neutrophils Relative %: 80 %
PLATELETS: 214 10*3/uL (ref 150–440)
RBC: 3.83 MIL/uL (ref 3.80–5.20)
RDW: 12.8 % (ref 11.5–14.5)
WBC: 10.4 10*3/uL (ref 3.6–11.0)

## 2016-03-09 MED ORDER — PEGFILGRASTIM 6 MG/0.6ML ~~LOC~~ PSKT
6.0000 mg | PREFILLED_SYRINGE | Freq: Once | SUBCUTANEOUS | Status: AC
  Administered 2016-03-09: 6 mg via SUBCUTANEOUS
  Filled 2016-03-09: qty 0.6

## 2016-03-09 MED ORDER — DOXORUBICIN HCL CHEMO IV INJECTION 2 MG/ML
60.0000 mg/m2 | Freq: Once | INTRAVENOUS | Status: AC
  Administered 2016-03-09: 106 mg via INTRAVENOUS
  Filled 2016-03-09: qty 53

## 2016-03-09 MED ORDER — PEGFILGRASTIM 6 MG/0.6ML ~~LOC~~ PSKT: 6.0000 mg | PREFILLED_SYRINGE | Freq: Once | SUBCUTANEOUS | Status: DC

## 2016-03-09 MED ORDER — HEPARIN SOD (PORK) LOCK FLUSH 100 UNIT/ML IV SOLN: 500.0000 [IU] | Freq: Once | INTRAVENOUS | Status: AC | PRN

## 2016-03-09 MED ORDER — SODIUM CHLORIDE 0.9 % IV SOLN
Freq: Once | INTRAVENOUS | Status: AC
  Administered 2016-03-09: 10:00:00 via INTRAVENOUS
  Filled 2016-03-09: qty 1000

## 2016-03-09 MED ORDER — CYCLOPHOSPHAMIDE CHEMO INJECTION 1 GM
1000.0000 mg | Freq: Once | INTRAMUSCULAR | Status: AC
  Administered 2016-03-09: 1000 mg via INTRAVENOUS
  Filled 2016-03-09: qty 50

## 2016-03-09 MED ORDER — SODIUM CHLORIDE 0.9 % IJ SOLN
10.0000 mL | Freq: Once | INTRAMUSCULAR | Status: AC
  Administered 2016-03-09: 10 mL via INTRAVENOUS
  Filled 2016-03-09: qty 10

## 2016-03-09 MED ORDER — HEPARIN SOD (PORK) LOCK FLUSH 100 UNIT/ML IV SOLN
500.0000 [IU] | Freq: Once | INTRAVENOUS | Status: AC
  Administered 2016-03-09: 500 [IU] via INTRAVENOUS

## 2016-03-09 MED ORDER — PALONOSETRON HCL INJECTION 0.25 MG/5ML
0.2500 mg | Freq: Once | INTRAVENOUS | Status: AC
  Administered 2016-03-09: 0.25 mg via INTRAVENOUS
  Filled 2016-03-09: qty 5

## 2016-03-09 MED ORDER — SODIUM CHLORIDE 0.9 % IV SOLN
Freq: Once | INTRAVENOUS | Status: AC
  Administered 2016-03-09: 10:00:00 via INTRAVENOUS
  Filled 2016-03-09: qty 5

## 2016-03-21 NOTE — Progress Notes (Signed)
.  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  INTERVAL HISTORY: Patient returns to clinic today for further evaluation and consideration of cycle 3 of adjuvant Adriamycin and Cytoxan. She has some mild mouth sores that have improved. She otherwise feels well and is asymptomatic.  She has no neurologic complaints. She denies any recent fevers or illnesses. She has a good appetite and denies weight loss. She denies any pain. She denies any chest pain or shortness of breath. She denies any nausea, vomiting, constipation, or diarrhea. She has no urinary complaints. Patient offers no specific complaints today.  REVIEW OF SYSTEMS:   Review of Systems  Constitutional: Negative.  Negative for fever, malaise/fatigue and weight loss.  Respiratory: Negative.  Negative for cough and shortness of breath.   Cardiovascular: Negative.  Negative for chest pain.  Gastrointestinal: Negative.  Negative for abdominal pain.  Genitourinary: Negative.   Musculoskeletal: Negative.   Neurological: Negative.  Negative for weakness.  Psychiatric/Behavioral: Negative.  The patient is not nervous/anxious.     As per HPI. Otherwise, a complete review of systems is negatve.  PAST MEDICAL HISTORY: Past Medical History:  Diagnosis Date  . Cancer ([place])   . Generalized OA   . GERD (gastroesophageal reflux disease)   . Hypercholesteremia   . Hypertension   . Hypothyroid   . Migraine   . Osteoporosis   . Stroke ([place])   . TIA (transient ischemic attack)    July    PAST SURGICAL HISTORY: Past Surgical History:  Procedure Laterality Date  . BREAST BIOPSY Left    bx/clip-neg  . BREAST BIOPSY Left  12/15/2015   path pending/us and stereo bx  . CATARACT EXTRACTION    . DILATION AND CURETTAGE OF UTERUS    . EYE SURGERY Right    Cataract Extraction with IOL  . PARTIAL MASTECTOMY WITH AXILLARY SENTINEL LYMPH NODE BIOPSY Left 01/13/2016   Procedure: PARTIAL MASTECTOMY;  Surgeon: Leonie Green, MD;  Location: [facility] ORS;  Service: General;  Laterality: Left;  . PORTACATH PLACEMENT Right 01/13/2016   Procedure: INSERTION PORT-A-CATH;  Surgeon: Leonie Green, MD;  Location: [facility] ORS;  Service: General;  Laterality: Right;  . RETINAL DETACHMENT SURGERY Right    Dr. Starling Manns, [facility]  . TONSILLECTOMY      FAMILY HISTORY Family History  Problem Relation Age of Onset  . Hypertension Mother   . Osteoarthritis Mother   . Dementia Father   . Breast cancer Cousin 42    mat cousin       ADVANCED DIRECTIVES:    HEALTH MAINTENANCE: Social History  Substance Use Topics  . Smoking status: Never Smoker  . Smokeless tobacco: Never Used  . Alcohol use No     Colonoscopy:  PAP:  Bone density:  Lipid panel:  No Known Allergies  Current Outpatient Prescriptions  Medication Sig Dispense Refill  . atorvastatin (LIPITOR) 80 MG tablet Take 1 tablet (80 mg total) by mouth daily at 6 PM. 30 tablet 0  . hydrochlorothiazide (HYDRODIURIL) 12.5 MG tablet Take 12.5 mg by mouth daily.     Marland Kitchen levothyroxine (SYNTHROID, LEVOTHROID) 50 MCG tablet Take 1 tablet by mouth daily.  0  . lidocaine-prilocaine (EMLA) cream Apply to affected  area once 30 g 3  . ondansetron (ZOFRAN) 8 MG tablet Take 1 tablet (8 mg total) by mouth 2 (two) times daily as needed. (Patient not taking: Reported on 03/02/2016) 30 tablet 1  . potassium chloride SA (K-DUR,KLOR-CON) 20 MEQ tablet Take 1 tablet (20 mEq total) by mouth 2 (two) times daily. 60 tablet 3  . prochlorperazine (COMPAZINE) 10 MG tablet Take 1 tablet (10 mg total) by mouth every 6 (six) hours as needed (Nausea or vomiting). (Patient not taking: Reported on  03/02/2016) 30 tablet 1  . Vitamin D, Ergocalciferol, (DRISDOL) 50000 UNITS CAPS capsule Take 2,000 Units by mouth daily.      No current facility-administered medications for this visit.    Facility-Administered Medications Ordered in Other Visits  Medication Dose Route Frequency Provider Last Rate Last Dose  . heparin lock flush 100 unit/mL  500 Units Intracatheter Once PRN Lloyd Huger, MD      . sodium chloride flush (NS) 0.9 % injection 10 mL  10 mL Intracatheter PRN Lloyd Huger, MD   10 mL at 03/23/16 0930    OBJECTIVE: There were no vitals filed for this visit.   There is no height or weight on file to calculate BMI.    ECOG FS:0 - Asymptomatic  General: Well-developed, well-nourished, no acute distress. Eyes: Pink conjunctiva, anicteric sclera. Breasts: Patient requested exam be deferred today. Lungs: Clear to auscultation bilaterally. Heart: Regular rate and rhythm. No rubs, murmurs, or gallops. Abdomen: Soft, nontender, nondistended. No organomegaly noted, normoactive bowel sounds. Musculoskeletal: No edema, cyanosis, or clubbing. Neuro: Alert, answering all questions appropriately. Cranial nerves grossly intact. Skin: No rashes or petechiae noted. Psych: Normal affect.   LAB RESULTS:  Lab Results  Component Value Date   NA 136 03/23/2016   K 2.9 (L) 03/23/2016   CL 100 (L) 03/23/2016   CO2 26 03/23/2016   GLUCOSE 206 (H) 03/23/2016   BUN 17 03/23/2016   CREATININE 0.81 03/23/2016   CALCIUM 9.1 03/23/2016   PROT 7.0 03/23/2016   ALBUMIN 4.0 03/23/2016   AST 20 03/23/2016   ALT 20 03/23/2016   ALKPHOS 137 (H) 03/23/2016   BILITOT 0.8 03/23/2016   GFRNONAA >60 03/23/2016   GFRAA >60 03/23/2016    Lab Results  Component Value Date   WBC 13.2 (H) 03/23/2016   NEUTROABS 11.3 (H) 03/23/2016   HGB 10.9 (L) 03/23/2016   HCT 31.5 (L) 03/23/2016   MCV 86.3 03/23/2016   PLT 129 (L) 03/23/2016     STUDIES: Ct Abdomen Pelvis W Contrast  Result  Date: 03/08/2016 CLINICAL DATA:  Lower abdominal pain and diarrhea. EXAM: CT ABDOMEN AND PELVIS WITH CONTRAST TECHNIQUE: Multidetector CT imaging of the abdomen and pelvis was performed using the standard protocol following bolus administration of intravenous contrast. CONTRAST:  128mL ISOVUE-300 IOPAMIDOL (ISOVUE-300) INJECTION 61% COMPARISON:  None. FINDINGS: Lower chest: Aortic atherosclerosis and coronary artery calcification. Heart size is normal. Left mastectomy. Hepatobiliary: 6 mm lesion in the left lobe of the liver on image 25 of series 2, indeterminate. Numerous noncalcified stones in the gallbladder. No bile duct dilatation. Pancreas: No mass, inflammatory changes, or other significant abnormality. Spleen: Within normal limits in size and appearance. Adrenals/Urinary Tract: No masses identified. No evidence of hydronephrosis. Stomach/Bowel: No evidence of obstruction, inflammatory process, or abnormal fluid collections. Terminal ileum and appendix are normal. Vascular/Lymphatic: Aortic atherosclerosis.  No adenopathy. Reproductive: No mass or other significant abnormality. Other: No free air or free fluid. Tiny umbilical hernia containing  only fat. Musculoskeletal:  Degenerative disc and joint disease at L5-S1. IMPRESSION: Multiple non-calcified stones in the gallbladder. Tiny lesion in the left lobe of the liver is most likely a benign hemangioma. Aortic atherosclerosis. Electronically Signed   By: Lorriane Shire M.D.   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  PLAN:    1. 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 Pretreatment MUGA was 64.5%.  Proceed with cycle 3 of 4 of adjuvant  Adriamycin and Cytoxan today.  She also received OnPro Neulasta support.  Because patient had modified mastectomy, she will not require adjuvant XRT. Although will further discuss with radiation oncology at the conclusion of her chemotherapy given her 2 positive axillary lymph nodes.  She will also require 12 weekly cycles Taxol at the conclusion of her AC.  An aromatase inhibitor would offer no benefit given the triple negative status of her disease. Return to clinic in 2 weeks for further evaluation and consideration of cycle 4. 2.  Hypertension:  Patient's blood pressure is elevated today. Monitor. Continue current medications. 3.  Neutropenia: Secondary to chemotherapy. Patient received Neulasta with her treatment. Monitor. 4.  Thrombocytopenia: Proceed with treatment as above. 5.  Mouth sores: Continue OTC treatments as needed.   Patient expressed understanding and was in agreement with this plan. She also understands that She can call clinic at any time with any questions, concerns, or complaints.   Breast cancer of upper-inner quadrant of left female breast [facility])   Staging form: Breast, AJCC 7th Edition     Pathologic stage from 01/06/2016: Stage IIA (T1c, N1a, M0) - Signed by Lloyd Huger, MD on 01/06/2016   Lloyd Huger, MD   03/23/2016 1:12 PM

## 2016-03-23 ENCOUNTER — Inpatient Hospital Stay: Payer: Medicare Other

## 2016-03-23 ENCOUNTER — Inpatient Hospital Stay (HOSPITAL_BASED_OUTPATIENT_CLINIC_OR_DEPARTMENT_OTHER): Payer: Medicare Other | Admitting: Oncology

## 2016-03-23 VITALS — BP 124/68 | HR 67 | Temp 97.0°F | Resp 18

## 2016-03-23 DIAGNOSIS — Z803 Family history of malignant neoplasm of breast: Secondary | ICD-10-CM

## 2016-03-23 DIAGNOSIS — Z7689 Persons encountering health services in other specified circumstances: Secondary | ICD-10-CM | POA: Diagnosis not present

## 2016-03-23 DIAGNOSIS — K219 Gastro-esophageal reflux disease without esophagitis: Secondary | ICD-10-CM

## 2016-03-23 DIAGNOSIS — Z8673 Personal history of transient ischemic attack (TIA), and cerebral infarction without residual deficits: Secondary | ICD-10-CM

## 2016-03-23 DIAGNOSIS — I10 Essential (primary) hypertension: Secondary | ICD-10-CM

## 2016-03-23 DIAGNOSIS — M1990 Unspecified osteoarthritis, unspecified site: Secondary | ICD-10-CM

## 2016-03-23 DIAGNOSIS — Z8669 Personal history of other diseases of the nervous system and sense organs: Secondary | ICD-10-CM

## 2016-03-23 DIAGNOSIS — D696 Thrombocytopenia, unspecified: Secondary | ICD-10-CM

## 2016-03-23 DIAGNOSIS — C50212 Malignant neoplasm of upper-inner quadrant of left female breast: Secondary | ICD-10-CM

## 2016-03-23 DIAGNOSIS — Z79899 Other long term (current) drug therapy: Secondary | ICD-10-CM

## 2016-03-23 DIAGNOSIS — M818 Other osteoporosis without current pathological fracture: Secondary | ICD-10-CM

## 2016-03-23 DIAGNOSIS — Z171 Estrogen receptor negative status [ER-]: Secondary | ICD-10-CM | POA: Diagnosis not present

## 2016-03-23 DIAGNOSIS — D701 Agranulocytosis secondary to cancer chemotherapy: Secondary | ICD-10-CM

## 2016-03-23 DIAGNOSIS — E039 Hypothyroidism, unspecified: Secondary | ICD-10-CM

## 2016-03-23 DIAGNOSIS — E119 Type 2 diabetes mellitus without complications: Secondary | ICD-10-CM

## 2016-03-23 DIAGNOSIS — E7800 Pure hypercholesterolemia, unspecified: Secondary | ICD-10-CM

## 2016-03-23 DIAGNOSIS — T451X5S Adverse effect of antineoplastic and immunosuppressive drugs, sequela: Secondary | ICD-10-CM

## 2016-03-23 DIAGNOSIS — K1370 Unspecified lesions of oral mucosa: Secondary | ICD-10-CM

## 2016-03-23 DIAGNOSIS — F419 Anxiety disorder, unspecified: Secondary | ICD-10-CM

## 2016-03-23 LAB — CBC WITH DIFFERENTIAL/PLATELET
BASOS ABS: 0.1 10*3/uL (ref 0–0.1)
EOS ABS: 0 10*3/uL (ref 0–0.7)
HCT: 31.5 % — ABNORMAL LOW (ref 35.0–47.0)
Hemoglobin: 10.9 g/dL — ABNORMAL LOW (ref 12.0–16.0)
Lymphocytes Relative: 7 %
Lymphs Abs: 0.9 10*3/uL — ABNORMAL LOW (ref 1.0–3.6)
MCH: 29.9 pg (ref 26.0–34.0)
MCHC: 34.6 g/dL (ref 32.0–36.0)
MCV: 86.3 fL (ref 80.0–100.0)
MONO ABS: 0.8 10*3/uL (ref 0.2–0.9)
Monocytes Relative: 6 %
NEUTROS ABS: 11.3 10*3/uL — AB (ref 1.4–6.5)
Neutrophils Relative %: 86 %
PLATELETS: 129 10*3/uL — AB (ref 150–440)
RBC: 3.66 MIL/uL — ABNORMAL LOW (ref 3.80–5.20)
RDW: 13.3 % (ref 11.5–14.5)
WBC: 13.2 10*3/uL — ABNORMAL HIGH (ref 3.6–11.0)

## 2016-03-23 LAB — COMPREHENSIVE METABOLIC PANEL
ALBUMIN: 4 g/dL (ref 3.5–5.0)
ALT: 20 U/L (ref 14–54)
ANION GAP: 10 (ref 5–15)
AST: 20 U/L (ref 15–41)
Alkaline Phosphatase: 137 U/L — ABNORMAL HIGH (ref 38–126)
BUN: 17 mg/dL (ref 6–20)
CHLORIDE: 100 mmol/L — AB (ref 101–111)
CO2: 26 mmol/L (ref 22–32)
Calcium: 9.1 mg/dL (ref 8.9–10.3)
Creatinine, Ser: 0.81 mg/dL (ref 0.44–1.00)
GFR calc Af Amer: >60 mL/min (ref >60)
GFR calc non Af Amer: >60 mL/min (ref >60)
GLUCOSE: 206 mg/dL — AB (ref 65–99)
POTASSIUM: 2.9 mmol/L — AB (ref 3.5–5.1)
Sodium: 136 mmol/L (ref 135–145)
Total Bilirubin: 0.8 mg/dL (ref 0.3–1.2)
Total Protein: 7 g/dL (ref 6.5–8.1)

## 2016-03-23 MED ORDER — SODIUM CHLORIDE 0.9% FLUSH
10.0000 mL | INTRAVENOUS | Status: DC | PRN
  Administered 2016-03-23: 10 mL
  Filled 2016-03-23: qty 10

## 2016-03-23 MED ORDER — SODIUM CHLORIDE 0.9 % IV SOLN
Freq: Once | INTRAVENOUS | Status: AC
  Administered 2016-03-23: 10:00:00 via INTRAVENOUS
  Filled 2016-03-23: qty 1000

## 2016-03-23 MED ORDER — PALONOSETRON HCL INJECTION 0.25 MG/5ML
0.2500 mg | Freq: Once | INTRAVENOUS | Status: AC
  Administered 2016-03-23: 0.25 mg via INTRAVENOUS
  Filled 2016-03-23: qty 5

## 2016-03-23 MED ORDER — SODIUM CHLORIDE 0.9 % IV SOLN
1000.0000 mg | Freq: Once | INTRAVENOUS | Status: AC
  Administered 2016-03-23: 1000 mg via INTRAVENOUS
  Filled 2016-03-23: qty 50

## 2016-03-23 MED ORDER — DOXORUBICIN HCL CHEMO IV INJECTION 2 MG/ML
60.0000 mg/m2 | Freq: Once | INTRAVENOUS | Status: AC
  Administered 2016-03-23: 106 mg via INTRAVENOUS
  Filled 2016-03-23: qty 53

## 2016-03-23 MED ORDER — FOSAPREPITANT DIMEGLUMINE INJECTION 150 MG
Freq: Once | INTRAVENOUS | Status: AC
  Administered 2016-03-23: 11:00:00 via INTRAVENOUS
  Filled 2016-03-23: qty 5

## 2016-03-23 MED ORDER — POTASSIUM CHLORIDE 20 MEQ/100ML IV SOLN
Freq: Once | INTRAVENOUS | Status: AC
  Administered 2016-03-23: 13:00:00 via INTRAVENOUS
  Filled 2016-03-23: qty 100

## 2016-03-23 MED ORDER — POTASSIUM CHLORIDE CRYS ER 20 MEQ PO TBCR: 20.0000 meq | EXTENDED_RELEASE_TABLET | Freq: Two times a day (BID) | ORAL | 3 refills | Status: DC

## 2016-03-23 MED ORDER — HEPARIN SOD (PORK) LOCK FLUSH 100 UNIT/ML IV SOLN
500.0000 [IU] | Freq: Once | INTRAVENOUS | Status: AC | PRN
  Administered 2016-03-23: 500 [IU]
  Filled 2016-03-23: qty 5

## 2016-03-23 MED ORDER — PEGFILGRASTIM 6 MG/0.6ML ~~LOC~~ PSKT
6.0000 mg | PREFILLED_SYRINGE | Freq: Once | SUBCUTANEOUS | Status: AC
Start: 2016-03-24 — End: 2016-03-23
  Administered 2016-03-23: 6 mg via SUBCUTANEOUS

## 2016-03-23 NOTE — Progress Notes (Signed)
Patient here today for ongoing follow up and treatment consideration regarding breast cancer. Patient seen by Dr. Grayland Ormond in infusion area.

## 2016-04-05 ENCOUNTER — Telehealth: Payer: Self-pay | Admitting: *Deleted

## 2016-04-05 NOTE — Progress Notes (Signed)
.  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  INTERVAL HISTORY: Patient returns to clinic today for further evaluation and consideration of cycle 4 of adjuvant Adriamycin and Cytoxan. She has a mild sore throat, does not complain of dysphagia. She otherwise feels well and is asymptomatic.  She has no neurologic complaints. She denies any recent fevers or illnesses. She has a good appetite and denies weight loss. She denies any pain. She denies any chest pain or shortness of breath. She denies any nausea, vomiting, constipation, or diarrhea. She has no urinary complaints. Patient offers no further specific complaints today.  REVIEW OF SYSTEMS:   Review of Systems  Constitutional: Negative.  Negative for fever, malaise/fatigue and weight loss.  Respiratory: Negative.  Negative for cough and shortness of breath.   Cardiovascular: Negative.  Negative for chest pain.  Gastrointestinal: Negative.  Negative for abdominal pain.  Genitourinary: Negative.   Musculoskeletal: Negative.   Neurological: Negative.  Negative for weakness.  Psychiatric/Behavioral: Negative.  The patient is not nervous/anxious.     As per HPI. Otherwise, a complete review of systems is negatve.  PAST MEDICAL HISTORY: Past Medical History:  Diagnosis Date  . Cancer ([place])   . Generalized OA   . GERD (gastroesophageal reflux disease)   . Hypercholesteremia   . Hypertension   . Hypothyroid   . Migraine   . Osteoporosis   . Stroke ([place])   . TIA (transient ischemic attack)    July    PAST SURGICAL HISTORY: Past Surgical History:  Procedure Laterality Date  . BREAST BIOPSY Left    bx/clip-neg  .  BREAST BIOPSY Left 12/15/2015   path pending/us and stereo bx  . CATARACT EXTRACTION    . DILATION AND CURETTAGE OF UTERUS    . EYE SURGERY Right    Cataract Extraction with IOL  . PARTIAL MASTECTOMY WITH AXILLARY SENTINEL LYMPH NODE BIOPSY Left 01/13/2016   Procedure: PARTIAL MASTECTOMY;  Surgeon: Leonie Green, MD;  Location: [facility] ORS;  Service: General;  Laterality: Left;  . PORTACATH PLACEMENT Right 01/13/2016   Procedure: INSERTION PORT-A-CATH;  Surgeon: Leonie Green, MD;  Location: [facility] ORS;  Service: General;  Laterality: Right;  . RETINAL DETACHMENT SURGERY Right    Dr. Starling Manns, [facility]  . TONSILLECTOMY      FAMILY HISTORY Family History  Problem Relation Age of Onset  . Hypertension Mother   . Osteoarthritis Mother   . Dementia Father   . Breast cancer Cousin 70    mat cousin       ADVANCED DIRECTIVES:    HEALTH MAINTENANCE: Social History  Substance Use Topics  . Smoking status: Never Smoker  . Smokeless tobacco: Never Used  . Alcohol use No     Colonoscopy:  PAP:  Bone density:  Lipid panel:  No Known Allergies  Current Outpatient Prescriptions  Medication Sig Dispense Refill  . atorvastatin (LIPITOR) 80 MG tablet Take 1 tablet (80 mg total) by mouth daily at 6 PM. 30 tablet 0  . hydrochlorothiazide (HYDRODIURIL) 12.5 MG tablet Take 12.5 mg by mouth daily.     Marland Kitchen levothyroxine (SYNTHROID, LEVOTHROID) 50 MCG tablet Take 1 tablet by mouth daily.  0  . lidocaine-prilocaine (EMLA) cream  Apply to affected area once 30 g 3  . ondansetron (ZOFRAN) 8 MG tablet Take 1 tablet (8 mg total) by mouth 2 (two) times daily as needed. 30 tablet 1  . prochlorperazine (COMPAZINE) 10 MG tablet Take 1 tablet (10 mg total) by mouth every 6 (six) hours as needed (Nausea or vomiting). 30 tablet 1  . Vitamin D, Ergocalciferol, (DRISDOL) 50000 UNITS CAPS capsule Take 2,000 Units by mouth daily.     . potassium chloride (KLOR-CON) 20 MEQ packet Take 20 mEq by mouth 2  (two) times daily. 60 packet 3   No current facility-administered medications for this visit.     OBJECTIVE: Vitals:   04/06/16 0907  BP: 122/71  Pulse: (!) 125  Temp: 97 F (36.1 C)     Body mass index is 28.07 kg/m.    ECOG FS:0 - Asymptomatic  General: Well-developed, well-nourished, no acute distress. Eyes: Pink conjunctiva, anicteric sclera. HEENT: No erythema or exudate. No palpable lymphadenopathy. Breasts: Patient requested exam be deferred today. Lungs: Clear to auscultation bilaterally. Heart: Regular rate and rhythm. No rubs, murmurs, or gallops. Abdomen: Soft, nontender, nondistended. No organomegaly noted, normoactive bowel sounds. Musculoskeletal: No edema, cyanosis, or clubbing. Neuro: Alert, answering all questions appropriately. Cranial nerves grossly intact. Skin: No rashes or petechiae noted. Psych: Normal affect.   LAB RESULTS:  Lab Results  Component Value Date   NA 136 04/06/2016   K 2.9 (L) 04/06/2016   CL 99 (L) 04/06/2016   CO2 27 04/06/2016   GLUCOSE 191 (H) 04/06/2016   BUN 16 04/06/2016   CREATININE 0.80 04/06/2016   CALCIUM 8.9 04/06/2016   PROT 6.8 04/06/2016   ALBUMIN 4.0 04/06/2016   AST 18 04/06/2016   ALT 17 04/06/2016   ALKPHOS 133 (H) 04/06/2016   BILITOT 0.6 04/06/2016   GFRNONAA >60 04/06/2016   GFRAA >60 04/06/2016    Lab Results  Component Value Date   WBC 11.3 (H) 04/06/2016   NEUTROABS 9.4 (H) 04/06/2016   HGB 10.6 (L) 04/06/2016   HCT 30.2 (L) 04/06/2016   MCV 86.6 04/06/2016   PLT 164 04/06/2016     STUDIES: Ct Abdomen Pelvis W Contrast  Result Date: 03/08/2016 CLINICAL DATA:  Lower abdominal pain and diarrhea. EXAM: CT ABDOMEN AND PELVIS WITH CONTRAST TECHNIQUE: Multidetector CT imaging of the abdomen and pelvis was performed using the standard protocol following bolus administration of intravenous contrast. CONTRAST:  162mL ISOVUE-300 IOPAMIDOL (ISOVUE-300) INJECTION 61% COMPARISON:  None. FINDINGS: Lower  chest: Aortic atherosclerosis and coronary artery calcification. Heart size is normal. Left mastectomy. Hepatobiliary: 6 mm lesion in the left lobe of the liver on image 25 of series 2, indeterminate. Numerous noncalcified stones in the gallbladder. No bile duct dilatation. Pancreas: No mass, inflammatory changes, or other significant abnormality. Spleen: Within normal limits in size and appearance. Adrenals/Urinary Tract: No masses identified. No evidence of hydronephrosis. Stomach/Bowel: No evidence of obstruction, inflammatory process, or abnormal fluid collections. Terminal ileum and appendix are normal. Vascular/Lymphatic: Aortic atherosclerosis.  No adenopathy. Reproductive: No mass or other significant abnormality. Other: No free air or free fluid. Tiny umbilical hernia containing only fat. Musculoskeletal:  Degenerative disc and joint disease at L5-S1. IMPRESSION: Multiple non-calcified stones in the gallbladder. Tiny lesion in the left lobe of the liver is most likely a benign hemangioma. Aortic atherosclerosis. Electronically Signed   By: Lorriane Shire M.D.   On: 03/08/2016 16:41    ASSESSMENT: Stage IIa upper inner quadrant of the left breast triple negative adenocarcinoma  with overlapping left breast DCIS.  PLAN:    1. 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 Pretreatment MUGA was 64.5%.  Proceed with cycle 4 of 4 of adjuvant Adriamycin and Cytoxan today.  She also received OnPro Neulasta support.  Because patient had modified mastectomy, she will not require adjuvant XRT. Although will further discuss with radiation oncology at the conclusion of her chemotherapy given her 2 positive axillary lymph nodes.  She will also require 12 weekly cycles Taxol at the conclusion of her AC.  An  aromatase inhibitor would offer no benefit given the triple negative status of her disease. Return to clinic in 2 weeks for further evaluation and consideration of cycle 1 of 12 of weekly Taxol. 2.  Hypertension:  Patient's blood pressure is within normal limits today. Monitor. Continue current medications. 3.  Neutropenia: Resolved. 4.  Thrombocytopenia: Resolved. 5.  Mouth sores: Continue OTC treatments as needed.  6.  Sore throat: No obvious exudate or erythema. Monitor. 7.  Hypokalemia: Patient states she cannot tolerate oral pills and therefore was given a prescription for potassium chloride powder today. She also received 40 mEq IV potassium.  Patient expressed understanding and was in agreement with this plan. She also understands that She can call clinic at any time with any questions, concerns, or complaints.   Breast cancer of upper-inner quadrant of left female breast [facility])   Staging form: Breast, AJCC 7th Edition     Pathologic stage from 01/06/2016: Stage IIA (T1c, N1a, M0) - Signed by Lloyd Huger, MD on 01/06/2016   Lloyd Huger, MD   04/07/2016 9:02 AM

## 2016-04-05 NOTE — Telephone Encounter (Signed)
Complaining of sore throat denies fever, has chemo appt tomorrow. Per Dr Grayland Ormond if she develops fever go to Urgent care otherwise use OTC remedies and he will see her in the morning. She is agreeable to this plan and states she just checked her temp and it is 98.1

## 2016-04-06 ENCOUNTER — Inpatient Hospital Stay (HOSPITAL_BASED_OUTPATIENT_CLINIC_OR_DEPARTMENT_OTHER): Payer: Medicare Other | Admitting: Oncology

## 2016-04-06 ENCOUNTER — Inpatient Hospital Stay: Payer: Medicare Other

## 2016-04-06 ENCOUNTER — Inpatient Hospital Stay: Payer: Medicare Other | Attending: Oncology

## 2016-04-06 VITALS — BP 122/71 | HR 125 | Temp 97.0°F | Ht 61.5 in | Wt 151.0 lb

## 2016-04-06 DIAGNOSIS — Z79899 Other long term (current) drug therapy: Secondary | ICD-10-CM | POA: Diagnosis not present

## 2016-04-06 DIAGNOSIS — E7800 Pure hypercholesterolemia, unspecified: Secondary | ICD-10-CM

## 2016-04-06 DIAGNOSIS — E876 Hypokalemia: Secondary | ICD-10-CM | POA: Insufficient documentation

## 2016-04-06 DIAGNOSIS — K219 Gastro-esophageal reflux disease without esophagitis: Secondary | ICD-10-CM | POA: Diagnosis not present

## 2016-04-06 DIAGNOSIS — K1370 Unspecified lesions of oral mucosa: Secondary | ICD-10-CM | POA: Insufficient documentation

## 2016-04-06 DIAGNOSIS — T451X5S Adverse effect of antineoplastic and immunosuppressive drugs, sequela: Secondary | ICD-10-CM | POA: Insufficient documentation

## 2016-04-06 DIAGNOSIS — Z8669 Personal history of other diseases of the nervous system and sense organs: Secondary | ICD-10-CM | POA: Diagnosis not present

## 2016-04-06 DIAGNOSIS — I10 Essential (primary) hypertension: Secondary | ICD-10-CM | POA: Insufficient documentation

## 2016-04-06 DIAGNOSIS — J029 Acute pharyngitis, unspecified: Secondary | ICD-10-CM | POA: Insufficient documentation

## 2016-04-06 DIAGNOSIS — E039 Hypothyroidism, unspecified: Secondary | ICD-10-CM | POA: Insufficient documentation

## 2016-04-06 DIAGNOSIS — Z8673 Personal history of transient ischemic attack (TIA), and cerebral infarction without residual deficits: Secondary | ICD-10-CM | POA: Diagnosis not present

## 2016-04-06 DIAGNOSIS — Z803 Family history of malignant neoplasm of breast: Secondary | ICD-10-CM | POA: Insufficient documentation

## 2016-04-06 DIAGNOSIS — C50212 Malignant neoplasm of upper-inner quadrant of left female breast: Secondary | ICD-10-CM | POA: Insufficient documentation

## 2016-04-06 DIAGNOSIS — Z5111 Encounter for antineoplastic chemotherapy: Secondary | ICD-10-CM | POA: Insufficient documentation

## 2016-04-06 DIAGNOSIS — Z171 Estrogen receptor negative status [ER-]: Secondary | ICD-10-CM

## 2016-04-06 DIAGNOSIS — M818 Other osteoporosis without current pathological fracture: Secondary | ICD-10-CM | POA: Insufficient documentation

## 2016-04-06 DIAGNOSIS — R5383 Other fatigue: Secondary | ICD-10-CM | POA: Insufficient documentation

## 2016-04-06 DIAGNOSIS — R531 Weakness: Secondary | ICD-10-CM | POA: Insufficient documentation

## 2016-04-06 DIAGNOSIS — M1990 Unspecified osteoarthritis, unspecified site: Secondary | ICD-10-CM

## 2016-04-06 DIAGNOSIS — Z7689 Persons encountering health services in other specified circumstances: Secondary | ICD-10-CM

## 2016-04-06 DIAGNOSIS — D6481 Anemia due to antineoplastic chemotherapy: Secondary | ICD-10-CM | POA: Insufficient documentation

## 2016-04-06 LAB — COMPREHENSIVE METABOLIC PANEL
ALBUMIN: 4 g/dL (ref 3.5–5.0)
ALK PHOS: 133 U/L — AB (ref 38–126)
ALT: 17 U/L (ref 14–54)
ANION GAP: 10 (ref 5–15)
AST: 18 U/L (ref 15–41)
BILIRUBIN TOTAL: 0.6 mg/dL (ref 0.3–1.2)
BUN: 16 mg/dL (ref 6–20)
CALCIUM: 8.9 mg/dL (ref 8.9–10.3)
CO2: 27 mmol/L (ref 22–32)
CREATININE: 0.8 mg/dL (ref 0.44–1.00)
Chloride: 99 mmol/L — ABNORMAL LOW (ref 101–111)
GFR calc Af Amer: >60 mL/min (ref >60)
GFR calc non Af Amer: >60 mL/min (ref >60)
GLUCOSE: 191 mg/dL — AB (ref 65–99)
Potassium: 2.9 mmol/L — ABNORMAL LOW (ref 3.5–5.1)
Sodium: 136 mmol/L (ref 135–145)
TOTAL PROTEIN: 6.8 g/dL (ref 6.5–8.1)

## 2016-04-06 LAB — CBC WITH DIFFERENTIAL/PLATELET
Basophils Absolute: 0.1 10*3/uL (ref 0–0.1)
Eosinophils Absolute: 0 10*3/uL (ref 0–0.7)
Eosinophils Relative: 0 %
HEMATOCRIT: 30.2 % — AB (ref 35.0–47.0)
HEMOGLOBIN: 10.6 g/dL — AB (ref 12.0–16.0)
LYMPHS ABS: 0.9 10*3/uL — AB (ref 1.0–3.6)
Lymphocytes Relative: 8 %
MCH: 30.3 pg (ref 26.0–34.0)
MCHC: 35 g/dL (ref 32.0–36.0)
MCV: 86.6 fL (ref 80.0–100.0)
Monocytes Absolute: 0.9 10*3/uL (ref 0.2–0.9)
NEUTROS ABS: 9.4 10*3/uL — AB (ref 1.4–6.5)
Platelets: 164 10*3/uL (ref 150–440)
RBC: 3.49 MIL/uL — AB (ref 3.80–5.20)
RDW: 14.5 % (ref 11.5–14.5)
WBC: 11.3 10*3/uL — ABNORMAL HIGH (ref 3.6–11.0)

## 2016-04-06 MED ORDER — SODIUM CHLORIDE 0.9% FLUSH
10.0000 mL | INTRAVENOUS | Status: DC | PRN
  Filled 2016-04-06: qty 10

## 2016-04-06 MED ORDER — SODIUM CHLORIDE 0.9 % IV SOLN
Freq: Once | INTRAVENOUS | Status: AC
  Administered 2016-04-06: 12:00:00 via INTRAVENOUS
  Filled 2016-04-06: qty 250

## 2016-04-06 MED ORDER — SODIUM CHLORIDE 0.9% FLUSH
10.0000 mL | INTRAVENOUS | Status: DC | PRN
  Administered 2016-04-06: 10 mL via INTRAVENOUS
  Filled 2016-04-06: qty 10

## 2016-04-06 MED ORDER — SODIUM CHLORIDE 0.9 % IV SOLN
Freq: Once | INTRAVENOUS | Status: AC
  Administered 2016-04-06: 10:00:00 via INTRAVENOUS
  Filled 2016-04-06: qty 1000

## 2016-04-06 MED ORDER — PALONOSETRON HCL INJECTION 0.25 MG/5ML
0.2500 mg | Freq: Once | INTRAVENOUS | Status: AC
  Administered 2016-04-06: 0.25 mg via INTRAVENOUS
  Filled 2016-04-06: qty 5

## 2016-04-06 MED ORDER — PEGFILGRASTIM 6 MG/0.6ML ~~LOC~~ PSKT
6.0000 mg | PREFILLED_SYRINGE | Freq: Once | SUBCUTANEOUS | Status: AC
  Administered 2016-04-06: 6 mg via SUBCUTANEOUS

## 2016-04-06 MED ORDER — POTASSIUM CHLORIDE 20 MEQ PO PACK: 20.0000 meq | PACK | Freq: Two times a day (BID) | ORAL | 3 refills | Status: DC

## 2016-04-06 MED ORDER — SODIUM CHLORIDE 0.9 % IV SOLN
1000.0000 mg | Freq: Once | INTRAVENOUS | Status: AC
  Administered 2016-04-06: 1000 mg via INTRAVENOUS
  Filled 2016-04-06: qty 50

## 2016-04-06 MED ORDER — HEPARIN SOD (PORK) LOCK FLUSH 100 UNIT/ML IV SOLN
500.0000 [IU] | Freq: Once | INTRAVENOUS | Status: AC
  Administered 2016-04-06: 500 [IU] via INTRAVENOUS

## 2016-04-06 MED ORDER — SODIUM CHLORIDE 0.9 % IV SOLN
Freq: Once | INTRAVENOUS | Status: AC
  Administered 2016-04-06: 10:00:00 via INTRAVENOUS
  Filled 2016-04-06: qty 5

## 2016-04-06 MED ORDER — HEPARIN SOD (PORK) LOCK FLUSH 100 UNIT/ML IV SOLN
500.0000 [IU] | Freq: Once | INTRAVENOUS | Status: DC | PRN
  Filled 2016-04-06: qty 5

## 2016-04-06 MED ORDER — DOXORUBICIN HCL CHEMO IV INJECTION 2 MG/ML
60.0000 mg/m2 | Freq: Once | INTRAVENOUS | Status: AC
  Administered 2016-04-06: 106 mg via INTRAVENOUS
  Filled 2016-04-06: qty 53

## 2016-04-06 NOTE — Progress Notes (Signed)
Patient here for pre treatment check. States she has a "sore throat".

## 2016-04-07 NOTE — Progress Notes (Signed)
START ON PATHWAY REGIMEN - Breast  [id number]: AC-T - [Doxorubicin + Cyclophosphamide q21 Days x 4 Cycles, Followed by Paclitaxel Weekly x 12 Weeks]  Doxorubicin + Cyclophosphamide (AC):   A cycle is every 21 days:     Doxorubicin (Adriamycin(R)) 60 mg/m2 IV Push followed by Dose Mod: None     Cyclophosphamide (Cytoxan(R)) 600 mg/m2 in 250 mL NS IV over 30 minutes Dose Mod: None  **Always confirm dose/schedule in your pharmacy ordering system**    Paclitaxel 80 mg/m2 Weekly:   Administer weekly:     Paclitaxel (Taxol(R)) 80 mg/m2 in 250 mL NS IV over 1 hour Dose Mod: None  **Always confirm dose/schedule in your pharmacy ordering system**    Clinician Notes: AC completed.  proceed with weekly taxol x12.  Patient Characteristics: Adjuvant Therapy, Node Positive (1-3), HER2/neu Negative/Unknown/Equivocal, ER Negative AJCC Stage Grouping: IIA Current Disease Status: No Distant Mets or Local Recurrence AJCC M Stage: X ER Status: Negative (-) AJCC N Stage: X AJCC T Stage: X HER2/neu: Negative (-) PR Status: Negative (-) Node Status: Positive (+) (1-3 Nodes)  Intent of Therapy: Curative Intent, Discussed with Patient

## 2016-04-10 ENCOUNTER — Telehealth: Payer: Self-pay | Admitting: *Deleted

## 2016-04-10 NOTE — Telephone Encounter (Signed)
Needs PA for Potassium powder

## 2016-04-10 NOTE — Telephone Encounter (Signed)
PA submitted to [facility]. Awaiting response.

## 2016-04-19 NOTE — Progress Notes (Signed)
.  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  INTERVAL HISTORY: Patient returns to clinic today for further evaluation and consideration of cycle 1 of 12 of weekly Taxol. She completed 4 cycles of adjuvant Adriamycin and Cytoxan. She currently feels well and is asymptomatic. She has no neurologic complaints. She denies any recent fevers or illnesses. She has a good appetite and denies weight loss. She denies any pain. She denies any chest pain or shortness of breath. She denies any nausea, vomiting, constipation, or diarrhea. She has no urinary complaints. Patient offers no specific complaints today.  REVIEW OF SYSTEMS:   Review of Systems  Constitutional: Negative.  Negative for fever, malaise/fatigue and weight loss.  Respiratory: Negative.  Negative for cough and shortness of breath.   Cardiovascular: Negative.  Negative for chest pain.  Gastrointestinal: Negative.  Negative for abdominal pain.  Genitourinary: Negative.   Musculoskeletal: Negative.   Neurological: Negative.  Negative for weakness.  Psychiatric/Behavioral: Negative.  The patient is not nervous/anxious.     As per HPI. Otherwise, a complete review of systems is negative.  PAST MEDICAL HISTORY: Past Medical History:  Diagnosis Date  . Cancer ([place])   . Generalized OA   . GERD (gastroesophageal reflux disease)   . Hypercholesteremia   . Hypertension   . Hypothyroid   . Migraine   . Osteoporosis   . Stroke ([place])   . TIA (transient ischemic attack)    July    PAST SURGICAL HISTORY: Past Surgical History:  Procedure Laterality Date  . BREAST BIOPSY Left    bx/clip-neg  . BREAST BIOPSY Left  12/15/2015   path pending/us and stereo bx  . CATARACT EXTRACTION    . DILATION AND CURETTAGE OF UTERUS    . EYE SURGERY Right    Cataract Extraction with IOL  . PARTIAL MASTECTOMY WITH AXILLARY SENTINEL LYMPH NODE BIOPSY Left 01/13/2016   Procedure: PARTIAL MASTECTOMY;  Surgeon: Leonie Green, MD;  Location: [facility] ORS;  Service: General;  Laterality: Left;  . PORTACATH PLACEMENT Right 01/13/2016   Procedure: INSERTION PORT-A-CATH;  Surgeon: Leonie Green, MD;  Location: [facility] ORS;  Service: General;  Laterality: Right;  . RETINAL DETACHMENT SURGERY Right    Dr. Starling Manns, [facility]  . TONSILLECTOMY      FAMILY HISTORY Family History  Problem Relation Age of Onset  . Hypertension Mother   . Osteoarthritis Mother   . Dementia Father   . Breast cancer Cousin 2    mat cousin       ADVANCED DIRECTIVES:    HEALTH MAINTENANCE: Social History  Substance Use Topics  . Smoking status: Never Smoker  . Smokeless tobacco: Never Used  . Alcohol use No     Colonoscopy:  PAP:  Bone density:  Lipid panel:  No Known Allergies  Current Outpatient Prescriptions  Medication Sig Dispense Refill  . atorvastatin (LIPITOR) 80 MG tablet Take 1 tablet (80 mg total) by mouth daily at 6 PM. 30 tablet 0  . hydrochlorothiazide (HYDRODIURIL) 12.5 MG tablet Take 12.5 mg by mouth daily.     Marland Kitchen levothyroxine (SYNTHROID, LEVOTHROID) 50 MCG tablet Take 1 tablet by mouth daily.  0  . potassium chloride (KLOR-CON) 20 MEQ packet Take  20 mEq by mouth 2 (two) times daily. 60 packet 3  . Vitamin D, Ergocalciferol, (DRISDOL) 50000 UNITS CAPS capsule Take 2,000 Units by mouth daily.      No current facility-administered medications for this visit.     OBJECTIVE: Vitals:   04/20/16 1020  BP: 120/71  Pulse: (!) 108  Resp: 18  Temp: 98.9 F (37.2 C)     Body mass index is 27.31 kg/m.    ECOG FS:0 - Asymptomatic  General: Well-developed, well-nourished, no acute distress. Eyes: Pink  conjunctiva, anicteric sclera. Breasts: Patient requested exam be deferred today. Lungs: Clear to auscultation bilaterally. Heart: Regular rate and rhythm. No rubs, murmurs, or gallops. Abdomen: Soft, nontender, nondistended. No organomegaly noted, normoactive bowel sounds. Musculoskeletal: No edema, cyanosis, or clubbing. Neuro: Alert, answering all questions appropriately. Cranial nerves grossly intact. Skin: No rashes or petechiae noted. Psych: Normal affect.   LAB RESULTS:  Lab Results  Component Value Date   NA 138 04/20/2016   K 3.4 (L) 04/20/2016   CL 103 04/20/2016   CO2 23 04/20/2016   GLUCOSE 188 (H) 04/20/2016   BUN 14 04/20/2016   CREATININE 0.82 04/20/2016   CALCIUM 8.9 04/20/2016   PROT 6.4 (L) 04/20/2016   ALBUMIN 3.9 04/20/2016   AST 21 04/20/2016   ALT 15 04/20/2016   ALKPHOS 119 04/20/2016   BILITOT 0.9 04/20/2016   GFRNONAA >60 04/20/2016   GFRAA >60 04/20/2016    Lab Results  Component Value Date   WBC 7.6 04/20/2016   NEUTROABS 6.6 (H) 04/20/2016   HGB 8.9 (L) 04/20/2016   HCT 25.5 (L) 04/20/2016   MCV 89.1 04/20/2016   PLT 154 04/20/2016     STUDIES: No results found.  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  PLAN:    1. 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 Pretreatment MUGA was 64.5%.  Patient completed 4 cycles of adjuvant Adriamycin and Cytoxan. Because patient had modified mastectomy, she will not require adjuvant XRT. Although will further discuss with radiation oncology at the conclusion of her chemotherapy given her 2 positive axillary lymph nodes. An aromatase inhibitor would offer no benefit given the triple negative status of her disease.  Proceed with cycle 1 of 12 of weekly Taxol today. Return to clinic in 1 week for consideration of cycle 2. 2.  Hypertension:  Patient's blood pressure is within normal limits today. Monitor. Continue current medications. 3.  Anemia: Secondary chemotherapy, monitor. 4. Hypokalemia: Improved. Patient did not take her potassium chloride powder secondary to cost. Monitor.   Patient expressed understanding and was in agreement with this plan. She also understands that She can call clinic at any time with any questions, concerns, or complaints.   Breast cancer of upper-inner quadrant of left female breast [facility])   Staging form: Breast, AJCC 7th Edition     Pathologic stage from 01/06/2016: Stage IIA (T1c, N1a, M0) - Signed by Lloyd Huger, MD on 01/06/2016   Lloyd Huger, MD   04/24/2016 11:03 AM

## 2016-04-20 ENCOUNTER — Inpatient Hospital Stay: Payer: Medicare Other

## 2016-04-20 ENCOUNTER — Inpatient Hospital Stay (HOSPITAL_BASED_OUTPATIENT_CLINIC_OR_DEPARTMENT_OTHER): Payer: Medicare Other | Admitting: Oncology

## 2016-04-20 VITALS — BP 120/71 | HR 108 | Temp 98.9°F | Resp 18 | Wt 146.9 lb

## 2016-04-20 VITALS — BP 115/68 | HR 81 | Resp 18

## 2016-04-20 DIAGNOSIS — E039 Hypothyroidism, unspecified: Secondary | ICD-10-CM

## 2016-04-20 DIAGNOSIS — K219 Gastro-esophageal reflux disease without esophagitis: Secondary | ICD-10-CM

## 2016-04-20 DIAGNOSIS — Z7689 Persons encountering health services in other specified circumstances: Secondary | ICD-10-CM | POA: Diagnosis not present

## 2016-04-20 DIAGNOSIS — Z8673 Personal history of transient ischemic attack (TIA), and cerebral infarction without residual deficits: Secondary | ICD-10-CM

## 2016-04-20 DIAGNOSIS — C50212 Malignant neoplasm of upper-inner quadrant of left female breast: Secondary | ICD-10-CM

## 2016-04-20 DIAGNOSIS — Z171 Estrogen receptor negative status [ER-]: Secondary | ICD-10-CM | POA: Diagnosis not present

## 2016-04-20 DIAGNOSIS — Z803 Family history of malignant neoplasm of breast: Secondary | ICD-10-CM

## 2016-04-20 DIAGNOSIS — Z8669 Personal history of other diseases of the nervous system and sense organs: Secondary | ICD-10-CM

## 2016-04-20 DIAGNOSIS — E7800 Pure hypercholesterolemia, unspecified: Secondary | ICD-10-CM

## 2016-04-20 DIAGNOSIS — I10 Essential (primary) hypertension: Secondary | ICD-10-CM

## 2016-04-20 DIAGNOSIS — Z79899 Other long term (current) drug therapy: Secondary | ICD-10-CM

## 2016-04-20 DIAGNOSIS — D6481 Anemia due to antineoplastic chemotherapy: Secondary | ICD-10-CM

## 2016-04-20 DIAGNOSIS — E876 Hypokalemia: Secondary | ICD-10-CM

## 2016-04-20 DIAGNOSIS — M818 Other osteoporosis without current pathological fracture: Secondary | ICD-10-CM

## 2016-04-20 DIAGNOSIS — M1990 Unspecified osteoarthritis, unspecified site: Secondary | ICD-10-CM

## 2016-04-20 DIAGNOSIS — T451X5S Adverse effect of antineoplastic and immunosuppressive drugs, sequela: Secondary | ICD-10-CM

## 2016-04-20 LAB — CBC WITH DIFFERENTIAL/PLATELET
BASOS PCT: 0 %
Basophils Absolute: 0 10*3/uL (ref 0–0.1)
EOS ABS: 0 10*3/uL (ref 0–0.7)
Eosinophils Relative: 0 %
HEMATOCRIT: 25.5 % — AB (ref 35.0–47.0)
HEMOGLOBIN: 8.9 g/dL — AB (ref 12.0–16.0)
LYMPHS ABS: 0.4 10*3/uL — AB (ref 1.0–3.6)
Lymphocytes Relative: 6 %
MCH: 31 pg (ref 26.0–34.0)
MCHC: 34.8 g/dL (ref 32.0–36.0)
MCV: 89.1 fL (ref 80.0–100.0)
MONO ABS: 0.6 10*3/uL (ref 0.2–0.9)
MONOS PCT: 8 %
NEUTROS ABS: 6.6 10*3/uL — AB (ref 1.4–6.5)
NEUTROS PCT: 86 %
Platelets: 154 10*3/uL (ref 150–440)
RBC: 2.86 MIL/uL — ABNORMAL LOW (ref 3.80–5.20)
RDW: 18.9 % — AB (ref 11.5–14.5)
WBC: 7.6 10*3/uL (ref 3.6–11.0)

## 2016-04-20 LAB — COMPREHENSIVE METABOLIC PANEL
ALK PHOS: 119 U/L (ref 38–126)
ALT: 15 U/L (ref 14–54)
ANION GAP: 12 (ref 5–15)
AST: 21 U/L (ref 15–41)
Albumin: 3.9 g/dL (ref 3.5–5.0)
BILIRUBIN TOTAL: 0.9 mg/dL (ref 0.3–1.2)
BUN: 14 mg/dL (ref 6–20)
CALCIUM: 8.9 mg/dL (ref 8.9–10.3)
CO2: 23 mmol/L (ref 22–32)
Chloride: 103 mmol/L (ref 101–111)
Creatinine, Ser: 0.82 mg/dL (ref 0.44–1.00)
GFR calc non Af Amer: >60 mL/min (ref >60)
Glucose, Bld: 188 mg/dL — ABNORMAL HIGH (ref 65–99)
POTASSIUM: 3.4 mmol/L — AB (ref 3.5–5.1)
Sodium: 138 mmol/L (ref 135–145)
TOTAL PROTEIN: 6.4 g/dL — AB (ref 6.5–8.1)

## 2016-04-20 MED ORDER — PACLITAXEL CHEMO INJECTION 300 MG/50ML
80.0000 mg/m2 | Freq: Once | INTRAVENOUS | Status: AC
  Administered 2016-04-20: 138 mg via INTRAVENOUS
  Filled 2016-04-20: qty 23

## 2016-04-20 MED ORDER — FAMOTIDINE IN NACL 20-0.9 MG/50ML-% IV SOLN
20.0000 mg | Freq: Once | INTRAVENOUS | Status: AC
  Administered 2016-04-20: 20 mg via INTRAVENOUS
  Filled 2016-04-20: qty 50

## 2016-04-20 MED ORDER — SODIUM CHLORIDE 0.9 % IV SOLN
10.0000 mg | Freq: Once | INTRAVENOUS | Status: AC
  Administered 2016-04-20: 10 mg via INTRAVENOUS
  Filled 2016-04-20: qty 1

## 2016-04-20 MED ORDER — SODIUM CHLORIDE 0.9 % IV SOLN
Freq: Once | INTRAVENOUS | Status: AC
  Administered 2016-04-20: 11:00:00 via INTRAVENOUS
  Filled 2016-04-20: qty 1000

## 2016-04-20 MED ORDER — SODIUM CHLORIDE 0.9% FLUSH
10.0000 mL | INTRAVENOUS | Status: DC | PRN
  Administered 2016-04-20: 10 mL via INTRAVENOUS
  Filled 2016-04-20: qty 10

## 2016-04-20 MED ORDER — DIPHENHYDRAMINE HCL 50 MG/ML IJ SOLN
25.0000 mg | Freq: Once | INTRAMUSCULAR | Status: AC
  Administered 2016-04-20: 25 mg via INTRAVENOUS
  Filled 2016-04-20: qty 1

## 2016-04-20 MED ORDER — HEPARIN SOD (PORK) LOCK FLUSH 100 UNIT/ML IV SOLN
500.0000 [IU] | Freq: Once | INTRAVENOUS | Status: AC
  Administered 2016-04-20: 500 [IU] via INTRAVENOUS

## 2016-04-20 NOTE — Progress Notes (Signed)
States is feeling well. Offers no complaints. 

## 2016-04-25 NOTE — Progress Notes (Signed)
.  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  INTERVAL HISTORY: Patient returns to clinic today for further evaluation and consideration of cycle 2 of 12 of weekly Taxol. She completed 4 cycles of adjuvant Adriamycin and Cytoxan. She tolerated her first infusion well without significant side effects. She continues to have persistent weakness and fatigue, but otherwise feels well. She denies any peripheral neuropathy or other neurologic complaints. She denies any recent fevers or illnesses. She has a good appetite and denies weight loss. She denies any pain. She denies any chest pain or shortness of breath. She denies any nausea, vomiting, constipation, or diarrhea. She has no urinary complaints. Patient offers no further specific complaints today.  REVIEW OF SYSTEMS:   Review of Systems  Constitutional: Positive for malaise/fatigue. Negative for fever and weight loss.  Respiratory: Negative.  Negative for cough and shortness of breath.   Cardiovascular: Negative.  Negative for chest pain.  Gastrointestinal: Negative.  Negative for abdominal pain.  Genitourinary: Negative.   Musculoskeletal: Negative.   Neurological: Positive for weakness.  Psychiatric/Behavioral: Negative.  The patient is not nervous/anxious.     As per HPI. Otherwise, a complete review of systems is negative.  PAST MEDICAL HISTORY: Past Medical History:  Diagnosis Date  . Cancer ([place])   . Generalized OA   . GERD (gastroesophageal reflux disease)   . Hypercholesteremia   . Hypertension   . Hypothyroid   . Migraine   . Osteoporosis   . Stroke ([place])   . TIA (transient ischemic attack)    July     PAST SURGICAL HISTORY: Past Surgical History:  Procedure Laterality Date  . BREAST BIOPSY Left    bx/clip-neg  . BREAST BIOPSY Left 12/15/2015   path pending/us and stereo bx  . CATARACT EXTRACTION    . DILATION AND CURETTAGE OF UTERUS    . EYE SURGERY Right    Cataract Extraction with IOL  . PARTIAL MASTECTOMY WITH AXILLARY SENTINEL LYMPH NODE BIOPSY Left 01/13/2016   Procedure: PARTIAL MASTECTOMY;  Surgeon: Leonie Green, MD;  Location: [facility] ORS;  Service: General;  Laterality: Left;  . PORTACATH PLACEMENT Right 01/13/2016   Procedure: INSERTION PORT-A-CATH;  Surgeon: Leonie Green, MD;  Location: [facility] ORS;  Service: General;  Laterality: Right;  . RETINAL DETACHMENT SURGERY Right    Dr. Starling Manns, [facility]  . TONSILLECTOMY      FAMILY HISTORY Family History  Problem Relation Age of Onset  . Hypertension Mother   . Osteoarthritis Mother   . Dementia Father   . Breast cancer Cousin 2    mat cousin       ADVANCED DIRECTIVES:    HEALTH MAINTENANCE: Social History  Substance Use Topics  . Smoking status: Never Smoker  . Smokeless tobacco: Never Used  . Alcohol use No     Colonoscopy:  PAP:  Bone density:  Lipid panel:  No Known Allergies  Current Outpatient Prescriptions  Medication Sig Dispense Refill  . atorvastatin (LIPITOR) 80 MG tablet Take 1 tablet (80 mg total) by mouth daily at 6 PM. 30 tablet 0  . hydrochlorothiazide (HYDRODIURIL) 12.5 MG tablet Take 12.5 mg by mouth daily.     Marland Kitchen levothyroxine (SYNTHROID, LEVOTHROID) 50 MCG  tablet Take 1 tablet by mouth daily.  0  . potassium chloride (KLOR-CON) 20 MEQ packet Take 20 mEq by mouth 2 (two) times daily. 60 packet 3  . Vitamin D, Ergocalciferol, (DRISDOL) 50000 UNITS CAPS capsule Take 2,000 Units by mouth daily.      No current facility-administered medications for this visit.     OBJECTIVE: Vitals:   04/27/16 1035  BP: 124/72  Pulse: (!) 106  Temp: 97.4 F (36.3 C)     Body mass  index is 27.86 kg/m.    ECOG FS:0 - Asymptomatic  General: Well-developed, well-nourished, no acute distress. Eyes: Pink conjunctiva, anicteric sclera. Breasts: Patient requested exam be deferred today. Lungs: Clear to auscultation bilaterally. Heart: Regular rate and rhythm. No rubs, murmurs, or gallops. Abdomen: Soft, nontender, nondistended. No organomegaly noted, normoactive bowel sounds. Musculoskeletal: No edema, cyanosis, or clubbing. Neuro: Alert, answering all questions appropriately. Cranial nerves grossly intact. Skin: No rashes or petechiae noted. Psych: Normal affect.   LAB RESULTS:  Lab Results  Component Value Date   NA 137 04/27/2016   K 3.3 (L) 04/27/2016   CL 105 04/27/2016   CO2 22 04/27/2016   GLUCOSE 157 (H) 04/27/2016   BUN 12 04/27/2016   CREATININE 0.70 04/27/2016   CALCIUM 8.7 (L) 04/27/2016   PROT 6.3 (L) 04/27/2016   ALBUMIN 3.8 04/27/2016   AST 28 04/27/2016   ALT 18 04/27/2016   ALKPHOS 95 04/27/2016   BILITOT 0.8 04/27/2016   GFRNONAA >60 04/27/2016   GFRAA >60 04/27/2016    Lab Results  Component Value Date   WBC 4.3 04/27/2016   NEUTROABS 3.4 04/27/2016   HGB 8.2 (L) 04/27/2016   HCT 23.6 (L) 04/27/2016   MCV 90.0 04/27/2016   PLT 276 04/27/2016     STUDIES: No results found.  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  PLAN:    1. 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 Pretreatment MUGA was 64.5%.  Patient completed 4 cycles of adjuvant Adriamycin and Cytoxan. Because patient had modified mastectomy, she will not require adjuvant XRT. Although will further discuss with radiation oncology at the conclusion of her chemotherapy given her 2  positive axillary lymph nodes. An aromatase inhibitor would offer no benefit given the triple negative status of her disease. Proceed with cycle 2 of 12 of weekly Taxol today. Return to clinic in 1 week for consideration of cycle 3. 2.  Hypertension:  Patient's blood pressure is within normal limits today. Monitor. Continue current medications. 3.  Anemia: Secondary chemotherapy, monitor. 4.  Hypokalemia: Improved. Patient did not take her potassium chloride powder secondary to cost. Monitor.   Patient expressed understanding and was in agreement with this plan. She also understands that She can call clinic at any time with any questions, concerns, or complaints.   Breast cancer of upper-inner quadrant of left female breast [facility]   Staging form: Breast, AJCC 7th Edition     Pathologic stage from 01/06/2016: Stage IIA (T1c, N1a, M0) - Signed by Lloyd Huger, MD on 01/06/2016   Lloyd Huger, MD   04/27/2016 10:48 AM

## 2016-04-27 ENCOUNTER — Inpatient Hospital Stay (HOSPITAL_BASED_OUTPATIENT_CLINIC_OR_DEPARTMENT_OTHER): Payer: Medicare Other | Admitting: Oncology

## 2016-04-27 ENCOUNTER — Inpatient Hospital Stay: Payer: Medicare Other

## 2016-04-27 VITALS — BP 124/72 | HR 106 | Temp 97.4°F | Wt 149.9 lb

## 2016-04-27 VITALS — BP 126/76 | HR 80

## 2016-04-27 DIAGNOSIS — T451X5S Adverse effect of antineoplastic and immunosuppressive drugs, sequela: Secondary | ICD-10-CM

## 2016-04-27 DIAGNOSIS — M1990 Unspecified osteoarthritis, unspecified site: Secondary | ICD-10-CM

## 2016-04-27 DIAGNOSIS — Z7689 Persons encountering health services in other specified circumstances: Secondary | ICD-10-CM

## 2016-04-27 DIAGNOSIS — D6481 Anemia due to antineoplastic chemotherapy: Secondary | ICD-10-CM

## 2016-04-27 DIAGNOSIS — I10 Essential (primary) hypertension: Secondary | ICD-10-CM

## 2016-04-27 DIAGNOSIS — M818 Other osteoporosis without current pathological fracture: Secondary | ICD-10-CM

## 2016-04-27 DIAGNOSIS — Z8669 Personal history of other diseases of the nervous system and sense organs: Secondary | ICD-10-CM

## 2016-04-27 DIAGNOSIS — K1370 Unspecified lesions of oral mucosa: Secondary | ICD-10-CM

## 2016-04-27 DIAGNOSIS — Z171 Estrogen receptor negative status [ER-]: Secondary | ICD-10-CM

## 2016-04-27 DIAGNOSIS — C50212 Malignant neoplasm of upper-inner quadrant of left female breast: Secondary | ICD-10-CM | POA: Diagnosis not present

## 2016-04-27 DIAGNOSIS — Z8673 Personal history of transient ischemic attack (TIA), and cerebral infarction without residual deficits: Secondary | ICD-10-CM

## 2016-04-27 DIAGNOSIS — E876 Hypokalemia: Secondary | ICD-10-CM

## 2016-04-27 DIAGNOSIS — E7800 Pure hypercholesterolemia, unspecified: Secondary | ICD-10-CM

## 2016-04-27 DIAGNOSIS — E039 Hypothyroidism, unspecified: Secondary | ICD-10-CM

## 2016-04-27 DIAGNOSIS — K219 Gastro-esophageal reflux disease without esophagitis: Secondary | ICD-10-CM

## 2016-04-27 DIAGNOSIS — J029 Acute pharyngitis, unspecified: Secondary | ICD-10-CM

## 2016-04-27 DIAGNOSIS — Z803 Family history of malignant neoplasm of breast: Secondary | ICD-10-CM

## 2016-04-27 DIAGNOSIS — R5383 Other fatigue: Secondary | ICD-10-CM

## 2016-04-27 DIAGNOSIS — R531 Weakness: Secondary | ICD-10-CM

## 2016-04-27 LAB — CBC WITH DIFFERENTIAL/PLATELET
BASOS ABS: 0.1 10*3/uL (ref 0–0.1)
Basophils Relative: 1 %
EOS PCT: 0 %
Eosinophils Absolute: 0 10*3/uL (ref 0–0.7)
HEMATOCRIT: 23.6 % — AB (ref 35.0–47.0)
Hemoglobin: 8.2 g/dL — ABNORMAL LOW (ref 12.0–16.0)
LYMPHS PCT: 9 %
Lymphs Abs: 0.4 10*3/uL — ABNORMAL LOW (ref 1.0–3.6)
MCH: 31.1 pg (ref 26.0–34.0)
MCHC: 34.6 g/dL (ref 32.0–36.0)
MCV: 90 fL (ref 80.0–100.0)
Monocytes Absolute: 0.4 10*3/uL (ref 0.2–0.9)
Monocytes Relative: 9 %
NEUTROS ABS: 3.4 10*3/uL (ref 1.4–6.5)
Neutrophils Relative %: 81 %
PLATELETS: 276 10*3/uL (ref 150–440)
RBC: 2.63 MIL/uL — AB (ref 3.80–5.20)
RDW: 21.9 % — ABNORMAL HIGH (ref 11.5–14.5)
WBC: 4.3 10*3/uL (ref 3.6–11.0)

## 2016-04-27 LAB — COMPREHENSIVE METABOLIC PANEL
ALBUMIN: 3.8 g/dL (ref 3.5–5.0)
ALT: 18 U/L (ref 14–54)
AST: 28 U/L (ref 15–41)
Alkaline Phosphatase: 95 U/L (ref 38–126)
Anion gap: 10 (ref 5–15)
BUN: 12 mg/dL (ref 6–20)
CHLORIDE: 105 mmol/L (ref 101–111)
CO2: 22 mmol/L (ref 22–32)
CREATININE: 0.7 mg/dL (ref 0.44–1.00)
Calcium: 8.7 mg/dL — ABNORMAL LOW (ref 8.9–10.3)
GFR calc Af Amer: >60 mL/min (ref >60)
GLUCOSE: 157 mg/dL — AB (ref 65–99)
Potassium: 3.3 mmol/L — ABNORMAL LOW (ref 3.5–5.1)
Sodium: 137 mmol/L (ref 135–145)
Total Bilirubin: 0.8 mg/dL (ref 0.3–1.2)
Total Protein: 6.3 g/dL — ABNORMAL LOW (ref 6.5–8.1)

## 2016-04-27 MED ORDER — DEXTROSE 5 % IV SOLN
80.0000 mg/m2 | Freq: Once | INTRAVENOUS | Status: AC
  Administered 2016-04-27: 138 mg via INTRAVENOUS
  Filled 2016-04-27: qty 23

## 2016-04-27 MED ORDER — SODIUM CHLORIDE 0.9 % IV SOLN
Freq: Once | INTRAVENOUS | Status: AC
  Administered 2016-04-27: 11:00:00 via INTRAVENOUS
  Filled 2016-04-27: qty 1000

## 2016-04-27 MED ORDER — HEPARIN SOD (PORK) LOCK FLUSH 100 UNIT/ML IV SOLN
500.0000 [IU] | Freq: Once | INTRAVENOUS | Status: AC | PRN
  Administered 2016-04-27: 500 [IU]
  Filled 2016-04-27 (×2): qty 5

## 2016-04-27 MED ORDER — DIPHENHYDRAMINE HCL 50 MG/ML IJ SOLN
25.0000 mg | Freq: Once | INTRAMUSCULAR | Status: AC
  Administered 2016-04-27: 25 mg via INTRAVENOUS
  Filled 2016-04-27: qty 1

## 2016-04-27 MED ORDER — FAMOTIDINE IN NACL 20-0.9 MG/50ML-% IV SOLN
20.0000 mg | Freq: Once | INTRAVENOUS | Status: AC
  Administered 2016-04-27: 20 mg via INTRAVENOUS
  Filled 2016-04-27: qty 50

## 2016-04-27 MED ORDER — DEXAMETHASONE SODIUM PHOSPHATE 100 MG/10ML IJ SOLN
10.0000 mg | Freq: Once | INTRAMUSCULAR | Status: AC
  Administered 2016-04-27: 10 mg via INTRAVENOUS
  Filled 2016-04-27: qty 1

## 2016-04-27 MED ORDER — SODIUM CHLORIDE 0.9% FLUSH
10.0000 mL | INTRAVENOUS | Status: DC | PRN
  Administered 2016-04-27: 10 mL
  Filled 2016-04-27: qty 10

## 2016-04-27 NOTE — Progress Notes (Signed)
Patient ambulates independently without assistance, brought to exam room 3, accompanied by a family member.  Patient denies pain or discomfort at this time.  Vitals documented

## 2016-05-02 NOTE — Progress Notes (Signed)
.  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  INTERVAL HISTORY: Patient returns to clinic today for further evaluation and consideration of cycle 3 of 12 of weekly Taxol. She completed 4 cycles of adjuvant Adriamycin and Cytoxan. She continues to have persistent weakness and fatigue, but otherwise feels well. She denies any peripheral neuropathy or other neurologic complaints. She denies any recent fevers or illnesses. She has a good appetite and denies weight loss. She denies any pain. She denies any chest pain or shortness of breath. She denies any nausea, vomiting, constipation, or diarrhea. She has no urinary complaints. Patient offers no further specific complaints today.  REVIEW OF SYSTEMS:   Review of Systems  Constitutional: Positive for malaise/fatigue. Negative for fever and weight loss.  Respiratory: Negative.  Negative for cough and shortness of breath.   Cardiovascular: Negative.  Negative for chest pain.  Gastrointestinal: Negative.  Negative for abdominal pain.  Genitourinary: Negative.   Musculoskeletal: Negative.   Neurological: Positive for weakness.  Psychiatric/Behavioral: Negative.  The patient is not nervous/anxious.     As per HPI. Otherwise, a complete review of systems is negative.  PAST MEDICAL HISTORY: Past Medical History:  Diagnosis Date  . Cancer ([place])   . Generalized OA   . GERD (gastroesophageal reflux disease)   . Hypercholesteremia   . Hypertension   . Hypothyroid   . Migraine   . Osteoporosis   . Stroke ([place])   . TIA (transient ischemic attack)    July    PAST SURGICAL HISTORY: Past Surgical History:  Procedure Laterality  Date  . BREAST BIOPSY Left    bx/clip-neg  . BREAST BIOPSY Left 12/15/2015   path pending/us and stereo bx  . CATARACT EXTRACTION    . DILATION AND CURETTAGE OF UTERUS    . EYE SURGERY Right    Cataract Extraction with IOL  . PARTIAL MASTECTOMY WITH AXILLARY SENTINEL LYMPH NODE BIOPSY Left 01/13/2016   Procedure: PARTIAL MASTECTOMY;  Surgeon: Leonie Green, MD;  Location: [facility] ORS;  Service: General;  Laterality: Left;  . PORTACATH PLACEMENT Right 01/13/2016   Procedure: INSERTION PORT-A-CATH;  Surgeon: Leonie Green, MD;  Location: [facility] ORS;  Service: General;  Laterality: Right;  . RETINAL DETACHMENT SURGERY Right    Dr. Starling Manns, [facility]  . TONSILLECTOMY      FAMILY HISTORY Family History  Problem Relation Age of Onset  . Hypertension Mother   . Osteoarthritis Mother   . Dementia Father   . Breast cancer Cousin 9    mat cousin       ADVANCED DIRECTIVES:    HEALTH MAINTENANCE: Social History  Substance Use Topics  . Smoking status: Never Smoker  . Smokeless tobacco: Never Used  . Alcohol use No     Colonoscopy:  PAP:  Bone density:  Lipid panel:  No Known Allergies  Current Outpatient Prescriptions  Medication Sig Dispense Refill  . atorvastatin (LIPITOR) 80 MG tablet Take 1 tablet (80 mg total) by mouth daily at 6 PM. 30 tablet 0  . hydrochlorothiazide (HYDRODIURIL) 12.5 MG tablet Take 12.5 mg by mouth daily.     Marland Kitchen levothyroxine (SYNTHROID, LEVOTHROID) 50 MCG tablet Take 1 tablet by mouth daily.  0  .  potassium chloride (KLOR-CON) 20 MEQ packet Take 20 mEq by mouth 2 (two) times daily. 60 packet 3  . Vitamin D, Ergocalciferol, (DRISDOL) 50000 UNITS CAPS capsule Take 2,000 Units by mouth daily.      No current facility-administered medications for this visit.     OBJECTIVE: Vitals:   05/04/16 1024  BP: 131/76  Pulse: (!) 114  Resp: 18  Temp: 98.9 F (37.2 C)     Body mass index is 27.91 kg/m.    ECOG FS:0 - Asymptomatic  General:  Well-developed, well-nourished, no acute distress. Eyes: Pink conjunctiva, anicteric sclera. Breasts: Patient requested exam be deferred today. Lungs: Clear to auscultation bilaterally. Heart: Regular rate and rhythm. No rubs, murmurs, or gallops. Abdomen: Soft, nontender, nondistended. No organomegaly noted, normoactive bowel sounds. Musculoskeletal: No edema, cyanosis, or clubbing. Neuro: Alert, answering all questions appropriately. Cranial nerves grossly intact. Skin: No rashes or petechiae noted. Psych: Normal affect.   LAB RESULTS:  Lab Results  Component Value Date   NA 135 05/04/2016   K 3.5 05/04/2016   CL 104 05/04/2016   CO2 23 05/04/2016   GLUCOSE 163 (H) 05/04/2016   BUN 18 05/04/2016   CREATININE 0.65 05/04/2016   CALCIUM 9.0 05/04/2016   PROT 6.4 (L) 05/04/2016   ALBUMIN 3.8 05/04/2016   AST 27 05/04/2016   ALT 22 05/04/2016   ALKPHOS 102 05/04/2016   BILITOT 1.0 05/04/2016   GFRNONAA >60 05/04/2016   GFRAA >60 05/04/2016    Lab Results  Component Value Date   WBC 3.2 (L) 05/04/2016   NEUTROABS 2.4 05/04/2016   HGB 8.0 (L) 05/04/2016   HCT 23.7 (L) 05/04/2016   MCV 92.6 05/04/2016   PLT 176 05/04/2016     STUDIES: No results found.  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  PLAN:    1. 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 Pretreatment MUGA was 64.5%.  Patient completed 4 cycles of adjuvant Adriamycin and Cytoxan. Because patient had modified mastectomy, she will not require adjuvant XRT. Although will further discuss with radiation oncology at the conclusion of her chemotherapy given her 2 positive axillary lymph nodes. An aromatase inhibitor would offer  no benefit given the triple negative status of her disease. Proceed with cycle 3 of 12 of weekly Taxol today. Return to clinic in 1 week for laboratory work and consideration of cycle 4 and then in 2 weeks for further evaluation and consideration of cycle 5. 2.  Hypertension:  Patient's blood pressure is within normal limits today. Monitor. Continue current medications. 3.  Anemia: Secondary chemotherapy, monitor. 4.  Hypokalemia: Improved. Patient did not take her potassium chloride powder secondary to cost. Monitor. 5.  Leukopenia: Mild, monitor.   Patient expressed understanding and was in agreement with this plan. She also understands that She can call clinic at any time with any questions, concerns, or complaints.   Breast cancer of upper-inner quadrant of left female breast [facility])   Staging form: Breast, AJCC 7th Edition     Pathologic stage from 01/06/2016: Stage IIA (T1c, N1a, M0) - Signed by Lloyd Huger, MD on 01/06/2016   Lloyd Huger, MD   05/06/2016 4:40 PM

## 2016-05-04 ENCOUNTER — Inpatient Hospital Stay: Payer: Medicare Other | Attending: Oncology

## 2016-05-04 ENCOUNTER — Inpatient Hospital Stay (HOSPITAL_BASED_OUTPATIENT_CLINIC_OR_DEPARTMENT_OTHER): Payer: Medicare Other | Admitting: Oncology

## 2016-05-04 ENCOUNTER — Inpatient Hospital Stay: Payer: Medicare Other

## 2016-05-04 VITALS — BP 131/76 | HR 114 | Temp 98.9°F | Resp 18 | Wt 150.1 lb

## 2016-05-04 DIAGNOSIS — D6481 Anemia due to antineoplastic chemotherapy: Secondary | ICD-10-CM | POA: Diagnosis not present

## 2016-05-04 DIAGNOSIS — E876 Hypokalemia: Secondary | ICD-10-CM

## 2016-05-04 DIAGNOSIS — G629 Polyneuropathy, unspecified: Secondary | ICD-10-CM | POA: Insufficient documentation

## 2016-05-04 DIAGNOSIS — I10 Essential (primary) hypertension: Secondary | ICD-10-CM | POA: Insufficient documentation

## 2016-05-04 DIAGNOSIS — E7800 Pure hypercholesterolemia, unspecified: Secondary | ICD-10-CM

## 2016-05-04 DIAGNOSIS — Z79899 Other long term (current) drug therapy: Secondary | ICD-10-CM | POA: Diagnosis not present

## 2016-05-04 DIAGNOSIS — R531 Weakness: Secondary | ICD-10-CM

## 2016-05-04 DIAGNOSIS — M818 Other osteoporosis without current pathological fracture: Secondary | ICD-10-CM | POA: Insufficient documentation

## 2016-05-04 DIAGNOSIS — C50212 Malignant neoplasm of upper-inner quadrant of left female breast: Secondary | ICD-10-CM | POA: Insufficient documentation

## 2016-05-04 DIAGNOSIS — Z8669 Personal history of other diseases of the nervous system and sense organs: Secondary | ICD-10-CM

## 2016-05-04 DIAGNOSIS — Z171 Estrogen receptor negative status [ER-]: Secondary | ICD-10-CM | POA: Diagnosis not present

## 2016-05-04 DIAGNOSIS — D72819 Decreased white blood cell count, unspecified: Secondary | ICD-10-CM | POA: Diagnosis not present

## 2016-05-04 DIAGNOSIS — Z5111 Encounter for antineoplastic chemotherapy: Secondary | ICD-10-CM | POA: Diagnosis not present

## 2016-05-04 DIAGNOSIS — M1990 Unspecified osteoarthritis, unspecified site: Secondary | ICD-10-CM

## 2016-05-04 DIAGNOSIS — R5383 Other fatigue: Secondary | ICD-10-CM | POA: Diagnosis not present

## 2016-05-04 DIAGNOSIS — Z8673 Personal history of transient ischemic attack (TIA), and cerebral infarction without residual deficits: Secondary | ICD-10-CM | POA: Diagnosis not present

## 2016-05-04 DIAGNOSIS — E039 Hypothyroidism, unspecified: Secondary | ICD-10-CM | POA: Diagnosis not present

## 2016-05-04 DIAGNOSIS — Z9012 Acquired absence of left breast and nipple: Secondary | ICD-10-CM | POA: Insufficient documentation

## 2016-05-04 DIAGNOSIS — K219 Gastro-esophageal reflux disease without esophagitis: Secondary | ICD-10-CM

## 2016-05-04 DIAGNOSIS — Z803 Family history of malignant neoplasm of breast: Secondary | ICD-10-CM | POA: Insufficient documentation

## 2016-05-04 LAB — CBC WITH DIFFERENTIAL/PLATELET
BASOS PCT: 1 %
Basophils Absolute: 0 10*3/uL (ref 0–0.1)
EOS ABS: 0 10*3/uL (ref 0–0.7)
Eosinophils Relative: 1 %
HCT: 23.7 % — ABNORMAL LOW (ref 35.0–47.0)
HEMOGLOBIN: 8 g/dL — AB (ref 12.0–16.0)
LYMPHS ABS: 0.4 10*3/uL — AB (ref 1.0–3.6)
Lymphocytes Relative: 12 %
MCH: 31.5 pg (ref 26.0–34.0)
MCHC: 34 g/dL (ref 32.0–36.0)
MCV: 92.6 fL (ref 80.0–100.0)
Monocytes Absolute: 0.3 10*3/uL (ref 0.2–0.9)
Monocytes Relative: 9 %
NEUTROS PCT: 77 %
Neutro Abs: 2.4 10*3/uL (ref 1.4–6.5)
Platelets: 176 10*3/uL (ref 150–440)
RBC: 2.55 MIL/uL — AB (ref 3.80–5.20)
RDW: 23.2 % — ABNORMAL HIGH (ref 11.5–14.5)
WBC: 3.2 10*3/uL — AB (ref 3.6–11.0)

## 2016-05-04 LAB — COMPREHENSIVE METABOLIC PANEL
ALBUMIN: 3.8 g/dL (ref 3.5–5.0)
ALK PHOS: 102 U/L (ref 38–126)
ALT: 22 U/L (ref 14–54)
AST: 27 U/L (ref 15–41)
Anion gap: 8 (ref 5–15)
BUN: 18 mg/dL (ref 6–20)
CALCIUM: 9 mg/dL (ref 8.9–10.3)
CO2: 23 mmol/L (ref 22–32)
CREATININE: 0.65 mg/dL (ref 0.44–1.00)
Chloride: 104 mmol/L (ref 101–111)
GFR calc Af Amer: >60 mL/min (ref >60)
GFR calc non Af Amer: >60 mL/min (ref >60)
GLUCOSE: 163 mg/dL — AB (ref 65–99)
Potassium: 3.5 mmol/L (ref 3.5–5.1)
SODIUM: 135 mmol/L (ref 135–145)
Total Bilirubin: 1 mg/dL (ref 0.3–1.2)
Total Protein: 6.4 g/dL — ABNORMAL LOW (ref 6.5–8.1)

## 2016-05-04 MED ORDER — SODIUM CHLORIDE 0.9% FLUSH
10.0000 mL | INTRAVENOUS | Status: DC | PRN
  Filled 2016-05-04: qty 10

## 2016-05-04 MED ORDER — FAMOTIDINE IN NACL 20-0.9 MG/50ML-% IV SOLN
20.0000 mg | Freq: Once | INTRAVENOUS | Status: AC
  Administered 2016-05-04: 20 mg via INTRAVENOUS
  Filled 2016-05-04: qty 50

## 2016-05-04 MED ORDER — SODIUM CHLORIDE 0.9 % IV SOLN
Freq: Once | INTRAVENOUS | Status: AC
  Administered 2016-05-04: 11:00:00 via INTRAVENOUS
  Filled 2016-05-04: qty 1000

## 2016-05-04 MED ORDER — SODIUM CHLORIDE 0.9 % IV SOLN
10.0000 mg | Freq: Once | INTRAVENOUS | Status: AC
  Administered 2016-05-04: 10 mg via INTRAVENOUS
  Filled 2016-05-04: qty 1

## 2016-05-04 MED ORDER — PACLITAXEL CHEMO INJECTION 300 MG/50ML
80.0000 mg/m2 | Freq: Once | INTRAVENOUS | Status: AC
  Administered 2016-05-04: 138 mg via INTRAVENOUS
  Filled 2016-05-04: qty 23

## 2016-05-04 MED ORDER — HEPARIN SOD (PORK) LOCK FLUSH 100 UNIT/ML IV SOLN
500.0000 [IU] | Freq: Once | INTRAVENOUS | Status: AC | PRN
  Administered 2016-05-04: 500 [IU]
  Filled 2016-05-04: qty 5

## 2016-05-04 MED ORDER — DIPHENHYDRAMINE HCL 50 MG/ML IJ SOLN
25.0000 mg | Freq: Once | INTRAMUSCULAR | Status: AC
  Administered 2016-05-04: 25 mg via INTRAVENOUS
  Filled 2016-05-04: qty 1

## 2016-05-04 NOTE — Progress Notes (Signed)
States is feeling well today. After last treamtent had severe fatigue with decreased energy levels.

## 2016-05-11 ENCOUNTER — Inpatient Hospital Stay: Payer: Medicare Other

## 2016-05-11 DIAGNOSIS — C50212 Malignant neoplasm of upper-inner quadrant of left female breast: Secondary | ICD-10-CM | POA: Diagnosis not present

## 2016-05-11 DIAGNOSIS — Z95828 Presence of other vascular implants and grafts: Secondary | ICD-10-CM

## 2016-05-11 LAB — COMPREHENSIVE METABOLIC PANEL
ALBUMIN: 4 g/dL (ref 3.5–5.0)
ALT: 26 U/L (ref 14–54)
ANION GAP: 10 (ref 5–15)
AST: 33 U/L (ref 15–41)
Alkaline Phosphatase: 95 U/L (ref 38–126)
BUN: 14 mg/dL (ref 6–20)
CO2: 22 mmol/L (ref 22–32)
Calcium: 8.7 mg/dL — ABNORMAL LOW (ref 8.9–10.3)
Chloride: 104 mmol/L (ref 101–111)
Creatinine, Ser: 0.67 mg/dL (ref 0.44–1.00)
GFR calc Af Amer: >60 mL/min (ref >60)
GFR calc non Af Amer: >60 mL/min (ref >60)
GLUCOSE: 137 mg/dL — AB (ref 65–99)
POTASSIUM: 2.7 mmol/L — AB (ref 3.5–5.1)
SODIUM: 136 mmol/L (ref 135–145)
Total Bilirubin: 1.2 mg/dL (ref 0.3–1.2)
Total Protein: 6.6 g/dL (ref 6.5–8.1)

## 2016-05-11 LAB — CBC WITH DIFFERENTIAL/PLATELET
BASOS ABS: 0 10*3/uL (ref 0–0.1)
BASOS PCT: 1 %
EOS ABS: 0.1 10*3/uL (ref 0–0.7)
Eosinophils Relative: 3 %
HCT: 23.6 % — ABNORMAL LOW (ref 35.0–47.0)
HEMOGLOBIN: 8.2 g/dL — AB (ref 12.0–16.0)
Lymphocytes Relative: 10 %
Lymphs Abs: 0.4 10*3/uL — ABNORMAL LOW (ref 1.0–3.6)
MCH: 32.3 pg (ref 26.0–34.0)
MCHC: 34.7 g/dL (ref 32.0–36.0)
MCV: 93.1 fL (ref 80.0–100.0)
MONO ABS: 0.3 10*3/uL (ref 0.2–0.9)
MONOS PCT: 8 %
NEUTROS PCT: 78 %
Neutro Abs: 2.9 10*3/uL (ref 1.4–6.5)
Platelets: 190 10*3/uL (ref 150–440)
RBC: 2.54 MIL/uL — ABNORMAL LOW (ref 3.80–5.20)
RDW: 22.3 % — AB (ref 11.5–14.5)
WBC: 3.7 10*3/uL (ref 3.6–11.0)

## 2016-05-11 MED ORDER — SODIUM CHLORIDE 0.9% FLUSH
10.0000 mL | INTRAVENOUS | Status: DC | PRN
  Administered 2016-05-11: 10 mL via INTRAVENOUS
  Filled 2016-05-11: qty 10

## 2016-05-11 MED ORDER — DIPHENHYDRAMINE HCL 50 MG/ML IJ SOLN
25.0000 mg | Freq: Once | INTRAMUSCULAR | Status: AC
  Administered 2016-05-11: 25 mg via INTRAVENOUS
  Filled 2016-05-11: qty 1

## 2016-05-11 MED ORDER — DEXAMETHASONE SODIUM PHOSPHATE 10 MG/ML IJ SOLN
10.0000 mg | Freq: Once | INTRAMUSCULAR | Status: AC
  Administered 2016-05-11: 10 mg via INTRAVENOUS
  Filled 2016-05-11: qty 1

## 2016-05-11 MED ORDER — SODIUM CHLORIDE 0.9 % IV SOLN
Freq: Once | INTRAVENOUS | Status: AC
  Administered 2016-05-11: 10:00:00 via INTRAVENOUS
  Filled 2016-05-11: qty 250

## 2016-05-11 MED ORDER — DEXTROSE 5 % IV SOLN
80.0000 mg/m2 | Freq: Once | INTRAVENOUS | Status: AC
  Administered 2016-05-11: 138 mg via INTRAVENOUS
  Filled 2016-05-11: qty 23

## 2016-05-11 MED ORDER — SODIUM CHLORIDE 0.9 % IV SOLN: 40.0000 meq | Freq: Once | INTRAVENOUS | Status: DC

## 2016-05-11 MED ORDER — FAMOTIDINE IN NACL 20-0.9 MG/50ML-% IV SOLN
20.0000 mg | Freq: Once | INTRAVENOUS | Status: AC
  Administered 2016-05-11: 20 mg via INTRAVENOUS
  Filled 2016-05-11: qty 50

## 2016-05-11 MED ORDER — SODIUM CHLORIDE 0.9 % IV SOLN: 10.0000 mg | Freq: Once | INTRAVENOUS | Status: DC

## 2016-05-11 MED ORDER — HEPARIN SOD (PORK) LOCK FLUSH 100 UNIT/ML IV SOLN
500.0000 [IU] | Freq: Once | INTRAVENOUS | Status: AC
  Administered 2016-05-11: 500 [IU] via INTRAVENOUS
  Filled 2016-05-11: qty 5

## 2016-05-11 MED ORDER — SODIUM CHLORIDE 0.9 % IV SOLN
Freq: Once | INTRAVENOUS | Status: AC
Start: 2016-05-11 — End: 2016-05-11
  Administered 2016-05-11: 10:00:00 via INTRAVENOUS
  Filled 2016-05-11: qty 1000

## 2016-05-16 NOTE — Progress Notes (Signed)
.  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  INTERVAL HISTORY: Patient returns to clinic today for further evaluation and consideration of cycle 5 of 12 of weekly Taxol. She completed 4 cycles of adjuvant Adriamycin and Cytoxan. She continues to have persistent weakness and fatigue, but otherwise feels well. She denies any peripheral neuropathy or other neurologic complaints. She denies any recent fevers or illnesses. She has a good appetite and denies weight loss. She denies any pain. She denies any chest pain or shortness of breath. She denies any nausea, vomiting, constipation, or diarrhea. She has no urinary complaints. Patient offers no further specific complaints today.  REVIEW OF SYSTEMS:   Review of Systems  Constitutional: Positive for malaise/fatigue. Negative for fever and weight loss.  Respiratory: Negative.  Negative for cough and shortness of breath.   Cardiovascular: Negative.  Negative for chest pain.  Gastrointestinal: Negative.  Negative for abdominal pain.  Genitourinary: Negative.   Musculoskeletal: Negative.   Neurological: Positive for weakness.  Psychiatric/Behavioral: Negative.  The patient is not nervous/anxious.     As per HPI. Otherwise, a complete review of systems is negative.  PAST MEDICAL HISTORY: Past Medical History:  Diagnosis Date  . Cancer ([place])   . Generalized OA   . GERD (gastroesophageal reflux disease)   . Hypercholesteremia   . Hypertension   . Hypothyroid   . Migraine   . Osteoporosis   . Stroke ([place])   . TIA (transient ischemic attack)    July    PAST SURGICAL HISTORY: Past Surgical History:  Procedure Laterality  Date  . BREAST BIOPSY Left    bx/clip-neg  . BREAST BIOPSY Left 12/15/2015   path pending/us and stereo bx  . CATARACT EXTRACTION    . DILATION AND CURETTAGE OF UTERUS    . EYE SURGERY Right    Cataract Extraction with IOL  . PARTIAL MASTECTOMY WITH AXILLARY SENTINEL LYMPH NODE BIOPSY Left 01/13/2016   Procedure: PARTIAL MASTECTOMY;  Surgeon: Leonie Green, MD;  Location: [facility] ORS;  Service: General;  Laterality: Left;  . PORTACATH PLACEMENT Right 01/13/2016   Procedure: INSERTION PORT-A-CATH;  Surgeon: Leonie Green, MD;  Location: [facility] ORS;  Service: General;  Laterality: Right;  . RETINAL DETACHMENT SURGERY Right    Dr. Starling Manns, [facility]  . TONSILLECTOMY      FAMILY HISTORY Family History  Problem Relation Age of Onset  . Hypertension Mother   . Osteoarthritis Mother   . Dementia Father   . Breast cancer Cousin 54    mat cousin       ADVANCED DIRECTIVES:    HEALTH MAINTENANCE: Social History  Substance Use Topics  . Smoking status: Never Smoker  . Smokeless tobacco: Never Used  . Alcohol use No     Colonoscopy:  PAP:  Bone density:  Lipid panel:  No Known Allergies  Current Outpatient Prescriptions  Medication Sig Dispense Refill  . atorvastatin (LIPITOR) 80 MG tablet Take 1 tablet (80 mg total) by mouth daily at 6 PM. 30 tablet 0  . hydrochlorothiazide (HYDRODIURIL) 12.5 MG tablet Take 12.5 mg by mouth daily.     Marland Kitchen levothyroxine (SYNTHROID, LEVOTHROID) 50 MCG tablet Take 1 tablet by mouth daily.  0  .  potassium chloride (KLOR-CON) 20 MEQ packet Take 20 mEq by mouth 2 (two) times daily. 60 packet 3  . Vitamin D, Ergocalciferol, (DRISDOL) 50000 UNITS CAPS capsule Take 2,000 Units by mouth daily.      No current facility-administered medications for this visit.    Facility-Administered Medications Ordered in Other Visits  Medication Dose Route Frequency Provider Last Rate Last Dose  . 0.9 %  sodium chloride infusion   Intravenous Once Lloyd Huger, MD      . heparin lock flush 100 unit/mL  500 Units Intracatheter Once PRN Lloyd Huger, MD        OBJECTIVE: Vitals:   05/18/16 0904  BP: 134/74  Pulse: (!) 116  Resp: 18  Temp: 99 F (37.2 C)     Body mass index is 27.46 kg/m.    ECOG FS:0 - Asymptomatic  General: Well-developed, well-nourished, no acute distress. Eyes: Pink conjunctiva, anicteric sclera. Breasts: Patient requested exam be deferred today. Lungs: Clear to auscultation bilaterally. Heart: Regular rate and rhythm. No rubs, murmurs, or gallops. Abdomen: Soft, nontender, nondistended. No organomegaly noted, normoactive bowel sounds. Musculoskeletal: No edema, cyanosis, or clubbing. Neuro: Alert, answering all questions appropriately. Cranial nerves grossly intact. Skin: No rashes or petechiae noted. Psych: Normal affect.   LAB RESULTS:  Lab Results  Component Value Date   NA 135 05/25/2016   K 2.9 (L) 05/25/2016   CL 103 05/25/2016   CO2 21 (L) 05/25/2016   GLUCOSE 219 (H) 05/25/2016   BUN 17 05/25/2016   CREATININE 0.67 05/25/2016   CALCIUM 9.0 05/25/2016   PROT 6.7 05/25/2016   ALBUMIN 4.0 05/25/2016   AST 35 05/25/2016   ALT 23 05/25/2016   ALKPHOS 104 05/25/2016   BILITOT 1.7 (H) 05/25/2016   GFRNONAA >60 05/25/2016   GFRAA >60 05/25/2016    Lab Results  Component Value Date   WBC 2.1 (L) 05/25/2016   NEUTROABS 1.3 (L) 05/25/2016   HGB 7.9 (L) 05/25/2016   HCT 22.9 (L) 05/25/2016   MCV 96.3 05/25/2016   PLT 169 05/25/2016     STUDIES: No results found.  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  PLAN:    1. 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 Pretreatment MUGA was 64.5%.   Patient completed 4 cycles of adjuvant Adriamycin and Cytoxan. Because patient had modified mastectomy, she will not require adjuvant XRT. Although will further discuss with radiation oncology at the conclusion of her chemotherapy given her 2 positive axillary lymph nodes. An aromatase inhibitor would offer no benefit given the triple negative status of her disease. Proceed with cycle 5 of 12 of weekly Taxol today. Return to clinic in 1 week for laboratory work and consideration of cycle 6. 2.  Hypertension:  Patient's blood pressure is within normal limits today. Monitor. Continue current medications. 3.  Anemia: Secondary chemotherapy, monitor. 4.  Hypokalemia: Improved. Patient did not take her potassium chloride powder secondary to cost. Monitor. 5.  Leukopenia: Mild, monitor.   Patient expressed understanding and was in agreement with this plan. She also understands that She can call clinic at any time with any questions, concerns, or complaints.   Breast cancer of upper-inner quadrant of left female breast [facility])   Staging  form: Breast, AJCC 7th Edition     Pathologic stage from 01/06/2016: Stage IIA (T1c, N1a, M0) - Signed by Lloyd Huger, MD on 01/06/2016   Lloyd Huger, MD   05/25/2016 12:33 PM

## 2016-05-18 ENCOUNTER — Inpatient Hospital Stay (HOSPITAL_BASED_OUTPATIENT_CLINIC_OR_DEPARTMENT_OTHER): Payer: Medicare Other | Admitting: Oncology

## 2016-05-18 ENCOUNTER — Inpatient Hospital Stay: Payer: Medicare Other

## 2016-05-18 VITALS — BP 134/74 | HR 116 | Temp 99.0°F | Resp 18 | Wt 147.7 lb

## 2016-05-18 VITALS — BP 141/78 | HR 106 | Temp 98.6°F | Resp 18

## 2016-05-18 DIAGNOSIS — Z171 Estrogen receptor negative status [ER-]: Secondary | ICD-10-CM

## 2016-05-18 DIAGNOSIS — R5383 Other fatigue: Secondary | ICD-10-CM

## 2016-05-18 DIAGNOSIS — Z79899 Other long term (current) drug therapy: Secondary | ICD-10-CM

## 2016-05-18 DIAGNOSIS — M818 Other osteoporosis without current pathological fracture: Secondary | ICD-10-CM

## 2016-05-18 DIAGNOSIS — Z803 Family history of malignant neoplasm of breast: Secondary | ICD-10-CM

## 2016-05-18 DIAGNOSIS — E039 Hypothyroidism, unspecified: Secondary | ICD-10-CM

## 2016-05-18 DIAGNOSIS — C50212 Malignant neoplasm of upper-inner quadrant of left female breast: Secondary | ICD-10-CM

## 2016-05-18 DIAGNOSIS — D6481 Anemia due to antineoplastic chemotherapy: Secondary | ICD-10-CM

## 2016-05-18 DIAGNOSIS — Z9012 Acquired absence of left breast and nipple: Secondary | ICD-10-CM

## 2016-05-18 DIAGNOSIS — Z8673 Personal history of transient ischemic attack (TIA), and cerebral infarction without residual deficits: Secondary | ICD-10-CM

## 2016-05-18 DIAGNOSIS — E7800 Pure hypercholesterolemia, unspecified: Secondary | ICD-10-CM

## 2016-05-18 DIAGNOSIS — K219 Gastro-esophageal reflux disease without esophagitis: Secondary | ICD-10-CM

## 2016-05-18 DIAGNOSIS — G629 Polyneuropathy, unspecified: Secondary | ICD-10-CM

## 2016-05-18 DIAGNOSIS — M1990 Unspecified osteoarthritis, unspecified site: Secondary | ICD-10-CM

## 2016-05-18 DIAGNOSIS — D72819 Decreased white blood cell count, unspecified: Secondary | ICD-10-CM

## 2016-05-18 DIAGNOSIS — E876 Hypokalemia: Secondary | ICD-10-CM

## 2016-05-18 DIAGNOSIS — Z8669 Personal history of other diseases of the nervous system and sense organs: Secondary | ICD-10-CM

## 2016-05-18 DIAGNOSIS — I10 Essential (primary) hypertension: Secondary | ICD-10-CM

## 2016-05-18 DIAGNOSIS — R531 Weakness: Secondary | ICD-10-CM

## 2016-05-18 LAB — CBC WITH DIFFERENTIAL/PLATELET
BASOS ABS: 0 10*3/uL (ref 0–0.1)
BASOS PCT: 1 %
EOS PCT: 4 %
Eosinophils Absolute: 0.1 10*3/uL (ref 0–0.7)
HCT: 24 % — ABNORMAL LOW (ref 35.0–47.0)
Hemoglobin: 8.2 g/dL — ABNORMAL LOW (ref 12.0–16.0)
LYMPHS PCT: 18 %
Lymphs Abs: 0.5 10*3/uL — ABNORMAL LOW (ref 1.0–3.6)
MCH: 32.5 pg (ref 26.0–34.0)
MCHC: 34.1 g/dL (ref 32.0–36.0)
MCV: 95.2 fL (ref 80.0–100.0)
MONO ABS: 0.2 10*3/uL (ref 0.2–0.9)
Monocytes Relative: 9 %
NEUTROS ABS: 1.8 10*3/uL (ref 1.4–6.5)
Neutrophils Relative %: 68 %
PLATELETS: 168 10*3/uL (ref 150–440)
RBC: 2.52 MIL/uL — AB (ref 3.80–5.20)
RDW: 21.3 % — AB (ref 11.5–14.5)
WBC: 2.7 10*3/uL — AB (ref 3.6–11.0)

## 2016-05-18 LAB — COMPREHENSIVE METABOLIC PANEL
ALBUMIN: 3.9 g/dL (ref 3.5–5.0)
ALT: 21 U/L (ref 14–54)
AST: 28 U/L (ref 15–41)
Alkaline Phosphatase: 100 U/L (ref 38–126)
Anion gap: 9 (ref 5–15)
BUN: 13 mg/dL (ref 6–20)
CHLORIDE: 105 mmol/L (ref 101–111)
CO2: 23 mmol/L (ref 22–32)
CREATININE: 0.57 mg/dL (ref 0.44–1.00)
Calcium: 9.2 mg/dL (ref 8.9–10.3)
GFR calc Af Amer: >60 mL/min (ref >60)
GLUCOSE: 130 mg/dL — AB (ref 65–99)
POTASSIUM: 3.6 mmol/L (ref 3.5–5.1)
Sodium: 137 mmol/L (ref 135–145)
Total Bilirubin: 1.5 mg/dL — ABNORMAL HIGH (ref 0.3–1.2)
Total Protein: 6.9 g/dL (ref 6.5–8.1)

## 2016-05-18 MED ORDER — SODIUM CHLORIDE 0.9% FLUSH
10.0000 mL | INTRAVENOUS | Status: DC | PRN
  Administered 2016-05-18: 10 mL
  Filled 2016-05-18: qty 10

## 2016-05-18 MED ORDER — HEPARIN SOD (PORK) LOCK FLUSH 100 UNIT/ML IV SOLN
500.0000 [IU] | Freq: Once | INTRAVENOUS | Status: AC | PRN
  Administered 2016-05-18: 500 [IU]
  Filled 2016-05-18: qty 5

## 2016-05-18 MED ORDER — PACLITAXEL CHEMO INJECTION 300 MG/50ML
80.0000 mg/m2 | Freq: Once | INTRAVENOUS | Status: AC
  Administered 2016-05-18: 138 mg via INTRAVENOUS
  Filled 2016-05-18: qty 23

## 2016-05-18 MED ORDER — DIPHENHYDRAMINE HCL 50 MG/ML IJ SOLN
25.0000 mg | Freq: Once | INTRAMUSCULAR | Status: AC
  Administered 2016-05-18: 25 mg via INTRAVENOUS
  Filled 2016-05-18: qty 1

## 2016-05-18 MED ORDER — SODIUM CHLORIDE 0.9 % IV SOLN
Freq: Once | INTRAVENOUS | Status: AC
  Administered 2016-05-18: 10:00:00 via INTRAVENOUS
  Filled 2016-05-18: qty 1000

## 2016-05-18 MED ORDER — DEXAMETHASONE SODIUM PHOSPHATE 10 MG/ML IJ SOLN
10.0000 mg | Freq: Once | INTRAMUSCULAR | Status: AC
  Administered 2016-05-18: 10 mg via INTRAVENOUS
  Filled 2016-05-18: qty 1

## 2016-05-18 MED ORDER — SODIUM CHLORIDE 0.9 % IV SOLN: 10.0000 mg | Freq: Once | INTRAVENOUS | Status: DC

## 2016-05-18 MED ORDER — FAMOTIDINE IN NACL 20-0.9 MG/50ML-% IV SOLN
20.0000 mg | Freq: Once | INTRAVENOUS | Status: AC
  Administered 2016-05-18: 20 mg via INTRAVENOUS
  Filled 2016-05-18: qty 50

## 2016-05-18 NOTE — Progress Notes (Signed)
States is feeling well. Offers no complaints. 

## 2016-05-23 NOTE — Progress Notes (Signed)
.  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  INTERVAL HISTORY: Patient returns to clinic today for further evaluation and consideration of cycle 6 of 12 of weekly Taxol. She continues to have persistent weakness and fatigue, but otherwise feels well. She denies any peripheral neuropathy or other neurologic complaints. She denies any recent fevers or illnesses. She has a good appetite and denies weight loss. She denies any pain. She denies any chest pain or shortness of breath. She denies any nausea, vomiting, constipation, or diarrhea. She has no urinary complaints. Patient offers no further specific complaints today.  REVIEW OF SYSTEMS:   Review of Systems  Constitutional: Positive for malaise/fatigue. Negative for fever and weight loss.  Respiratory: Negative.  Negative for cough and shortness of breath.   Cardiovascular: Negative.  Negative for chest pain.  Gastrointestinal: Negative.  Negative for abdominal pain.  Genitourinary: Negative.   Musculoskeletal: Negative.   Neurological: Positive for weakness.  Psychiatric/Behavioral: Negative.  The patient is not nervous/anxious.    As per HPI. Otherwise, a complete review of systems is negative.   PAST MEDICAL HISTORY: Past Medical History:  Diagnosis Date  . Cancer ([place])   . Generalized OA   . GERD (gastroesophageal reflux disease)   . Hypercholesteremia   . Hypertension   . Hypothyroid   . Migraine   . Osteoporosis   . Stroke ([place])   . TIA (transient ischemic attack)    July    PAST SURGICAL HISTORY: Past Surgical History:  Procedure Laterality Date  . BREAST BIOPSY Left    bx/clip-neg  . BREAST  BIOPSY Left 12/15/2015   path pending/us and stereo bx  . CATARACT EXTRACTION    . DILATION AND CURETTAGE OF UTERUS    . EYE SURGERY Right    Cataract Extraction with IOL  . PARTIAL MASTECTOMY WITH AXILLARY SENTINEL LYMPH NODE BIOPSY Left 01/13/2016   Procedure: PARTIAL MASTECTOMY;  Surgeon: Leonie Green, MD;  Location: [facility] ORS;  Service: General;  Laterality: Left;  . PORTACATH PLACEMENT Right 01/13/2016   Procedure: INSERTION PORT-A-CATH;  Surgeon: Leonie Green, MD;  Location: [facility] ORS;  Service: General;  Laterality: Right;  . RETINAL DETACHMENT SURGERY Right    Dr. Starling Manns, [facility]  . TONSILLECTOMY      FAMILY HISTORY Family History  Problem Relation Age of Onset  . Hypertension Mother   . Osteoarthritis Mother   . Dementia Father   . Breast cancer Cousin 34    mat cousin       ADVANCED DIRECTIVES:    HEALTH MAINTENANCE: Social History  Substance Use Topics  . Smoking status: Never Smoker  . Smokeless tobacco: Never Used  . Alcohol use No     Colonoscopy:  PAP:  Bone density:  Lipid panel:  No Known Allergies  Current Outpatient Prescriptions  Medication Sig Dispense Refill  . atorvastatin (LIPITOR) 80 MG tablet Take 1 tablet (80 mg total) by mouth daily at 6 PM. 30 tablet 0  . hydrochlorothiazide (HYDRODIURIL) 12.5 MG tablet Take 12.5 mg by mouth daily.     Marland Kitchen levothyroxine (SYNTHROID, LEVOTHROID) 50 MCG tablet Take 1 tablet by mouth daily.  0  . potassium chloride (KLOR-CON) 20 MEQ packet Take 20  mEq by mouth 2 (two) times daily. 60 packet 3  . Vitamin D, Ergocalciferol, (DRISDOL) 50000 UNITS CAPS capsule Take 2,000 Units by mouth daily.      No current facility-administered medications for this visit.    Facility-Administered Medications Ordered in Other Visits  Medication Dose Route Frequency Provider Last Rate Last Dose  . 0.9 %  sodium chloride infusion   Intravenous Once Lloyd Huger, MD      . heparin lock flush 100 unit/mL  500  Units Intracatheter Once PRN Lloyd Huger, MD        OBJECTIVE: Vitals:   05/25/16 0957  BP: 107/72  Pulse: (!) 128  Resp: 18  Temp: 98.4 F (36.9 C)     Body mass index is 27.31 kg/m.    ECOG FS:0 - Asymptomatic  General: Well-developed, well-nourished, no acute distress. Eyes: Pink conjunctiva, anicteric sclera. Breasts: Patient requested exam be deferred today. Lungs: Clear to auscultation bilaterally. Heart: Regular rate and rhythm. No rubs, murmurs, or gallops. Abdomen: Soft, nontender, nondistended. No organomegaly noted, normoactive bowel sounds. Musculoskeletal: No edema, cyanosis, or clubbing. Neuro: Alert, answering all questions appropriately. Cranial nerves grossly intact. Skin: No rashes or petechiae noted. Psych: Normal affect.   LAB RESULTS:  Lab Results  Component Value Date   NA 135 05/25/2016   K 2.9 (L) 05/25/2016   CL 103 05/25/2016   CO2 21 (L) 05/25/2016   GLUCOSE 219 (H) 05/25/2016   BUN 17 05/25/2016   CREATININE 0.67 05/25/2016   CALCIUM 9.0 05/25/2016   PROT 6.7 05/25/2016   ALBUMIN 4.0 05/25/2016   AST 35 05/25/2016   ALT 23 05/25/2016   ALKPHOS 104 05/25/2016   BILITOT 1.7 (H) 05/25/2016   GFRNONAA >60 05/25/2016   GFRAA >60 05/25/2016    Lab Results  Component Value Date   WBC 2.1 (L) 05/25/2016   NEUTROABS 1.3 (L) 05/25/2016   HGB 7.9 (L) 05/25/2016   HCT 22.9 (L) 05/25/2016   MCV 96.3 05/25/2016   PLT 169 05/25/2016     STUDIES: No results found.  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  PLAN:    1. 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 Pretreatment MUGA was 64.5%.  Patient completed 4 cycles of adjuvant Adriamycin and  Cytoxan. Because patient had modified mastectomy, she will not require adjuvant XRT. Although will further discuss with radiation oncology at the conclusion of her chemotherapy given her 2 positive axillary lymph nodes. An aromatase inhibitor would offer no benefit given the triple negative status of her disease. Proceed with cycle 6 of 12 of weekly Taxol today. Return to clinic in 1 week and 2 weeks for laboratory work and treatment. Patient will then return to clinic in 3 weeks for evaluation and consideration of cycle 9.  2.  Hypertension: Patient's blood pressure is within normal limits today. Monitor. Continue current medications. 3.  Anemia: Secondary chemotherapy, monitor. Patient's hemoglobin is slowly trending down, she may require a blood transfusion in the near future. 4.  Hypokalemia: Patient's potassium is decreased again today, continue oral supplementation. 5.  Leukopenia: Slightly worse today. Continue to monitor closely. 6.  Elevated bilirubin: Patient's bilirubin has been trending up over the past several weeks. Given her worsening anemia  will assess if this is secondary to hemolysis. Patient may require an hepatic ultrasound in the near future.   Patient expressed understanding and was in agreement with this plan. She also understands that She can call clinic at any time with any questions, concerns, or complaints.   Breast cancer of upper-inner quadrant of left female breast [facility])   Staging form: Breast, AJCC 7th Edition     Pathologic stage from 01/06/2016: Stage IIA (T1c, N1a, M0) - Signed by Lloyd Huger, MD on 01/06/2016   Lloyd Huger, MD   05/25/2016 12:35 PM

## 2016-05-25 ENCOUNTER — Inpatient Hospital Stay (HOSPITAL_BASED_OUTPATIENT_CLINIC_OR_DEPARTMENT_OTHER): Payer: Medicare Other | Admitting: Oncology

## 2016-05-25 ENCOUNTER — Inpatient Hospital Stay: Payer: Medicare Other

## 2016-05-25 VITALS — BP 107/72 | HR 128 | Temp 98.4°F | Resp 18 | Wt 146.9 lb

## 2016-05-25 VITALS — BP 115/70 | HR 110 | Temp 98.1°F | Resp 18

## 2016-05-25 DIAGNOSIS — I10 Essential (primary) hypertension: Secondary | ICD-10-CM

## 2016-05-25 DIAGNOSIS — G629 Polyneuropathy, unspecified: Secondary | ICD-10-CM

## 2016-05-25 DIAGNOSIS — D6481 Anemia due to antineoplastic chemotherapy: Secondary | ICD-10-CM

## 2016-05-25 DIAGNOSIS — D72819 Decreased white blood cell count, unspecified: Secondary | ICD-10-CM

## 2016-05-25 DIAGNOSIS — M1990 Unspecified osteoarthritis, unspecified site: Secondary | ICD-10-CM

## 2016-05-25 DIAGNOSIS — Z9012 Acquired absence of left breast and nipple: Secondary | ICD-10-CM

## 2016-05-25 DIAGNOSIS — E876 Hypokalemia: Secondary | ICD-10-CM | POA: Diagnosis not present

## 2016-05-25 DIAGNOSIS — Z8669 Personal history of other diseases of the nervous system and sense organs: Secondary | ICD-10-CM

## 2016-05-25 DIAGNOSIS — R531 Weakness: Secondary | ICD-10-CM

## 2016-05-25 DIAGNOSIS — C50212 Malignant neoplasm of upper-inner quadrant of left female breast: Secondary | ICD-10-CM

## 2016-05-25 DIAGNOSIS — E7800 Pure hypercholesterolemia, unspecified: Secondary | ICD-10-CM

## 2016-05-25 DIAGNOSIS — E039 Hypothyroidism, unspecified: Secondary | ICD-10-CM

## 2016-05-25 DIAGNOSIS — Z171 Estrogen receptor negative status [ER-]: Secondary | ICD-10-CM | POA: Diagnosis not present

## 2016-05-25 DIAGNOSIS — Z8673 Personal history of transient ischemic attack (TIA), and cerebral infarction without residual deficits: Secondary | ICD-10-CM

## 2016-05-25 DIAGNOSIS — M818 Other osteoporosis without current pathological fracture: Secondary | ICD-10-CM

## 2016-05-25 DIAGNOSIS — K219 Gastro-esophageal reflux disease without esophagitis: Secondary | ICD-10-CM

## 2016-05-25 DIAGNOSIS — Z79899 Other long term (current) drug therapy: Secondary | ICD-10-CM

## 2016-05-25 DIAGNOSIS — Z803 Family history of malignant neoplasm of breast: Secondary | ICD-10-CM

## 2016-05-25 DIAGNOSIS — R5383 Other fatigue: Secondary | ICD-10-CM

## 2016-05-25 LAB — CBC WITH DIFFERENTIAL/PLATELET
Basophils Absolute: 0 10*3/uL (ref 0–0.1)
Basophils Relative: 1 %
Eosinophils Absolute: 0.1 10*3/uL (ref 0–0.7)
Eosinophils Relative: 4 %
HEMATOCRIT: 22.9 % — AB (ref 35.0–47.0)
Hemoglobin: 7.9 g/dL — ABNORMAL LOW (ref 12.0–16.0)
LYMPHS ABS: 0.5 10*3/uL — AB (ref 1.0–3.6)
LYMPHS PCT: 22 %
MCH: 32.9 pg (ref 26.0–34.0)
MCHC: 34.2 g/dL (ref 32.0–36.0)
MCV: 96.3 fL (ref 80.0–100.0)
MONO ABS: 0.2 10*3/uL (ref 0.2–0.9)
MONOS PCT: 10 %
NEUTROS ABS: 1.3 10*3/uL — AB (ref 1.4–6.5)
Neutrophils Relative %: 63 %
Platelets: 169 10*3/uL (ref 150–440)
RBC: 2.38 MIL/uL — ABNORMAL LOW (ref 3.80–5.20)
RDW: 20.5 % — AB (ref 11.5–14.5)
WBC: 2.1 10*3/uL — ABNORMAL LOW (ref 3.6–11.0)

## 2016-05-25 LAB — COMPREHENSIVE METABOLIC PANEL
ALK PHOS: 104 U/L (ref 38–126)
ALT: 23 U/L (ref 14–54)
ANION GAP: 11 (ref 5–15)
AST: 35 U/L (ref 15–41)
Albumin: 4 g/dL (ref 3.5–5.0)
BILIRUBIN TOTAL: 1.7 mg/dL — AB (ref 0.3–1.2)
BUN: 17 mg/dL (ref 6–20)
CALCIUM: 9 mg/dL (ref 8.9–10.3)
CO2: 21 mmol/L — ABNORMAL LOW (ref 22–32)
Chloride: 103 mmol/L (ref 101–111)
Creatinine, Ser: 0.67 mg/dL (ref 0.44–1.00)
GFR calc Af Amer: >60 mL/min (ref >60)
Glucose, Bld: 219 mg/dL — ABNORMAL HIGH (ref 65–99)
POTASSIUM: 2.9 mmol/L — AB (ref 3.5–5.1)
Sodium: 135 mmol/L (ref 135–145)
TOTAL PROTEIN: 6.7 g/dL (ref 6.5–8.1)

## 2016-05-25 MED ORDER — PACLITAXEL CHEMO INJECTION 300 MG/50ML
80.0000 mg/m2 | Freq: Once | INTRAVENOUS | Status: AC
  Administered 2016-05-25: 138 mg via INTRAVENOUS
  Filled 2016-05-25: qty 23

## 2016-05-25 MED ORDER — DEXAMETHASONE SODIUM PHOSPHATE 10 MG/ML IJ SOLN
10.0000 mg | Freq: Once | INTRAMUSCULAR | Status: AC
  Administered 2016-05-25: 10 mg via INTRAVENOUS
  Filled 2016-05-25: qty 1

## 2016-05-25 MED ORDER — HEPARIN SOD (PORK) LOCK FLUSH 100 UNIT/ML IV SOLN
500.0000 [IU] | Freq: Once | INTRAVENOUS | Status: AC | PRN
  Administered 2016-05-25: 500 [IU]
  Filled 2016-05-25: qty 5

## 2016-05-25 MED ORDER — SODIUM CHLORIDE 0.9 % IV SOLN: 10.0000 mg | Freq: Once | INTRAVENOUS | Status: DC

## 2016-05-25 MED ORDER — FAMOTIDINE IN NACL 20-0.9 MG/50ML-% IV SOLN
20.0000 mg | Freq: Once | INTRAVENOUS | Status: AC
  Administered 2016-05-25: 20 mg via INTRAVENOUS
  Filled 2016-05-25: qty 50

## 2016-05-25 MED ORDER — DIPHENHYDRAMINE HCL 50 MG/ML IJ SOLN
25.0000 mg | Freq: Once | INTRAMUSCULAR | Status: AC
  Administered 2016-05-25: 25 mg via INTRAVENOUS
  Filled 2016-05-25: qty 1

## 2016-05-25 MED ORDER — SODIUM CHLORIDE 0.9 % IV SOLN
Freq: Once | INTRAVENOUS | Status: AC
  Administered 2016-05-25: 10:00:00 via INTRAVENOUS
  Filled 2016-05-25: qty 1000

## 2016-05-25 NOTE — Progress Notes (Signed)
ANC 1.3.  [place] with treatment today

## 2016-05-25 NOTE — Progress Notes (Signed)
States is feeling well. Continues to feel fatigued.

## 2016-06-01 ENCOUNTER — Inpatient Hospital Stay: Payer: Medicare Other

## 2016-06-01 ENCOUNTER — Inpatient Hospital Stay: Payer: Medicare Other | Attending: Oncology

## 2016-06-01 VITALS — BP 116/69 | HR 86 | Resp 18

## 2016-06-01 DIAGNOSIS — Z171 Estrogen receptor negative status [ER-]: Principal | ICD-10-CM

## 2016-06-01 DIAGNOSIS — C50212 Malignant neoplasm of upper-inner quadrant of left female breast: Secondary | ICD-10-CM

## 2016-06-01 DIAGNOSIS — Z5111 Encounter for antineoplastic chemotherapy: Secondary | ICD-10-CM | POA: Diagnosis not present

## 2016-06-01 LAB — COMPREHENSIVE METABOLIC PANEL
ALBUMIN: 4 g/dL (ref 3.5–5.0)
ALK PHOS: 105 U/L (ref 38–126)
ALT: 24 U/L (ref 14–54)
ANION GAP: 8 (ref 5–15)
AST: 30 U/L (ref 15–41)
BUN: 13 mg/dL (ref 6–20)
CALCIUM: 9 mg/dL (ref 8.9–10.3)
CHLORIDE: 105 mmol/L (ref 101–111)
CO2: 23 mmol/L (ref 22–32)
Creatinine, Ser: 0.58 mg/dL (ref 0.44–1.00)
GFR calc Af Amer: >60 mL/min (ref >60)
GFR calc non Af Amer: >60 mL/min (ref >60)
GLUCOSE: 139 mg/dL — AB (ref 65–99)
Potassium: 3.2 mmol/L — ABNORMAL LOW (ref 3.5–5.1)
SODIUM: 136 mmol/L (ref 135–145)
Total Bilirubin: 0.6 mg/dL (ref 0.3–1.2)
Total Protein: 6.6 g/dL (ref 6.5–8.1)

## 2016-06-01 LAB — CBC WITH DIFFERENTIAL/PLATELET
BASOS PCT: 1 %
Basophils Absolute: 0 10*3/uL (ref 0–0.1)
EOS ABS: 0.1 10*3/uL (ref 0–0.7)
Eosinophils Relative: 3 %
HCT: 23.3 % — ABNORMAL LOW (ref 35.0–47.0)
HEMOGLOBIN: 8.1 g/dL — AB (ref 12.0–16.0)
Lymphocytes Relative: 18 %
Lymphs Abs: 0.5 10*3/uL — ABNORMAL LOW (ref 1.0–3.6)
MCH: 33.9 pg (ref 26.0–34.0)
MCHC: 34.7 g/dL (ref 32.0–36.0)
MCV: 97.6 fL (ref 80.0–100.0)
Monocytes Absolute: 0.3 10*3/uL (ref 0.2–0.9)
Monocytes Relative: 10 %
NEUTROS PCT: 68 %
Neutro Abs: 1.7 10*3/uL (ref 1.4–6.5)
PLATELETS: 182 10*3/uL (ref 150–440)
RBC: 2.38 MIL/uL — AB (ref 3.80–5.20)
RDW: 20.2 % — ABNORMAL HIGH (ref 11.5–14.5)
WBC: 2.5 10*3/uL — AB (ref 3.6–11.0)

## 2016-06-01 LAB — SAMPLE TO BLOOD BANK

## 2016-06-01 LAB — LACTATE DEHYDROGENASE: LDH: 247 U/L — AB (ref 98–192)

## 2016-06-01 MED ORDER — DIPHENHYDRAMINE HCL 50 MG/ML IJ SOLN
25.0000 mg | Freq: Once | INTRAMUSCULAR | Status: AC
  Administered 2016-06-01: 25 mg via INTRAVENOUS
  Filled 2016-06-01: qty 1

## 2016-06-01 MED ORDER — SODIUM CHLORIDE 0.9 % IV SOLN: 10.0000 mg | Freq: Once | INTRAVENOUS | Status: DC

## 2016-06-01 MED ORDER — FAMOTIDINE IN NACL 20-0.9 MG/50ML-% IV SOLN
20.0000 mg | Freq: Once | INTRAVENOUS | Status: AC
  Administered 2016-06-01: 20 mg via INTRAVENOUS
  Filled 2016-06-01: qty 50

## 2016-06-01 MED ORDER — DEXAMETHASONE SODIUM PHOSPHATE 10 MG/ML IJ SOLN
10.0000 mg | Freq: Once | INTRAMUSCULAR | Status: AC
  Administered 2016-06-01: 10 mg via INTRAVENOUS
  Filled 2016-06-01: qty 1

## 2016-06-01 MED ORDER — HEPARIN SOD (PORK) LOCK FLUSH 100 UNIT/ML IV SOLN
500.0000 [IU] | Freq: Once | INTRAVENOUS | Status: AC
  Administered 2016-06-01: 500 [IU] via INTRAVENOUS
  Filled 2016-06-01: qty 5

## 2016-06-01 MED ORDER — SODIUM CHLORIDE 0.9 % IJ SOLN
10.0000 mL | Freq: Once | INTRAMUSCULAR | Status: AC
  Administered 2016-06-01: 10 mL via INTRAVENOUS
  Filled 2016-06-01: qty 10

## 2016-06-01 MED ORDER — SODIUM CHLORIDE 0.9 % IV SOLN
80.0000 mg/m2 | Freq: Once | INTRAVENOUS | Status: AC
  Administered 2016-06-01: 138 mg via INTRAVENOUS
  Filled 2016-06-01: qty 23

## 2016-06-01 MED ORDER — SODIUM CHLORIDE 0.9 % IV SOLN
Freq: Once | INTRAVENOUS | Status: AC
  Administered 2016-06-01: 12:00:00 via INTRAVENOUS
  Filled 2016-06-01: qty 1000

## 2016-06-02 LAB — HAPTOGLOBIN

## 2016-06-06 ENCOUNTER — Other Ambulatory Visit: Payer: Self-pay

## 2016-06-06 ENCOUNTER — Observation Stay
Admission: EM | Admit: 2016-06-06 | Discharge: 2016-06-07 | Disposition: A | Discharge status: Home / Self Care | Payer: Medicare Other | Attending: Internal Medicine | Admitting: Internal Medicine

## 2016-06-06 ENCOUNTER — Emergency Department: Payer: Medicare Other

## 2016-06-06 DIAGNOSIS — I10 Essential (primary) hypertension: Secondary | ICD-10-CM | POA: Diagnosis not present

## 2016-06-06 DIAGNOSIS — M810 Age-related osteoporosis without current pathological fracture: Secondary | ICD-10-CM | POA: Diagnosis not present

## 2016-06-06 DIAGNOSIS — C50212 Malignant neoplasm of upper-inner quadrant of left female breast: Secondary | ICD-10-CM | POA: Insufficient documentation

## 2016-06-06 DIAGNOSIS — Z8673 Personal history of transient ischemic attack (TIA), and cerebral infarction without residual deficits: Secondary | ICD-10-CM | POA: Insufficient documentation

## 2016-06-06 DIAGNOSIS — D6481 Anemia due to antineoplastic chemotherapy: Secondary | ICD-10-CM | POA: Insufficient documentation

## 2016-06-06 DIAGNOSIS — K219 Gastro-esophageal reflux disease without esophagitis: Secondary | ICD-10-CM | POA: Insufficient documentation

## 2016-06-06 DIAGNOSIS — Z79899 Other long term (current) drug therapy: Secondary | ICD-10-CM | POA: Diagnosis not present

## 2016-06-06 DIAGNOSIS — E039 Hypothyroidism, unspecified: Secondary | ICD-10-CM | POA: Insufficient documentation

## 2016-06-06 DIAGNOSIS — R0789 Other chest pain: Secondary | ICD-10-CM | POA: Diagnosis not present

## 2016-06-06 DIAGNOSIS — D701 Agranulocytosis secondary to cancer chemotherapy: Secondary | ICD-10-CM | POA: Diagnosis not present

## 2016-06-06 DIAGNOSIS — R079 Chest pain, unspecified: Secondary | ICD-10-CM

## 2016-06-06 DIAGNOSIS — E785 Hyperlipidemia, unspecified: Secondary | ICD-10-CM | POA: Diagnosis not present

## 2016-06-06 DIAGNOSIS — E7800 Pure hypercholesterolemia, unspecified: Secondary | ICD-10-CM | POA: Diagnosis not present

## 2016-06-06 LAB — CBC WITH DIFFERENTIAL/PLATELET
Basophils Absolute: 0 10*3/uL (ref 0–0.1)
Basophils Relative: 1 %
EOS ABS: 0 10*3/uL (ref 0–0.7)
Eosinophils Relative: 2 %
HCT: 25 % — ABNORMAL LOW (ref 35.0–47.0)
Hemoglobin: 8.6 g/dL — ABNORMAL LOW (ref 12.0–16.0)
LYMPHS ABS: 0.6 10*3/uL — AB (ref 1.0–3.6)
Lymphocytes Relative: 45 %
MCH: 33.9 pg (ref 26.0–34.0)
MCHC: 34.3 g/dL (ref 32.0–36.0)
MCV: 98.7 fL (ref 80.0–100.0)
MONO ABS: 0.1 10*3/uL — AB (ref 0.2–0.9)
MONOS PCT: 9 %
NEUTROS PCT: 43 %
Neutro Abs: 0.5 10*3/uL — ABNORMAL LOW (ref 1.4–6.5)
PLATELETS: 166 10*3/uL (ref 150–440)
RBC: 2.53 MIL/uL — AB (ref 3.80–5.20)
RDW: 19.5 % — ABNORMAL HIGH (ref 11.5–14.5)
WBC: 1.3 10*3/uL — CL (ref 3.6–11.0)

## 2016-06-06 LAB — COMPREHENSIVE METABOLIC PANEL
ALBUMIN: 3.8 g/dL (ref 3.5–5.0)
ALK PHOS: 95 U/L (ref 38–126)
ALT: 23 U/L (ref 14–54)
ANION GAP: 9 (ref 5–15)
AST: 31 U/L (ref 15–41)
BUN: 13 mg/dL (ref 6–20)
CHLORIDE: 104 mmol/L (ref 101–111)
CO2: 24 mmol/L (ref 22–32)
Calcium: 9.2 mg/dL (ref 8.9–10.3)
Creatinine, Ser: 0.59 mg/dL (ref 0.44–1.00)
GFR calc Af Amer: >60 mL/min (ref >60)
GFR calc non Af Amer: >60 mL/min (ref >60)
GLUCOSE: 174 mg/dL — AB (ref 65–99)
POTASSIUM: 3.3 mmol/L — AB (ref 3.5–5.1)
SODIUM: 137 mmol/L (ref 135–145)
Total Bilirubin: 1.4 mg/dL — ABNORMAL HIGH (ref 0.3–1.2)
Total Protein: 6.5 g/dL (ref 6.5–8.1)

## 2016-06-06 LAB — TROPONIN I
TROPONIN I: 0.09 ng/mL — AB (ref <0.03)
TROPONIN I: 0.11 ng/mL — AB (ref <0.03)
TROPONIN I: 0.12 ng/mL — AB (ref <0.03)

## 2016-06-06 LAB — BRAIN NATRIURETIC PEPTIDE: B NATRIURETIC PEPTIDE 5: 13 pg/mL (ref 0.0–100.0)

## 2016-06-06 LAB — FIBRIN DERIVATIVES D-DIMER (ARMC ONLY): Fibrin derivatives D-dimer (ARMC): 495 (ref 0–499)

## 2016-06-06 MED ORDER — ACETAMINOPHEN 650 MG RE SUPP: 650.0000 mg | Freq: Four times a day (QID) | RECTAL | Status: DC | PRN

## 2016-06-06 MED ORDER — VITAMIN D 1000 UNITS PO TABS
2000.0000 [IU] | ORAL_TABLET | Freq: Every evening | ORAL | Status: DC
  Administered 2016-06-06: 2000 [IU] via ORAL
  Filled 2016-06-06: qty 2

## 2016-06-06 MED ORDER — SODIUM CHLORIDE 0.9% FLUSH: 10.0000 mL | INTRAVENOUS | Status: DC | PRN

## 2016-06-06 MED ORDER — SODIUM CHLORIDE 0.9% FLUSH
10.0000 mL | Freq: Two times a day (BID) | INTRAVENOUS | Status: DC
  Administered 2016-06-06 – 2016-06-07 (×2): 10 mL

## 2016-06-06 MED ORDER — SODIUM CHLORIDE 0.9 % IV BOLUS (SEPSIS)
500.0000 mL | Freq: Once | INTRAVENOUS | Status: AC
  Administered 2016-06-06: 500 mL via INTRAVENOUS

## 2016-06-06 MED ORDER — ACETAMINOPHEN 325 MG PO TABS: 650.0000 mg | ORAL_TABLET | Freq: Four times a day (QID) | ORAL | Status: DC | PRN

## 2016-06-06 MED ORDER — SODIUM CHLORIDE 0.9% FLUSH
3.0000 mL | Freq: Two times a day (BID) | INTRAVENOUS | Status: DC
  Administered 2016-06-06: 3 mL via INTRAVENOUS

## 2016-06-06 MED ORDER — ASPIRIN EC 81 MG PO TBEC
81.0000 mg | DELAYED_RELEASE_TABLET | Freq: Every day | ORAL | Status: DC
  Administered 2016-06-07: 81 mg via ORAL
  Filled 2016-06-06: qty 1

## 2016-06-06 MED ORDER — ENOXAPARIN SODIUM 40 MG/0.4ML ~~LOC~~ SOLN
40.0000 mg | SUBCUTANEOUS | Status: DC
  Administered 2016-06-06: 40 mg via SUBCUTANEOUS
  Filled 2016-06-06: qty 0.4

## 2016-06-06 MED ORDER — MORPHINE SULFATE (PF) 4 MG/ML IV SOLN: 2.0000 mg | INTRAVENOUS | Status: DC | PRN

## 2016-06-06 MED ORDER — LEVOTHYROXINE SODIUM 50 MCG PO TABS: 50.0000 ug | ORAL_TABLET | Freq: Every day | ORAL | Status: DC

## 2016-06-06 MED ORDER — ATORVASTATIN CALCIUM 20 MG PO TABS
80.0000 mg | ORAL_TABLET | Freq: Every day | ORAL | Status: DC
  Administered 2016-06-06: 80 mg via ORAL
  Filled 2016-06-06: qty 4

## 2016-06-06 MED ORDER — ASPIRIN 81 MG PO CHEW
324.0000 mg | CHEWABLE_TABLET | Freq: Once | ORAL | Status: AC
  Administered 2016-06-06: 324 mg via ORAL
  Filled 2016-06-06: qty 4

## 2016-06-06 MED ORDER — NITROGLYCERIN 0.4 MG SL SUBL: 0.4000 mg | SUBLINGUAL_TABLET | SUBLINGUAL | Status: DC | PRN

## 2016-06-06 MED ORDER — OXYCODONE HCL 5 MG PO TABS: 5.0000 mg | ORAL_TABLET | ORAL | Status: DC | PRN

## 2016-06-06 MED ORDER — LEVOTHYROXINE SODIUM 50 MCG PO TABS
50.0000 ug | ORAL_TABLET | Freq: Every day | ORAL | Status: DC
  Administered 2016-06-07: 50 ug via ORAL
  Filled 2016-06-06: qty 1

## 2016-06-06 NOTE — ED Provider Notes (Addendum)
[facility] Emergency Department Provider Note  ____________________________________________   I have reviewed the triage vital signs and the nursing notes.   HISTORY  Chief Complaint Chest Pain    HPI Erica Levy is a 75 y.o. female who has a history of left-sided breast cancer status post mastectomy in July, she states she has had a generalized feeling of chest pressure with no radiation no shortness of breath no pleuritic component no other alleviating or aggravating symptoms as been present now for the last 3 days more or less. She states that it is a discomfort. Sometimes he gets worse and sometimes gets better but it never completely goes away. She cannot give any other alleviating or aggravating symptoms. The patient states that at this time she has minimal discomfort. She has not had this before. She is getting active chemotherapy. She denies fever or cough.    Past Medical History:  Diagnosis Date  . Cancer ([place])   . Generalized OA   . GERD (gastroesophageal reflux disease)   . Hypercholesteremia   . Hypertension   . Hypothyroid   . Migraine   . Osteoporosis   . Stroke ([place])   . TIA (transient ischemic attack)    July    Patient Active Problem List   Diagnosis Date Noted  . Breast cancer of upper-inner quadrant of left female breast ([place]) 01/06/2016  . CVA (cerebral infarction) 02/09/2015    Past Surgical History:  Procedure Laterality Date  . BREAST BIOPSY Left    bx/clip-neg  . BREAST BIOPSY Left 12/15/2015   path pending/us and stereo bx  . CATARACT EXTRACTION    . DILATION AND CURETTAGE OF UTERUS    . EYE SURGERY Right    Cataract Extraction with IOL  . PARTIAL MASTECTOMY WITH AXILLARY SENTINEL LYMPH NODE BIOPSY Left 01/13/2016   Procedure: PARTIAL MASTECTOMY;  Surgeon: Leonie Green, MD;  Location: [facility] ORS;  Service: General;  Laterality: Left;  . PORTACATH PLACEMENT Right 01/13/2016   Procedure: INSERTION  PORT-A-CATH;  Surgeon: Leonie Green, MD;  Location: [facility] ORS;  Service: General;  Laterality: Right;  . RETINAL DETACHMENT SURGERY Right    Dr. Starling Manns, [facility]  . TONSILLECTOMY      Prior to Admission medications   Medication Sig Start Date End Date Taking? Authorizing Provider  atorvastatin (LIPITOR) 80 MG tablet Take 1 tablet (80 mg total) by mouth daily at 6 PM. 02/10/15  Yes Loletha Grayer, MD  hydrochlorothiazide (HYDRODIURIL) 12.5 MG tablet Take 12.5 mg by mouth daily.  11/09/15 11/08/16 Yes Historical Provider, MD  levothyroxine (SYNTHROID, LEVOTHROID) 50 MCG tablet Take 1 tablet by mouth daily. 01/08/16  Yes Historical Provider, MD  potassium chloride (KLOR-CON) 20 MEQ packet Take 20 mEq by mouth 2 (two) times daily. 04/06/16  Yes Lloyd Huger, MD  Vitamin D, Ergocalciferol, (DRISDOL) 50000 UNITS CAPS capsule Take 2,000 Units by mouth every evening.    Yes Historical Provider, MD    Allergies Patient has no known allergies.  Family History  Problem Relation Age of Onset  . Hypertension Mother   . Osteoarthritis Mother   . Dementia Father   . Breast cancer Cousin 23    mat cousin    Social History Social History  Substance Use Topics  . Smoking status: Never Smoker  . Smokeless tobacco: Never Used  . Alcohol use No    Review of Systems Constitutional: No fever/chills Eyes: No visual changes. ENT: No sore throat. No stiff neck no  neck pain Cardiovascular: Positive chest pain. Respiratory: Denies shortness of breath. Gastrointestinal:   no vomiting.  No diarrhea.  No constipation. Genitourinary: Negative for dysuria. Musculoskeletal: Negative lower extremity swelling Skin: Negative for rash. Neurological: Negative for severe headaches, focal weakness or numbness. 10-point ROS otherwise negative.  ____________________________________________   PHYSICAL EXAM:  VITAL SIGNS: ED Triage Vitals  Enc Vitals Group     BP 06/06/16 1005 135/78     Pulse  Rate 06/06/16 1005 (!) 124     Resp 06/06/16 1005 20     Temp 06/06/16 1005 98.1 F (36.7 C)     Temp Source 06/06/16 1005 Oral     SpO2 06/06/16 1005 98 %     Weight 06/06/16 1006 143 lb (64.9 kg)     Height 06/06/16 1006 5\' 2"  (1.575 m)     Head Circumference --      Peak Flow --      Pain Score 06/06/16 1006 5     Pain Loc --      Pain Edu? --      Excl. in [place]? --     Constitutional: Alert and oriented. Patient is getting chemotherapy and looks it however she is nontoxic emergently Eyes: Conjunctivae are normal. PERRL. EOMI. Head: Atraumatic. Nose: No congestion/rhinnorhea. Mouth/Throat: Mucous membranes are moist.  Oropharynx non-erythematous. Neck: No stridor.   Nontender with no meningismus Cardiovascular: Tachycardia noted, regular rhythm. Grossly normal heart sounds.  Good peripheral circulation. Respiratory: Normal respiratory effort.  No retractions. Lungs CTAB. Abdominal: Soft and nontender. No distention. No guarding no rebound Back:  There is no focal tenderness or step off.  there is no midline tenderness there are no lesions noted. there is no CVA tenderness Musculoskeletal: No lower extremity tenderness, no upper extremity tenderness. No joint effusions, no DVT signs strong distal pulses no edema Neurologic:  Normal speech and language. No gross focal neurologic deficits are appreciated.  Skin:  Skin is warm, dry and intact. No rash noted. Psychiatric: Mood and affect are normal. Speech and behavior are normal.  ____________________________________________   LABS (all labs ordered are listed, but only abnormal results are displayed)  Labs Reviewed  CBC WITH DIFFERENTIAL/PLATELET - Abnormal; Notable for the following:       Result Value   WBC 1.3 (*)    RBC 2.53 (*)    Hemoglobin 8.6 (*)    HCT 25.0 (*)    RDW 19.5 (*)    Neutro Abs 0.5 (*)    Lymphs Abs 0.6 (*)    Monocytes Absolute 0.1 (*)    All other components within normal limits  TROPONIN I -  Abnormal; Notable for the following:    Troponin I 0.09 (*)    All other components within normal limits  COMPREHENSIVE METABOLIC PANEL - Abnormal; Notable for the following:    Potassium 3.3 (*)    Glucose, Bld 174 (*)    Total Bilirubin 1.4 (*)    All other components within normal limits  BRAIN NATRIURETIC PEPTIDE  FIBRIN DERIVATIVES D-DIMER ([facility] ONLY)   ____________________________________________  EKG  I personally interpreted any EKGs ordered by me or triage EKG shows normal sinus tach rate 132 normal axis no acute ST elevation or depression of acute ischemic changes ____________________________________________  RADIOLOGY  I reviewed any imaging ordered by me or triage that were performed during my shift and, if possible, patient and/or family made aware of any abnormal findings. ____________________________________________   PROCEDURES  Procedure(s) performed: None  Procedures  Critical Care performed: None  ____________________________________________   INITIAL IMPRESSION / ASSESSMENT AND PLAN / ED COURSE  Pertinent labs & imaging results that were available during my care of the patient were reviewed by me and considered in my medical decision making (see chart for details).  Patient with a very poorly described mild chest discomfort. After fluid her heart rate is down in the 90s. She was initially somewhat tachycardia blood pressure is reassuring. She has no pleuritic component to this or shortness of breath however, she does have a history of lung cancer and therefore is an increased risk for a PE. Given the atypical nature of her symptoms however I did order a d-dimer to start with. Patient's chest x-ray is reassuring no evidence of pneumonia or pneumothorax. Her troponin however 0.09 which is of some concern. Certainly could be that she has a demand ischemia as I suspect that she has been Depleted and tachycardic for some time given how she looked upon arrival.  However, we will check a d-dimer to see if CT scan is warranted. At this time she feels much better. We are giving her aspirin and she will require admission given her elevated troponin.  ----------------------------------------- 12:17 PM on 06/06/2016 -----------------------------------------  Patient's d-dimer is negative, patient feels much better after IV fluid heart rate is in the 90s now, she states that she feels like she has been very dehydrated after her chemotherapy. This certainly could be some element of demand ischemia but with chest pain and a positive troponin I think she would benefit from an observational period with ongoing hydration which the hospitalist agrees with.  Clinical Course    ____________________________________________   FINAL CLINICAL IMPRESSION(S) / ED DIAGNOSES  Final diagnoses:  None      This chart was dictated using voice recognition software.  Despite best efforts to proofread,  errors can occur which can change meaning.      Schuyler Amor, MD 06/06/16 [place], MD 06/06/16 [phone]

## 2016-06-06 NOTE — ED Triage Notes (Signed)
Pt c/o chest pain intermittent for the past couple of days. Denies N/V.Marland Kitchen Pt is a CA pt with weekly chemo treatments, last was this past Friday.

## 2016-06-06 NOTE — Care Management Obs Status (Signed)
[place] NOTIFICATION   Patient Details  Name: Erica Levy MRN: [id number] Date of Birth: 1940/10/13   Medicare Observation Status Notification Given:  Yes Spouse signed form    Beau Fanny, RN 06/06/2016, 12:28 PM

## 2016-06-06 NOTE — Progress Notes (Signed)
Dr. Lavetta Nielsen paged to inquire about regular diet, unsure why clear liquid diet currently ordered. Verbal order read back and verified for regular diet and to place patient on protective precautions due to WBC 1.3.

## 2016-06-06 NOTE — H&P (Signed)
[place] [facility] [place] NAME: Erica Levy    MR#:  [id number]  DATE OF BIRTH:  24-Jan-1941   DATE OF ADMISSION:  06/06/2016  PRIMARY CARE PHYSICIAN: WHITE, [name], NP   REQUESTING/REFERRING PHYSICIAN: McShane  CHIEF COMPLAINT:   Chief Complaint  Patient presents with  . Chest Pain    HISTORY OF PRESENT ILLNESS:  Erica Levy  is a 75 y.o. female with a known history of Stage IIa breast cancer status post masectomy on active chemotherapy (last chemotherapy 1 week ago) who is presenting with chest pain. She describes retrosternal chest pain, pressure in quality, 7/10 intensity without worsening or relieving factors. Retrosternal in location and radiation to the epigastric region no associated symptoms. She states this is not associated with activity and has been coming intermittently over the last 2-3 days. Emergency department course EKG within normal limits however noted mild elevation in troponin of 0.09  PAST MEDICAL HISTORY:   Past Medical History:  Diagnosis Date  . Cancer ([place])   . Generalized OA   . GERD (gastroesophageal reflux disease)   . Hypercholesteremia   . Hypertension   . Hypothyroid   . Migraine   . Osteoporosis   . Stroke ([place])   . TIA (transient ischemic attack)    July    PAST SURGICAL HISTORY:   Past Surgical History:  Procedure Laterality Date  . BREAST BIOPSY Left    bx/clip-neg  . BREAST BIOPSY Left 12/15/2015   path pending/us and stereo bx  . CATARACT EXTRACTION    . DILATION AND CURETTAGE OF UTERUS    . EYE SURGERY Right    Cataract Extraction with IOL  . PARTIAL MASTECTOMY WITH AXILLARY SENTINEL LYMPH NODE BIOPSY Left 01/13/2016   Procedure: PARTIAL MASTECTOMY;  Surgeon: Leonie Green, MD;  Location: [facility] ORS;  Service: General;  Laterality: Left;  . PORTACATH PLACEMENT Right 01/13/2016   Procedure: INSERTION PORT-A-CATH;  Surgeon: Leonie Green, MD;  Location: [facility] ORS;   Service: General;  Laterality: Right;  . RETINAL DETACHMENT SURGERY Right    Dr. Starling Manns, [facility]  . TONSILLECTOMY      SOCIAL HISTORY:   Social History  Substance Use Topics  . Smoking status: Never Smoker  . Smokeless tobacco: Never Used  . Alcohol use No    FAMILY HISTORY:   Family History  Problem Relation Age of Onset  . Hypertension Mother   . Osteoarthritis Mother   . Dementia Father   . Breast cancer Cousin 61    mat cousin    DRUG ALLERGIES:  No Known Allergies  REVIEW OF SYSTEMS:  REVIEW OF SYSTEMS:  CONSTITUTIONAL: Denies fevers, chills, fatigue, weakness.  EYES: Denies blurred vision, double vision, or eye pain.  EARS, NOSE, THROAT: Denies tinnitus, ear pain, hearing loss.  RESPIRATORY: denies cough, shortness of breath, wheezing  CARDIOVASCULAR: Positive chest pain, denies palpitations, edema.  GASTROINTESTINAL: Denies nausea, vomiting, diarrhea, abdominal pain.  GENITOURINARY: Denies dysuria, hematuria.  ENDOCRINE: Denies nocturia or thyroid problems. HEMATOLOGIC AND LYMPHATIC: Denies easy bruising or bleeding.  SKIN: Denies rash or lesions.  MUSCULOSKELETAL: Denies pain in neck, back, shoulder, knees, hips, or further arthritic symptoms.  NEUROLOGIC: Denies paralysis, paresthesias.  PSYCHIATRIC: Denies anxiety or depressive symptoms. Otherwise full review of systems performed by me is negative.   MEDICATIONS AT HOME:   Prior to Admission medications   Medication Sig Start Date End Date Taking? Authorizing Provider  atorvastatin (LIPITOR) 80 MG tablet Take  1 tablet (80 mg total) by mouth daily at 6 PM. 02/10/15  Yes Loletha Grayer, MD  hydrochlorothiazide (HYDRODIURIL) 12.5 MG tablet Take 12.5 mg by mouth daily.  11/09/15 11/08/16 Yes Historical Provider, MD  levothyroxine (SYNTHROID, LEVOTHROID) 50 MCG tablet Take 1 tablet by mouth daily. 01/08/16  Yes Historical Provider, MD  potassium chloride (KLOR-CON) 20 MEQ packet Take 20 mEq by mouth 2 (two)  times daily. 04/06/16  Yes Lloyd Huger, MD  Vitamin D, Ergocalciferol, (DRISDOL) 50000 UNITS CAPS capsule Take 2,000 Units by mouth every evening.    Yes Historical Provider, MD      VITAL SIGNS:  Blood pressure (!) 128/57, pulse 93, temperature 98.1 F (36.7 C), temperature source Oral, resp. rate 11, height 5\' 2"  (1.575 m), weight 64.9 kg (143 lb), SpO2 100 %.  PHYSICAL EXAMINATION:  VITAL SIGNS: Vitals:   06/06/16 1130 06/06/16 1200  BP: (!) 130/59 (!) 128/57  Pulse: 97 93  Resp: 15 11  Temp:     GENERAL:75 y.o.female currently in no acute distress.  HEAD: Normocephalic, atraumatic.  EYES: Pupils equal, round, reactive to light. Extraocular muscles intact. No scleral icterus.  MOUTH: Moist mucosal membrane. Dentition intact. No abscess noted.  EAR, NOSE, THROAT: Clear without exudates. No external lesions.  NECK: Supple. No thyromegaly. No nodules. No JVD.  PULMONARY: Clear to ascultation, without wheeze rails or rhonci. No use of accessory muscles, Good respiratory effort. good air entry bilaterally CHEST: Nontender to palpation. Right sided chest port accessed without surrounding erythema or drainage CARDIOVASCULAR: S1 and S2. Regular rate and rhythm. No murmurs, rubs, or gallops. No edema. Pedal pulses 2+ bilaterally.  GASTROINTESTINAL: Soft, nontender, nondistended. No masses. Positive bowel sounds. No hepatosplenomegaly.  MUSCULOSKELETAL: No swelling, clubbing, or edema. Range of motion full in all extremities.  NEUROLOGIC: Cranial nerves II through XII are intact. No gross focal neurological deficits. Sensation intact. Reflexes intact.  SKIN: No ulceration, lesions, rashes, or cyanosis. Skin warm and dry. Turgor intact.  PSYCHIATRIC: Mood, affect within normal limits. The patient is awake, alert and oriented x 3. Insight, judgment intact.    LABORATORY PANEL:   CBC  Recent Labs Lab 06/06/16 1010  WBC 1.3*  HGB 8.6*  HCT 25.0*  PLT 166    ------------------------------------------------------------------------------------------------------------------  Chemistries   Recent Labs Lab 06/06/16 1010  NA 137  K 3.3*  CL 104  CO2 24  GLUCOSE 174*  BUN 13  CREATININE 0.59  CALCIUM 9.2  AST 31  ALT 23  ALKPHOS 95  BILITOT 1.4*   ------------------------------------------------------------------------------------------------------------------  Cardiac Enzymes  Recent Labs Lab 06/06/16 1010  TROPONINI 0.09*   ------------------------------------------------------------------------------------------------------------------  RADIOLOGY:  Dg Chest 2 View  Result Date: 06/06/2016 CLINICAL DATA:  The chest pain. History of breast malignancy, CVA, nonsmoker. EXAM: CHEST  2 VIEW COMPARISON:  Portable chest x-ray of January 13, 2016 FINDINGS: The lungs are well-expanded. There is no focal infiltrate. No pulmonary parenchymal nodules or masses are observed. The heart and pulmonary vascularity are normal. There is calcification in the wall of the thoracic aorta. There is no pleural effusion or pneumothorax. The Port-A-Cath tip projects over the midportion of the SVC. The bony thorax exhibits no acute abnormality. IMPRESSION: There is no active cardiopulmonary disease. Aortic atherosclerosis. Electronically Signed   By: Shakia Sebastiano  [place] M.D.   On: 06/06/2016 11:01    EKG:   Orders placed or performed in visit on 06/06/16  . EKG 12-Lead  . EKG 12-Lead    IMPRESSION AND PLAN:  75 year old Caucasian female history of stage IIA breast cancer on active chemotherapy presenting with chest pain  1.Chest pain, central: Initiate aspirin and statin therapy, admitted to telemetry, trend cardiac enzymes 3,  if continued elevation will initiate heparin drip ,nitroglycerin when necessary, morphine when necessary, consult cardiology - unassigned  2. Stage II breast cancer with neutropenia: ANC around 500: Continue supportive measures to  monitor blood counts if becomes febrile treat for neutropenic fever, if required follows with Dr. Grayland Ormond of oncology will hold on consult for now  3. Hypothyroidism unspecified: Synthroid 4. Essential hypertension: Hydrochlorothiazide relatively low hold for now and continue to monitor 5. Hyperlipidemia : Lipitor      All the records are reviewed and case discussed with ED provider. Management plans discussed with the patient, family and they are in agreement.  CODE STATUS: Full  TOTAL TIME TAKING CARE OF THIS PATIENT: 33 minutes.    Kemiyah Tarazon,  Karenann Cai.D on 06/06/2016 at 12:28 PM  Between 7am to 6pm - Pager - [phone]  After 6pm: House Pager: - [phone]  [facility]  Office  [phone]  CC: Primary care physician; WHITE, [name], NP

## 2016-06-07 LAB — CBC
HEMATOCRIT: 21.1 % — AB (ref 35.0–47.0)
Hemoglobin: 7.2 g/dL — ABNORMAL LOW (ref 12.0–16.0)
MCH: 33.7 pg (ref 26.0–34.0)
MCHC: 34 g/dL (ref 32.0–36.0)
MCV: 99.1 fL (ref 80.0–100.0)
Platelets: 160 10*3/uL (ref 150–440)
RBC: 2.13 MIL/uL — ABNORMAL LOW (ref 3.80–5.20)
RDW: 19.1 % — AB (ref 11.5–14.5)
WBC: 1.1 10*3/uL — AB (ref 3.6–11.0)

## 2016-06-07 LAB — BASIC METABOLIC PANEL
Anion gap: 6 (ref 5–15)
BUN: 11 mg/dL (ref 6–20)
CHLORIDE: 106 mmol/L (ref 101–111)
CO2: 26 mmol/L (ref 22–32)
Calcium: 8.8 mg/dL — ABNORMAL LOW (ref 8.9–10.3)
Creatinine, Ser: 0.58 mg/dL (ref 0.44–1.00)
GFR calc Af Amer: >60 mL/min (ref >60)
GFR calc non Af Amer: >60 mL/min (ref >60)
GLUCOSE: 131 mg/dL — AB (ref 65–99)
POTASSIUM: 3.5 mmol/L (ref 3.5–5.1)
Sodium: 138 mmol/L (ref 135–145)

## 2016-06-07 LAB — TROPONIN I: Troponin I: 0.1 ng/mL (ref <0.03)

## 2016-06-07 MED ORDER — HEPARIN SOD (PORK) LOCK FLUSH 100 UNIT/ML IV SOLN
500.0000 [IU] | Freq: Once | INTRAVENOUS | Status: AC
  Administered 2016-06-07: 500 [IU] via INTRAVENOUS
  Filled 2016-06-07: qty 5

## 2016-06-07 NOTE — Progress Notes (Signed)
Patient discharged home per MD order. All discharge instructions given and all questions answered. 

## 2016-06-07 NOTE — Discharge Summary (Signed)
[place] [facility] [place] NAME: Erica Levy    MR#:  [id number]  [place] OF BIRTH:  1941/02/08  DATE OF ADMISSION:  06/06/2016   ADMITTING PHYSICIAN: Lytle Butte, MD  DATE OF DISCHARGE: 06/07/2016  PRIMARY CARE PHYSICIAN: WHITE, [name], NP   ADMISSION DIAGNOSIS:  Chest pain, unspecified type [R07.9] DISCHARGE DIAGNOSIS:  Active Problems:   Chest pain Atypical chest pain  neutropenia and anemia  SECONDARY DIAGNOSIS:   Past Medical History:  Diagnosis Date  . Cancer ([place])   . Generalized OA   . GERD (gastroesophageal reflux disease)   . Hypercholesteremia   . Hypertension   . Hypothyroid   . Migraine   . Osteoporosis   . Stroke ([place])   . TIA (transient ischemic attack)    July   HOSPITAL COURSE:   75 year old Caucasian female history of stage IIA breast cancer on active chemotherapy presenting with chest pain  1.Atypical hest pain, central: pt was given aspirin and statin therapy, cardiac enzymes 3 are stable, on nitroglycerin when necessary, morphine when necessary.  2. Stage II breast cancer with neutropenia and anemia due to chemotherapy:  WBC 1.1 and Hb down to 7.2. I discussed with Dr. Grayland Ormond, who agrees pt discharged to home and follow up in cancer center tomorrow.  3. Hypothyroidism unspecified: Synthroid 4. Essential hypertension: Hold hydrochlorothiazide due to low BP. 5. Hyperlipidemia : Lipitor  DISCHARGE CONDITIONS:  Stable, discharge to home today. CONSULTS OBTAINED:  Treatment Team:  Lytle Butte, MD Lloyd Huger, MD DRUG ALLERGIES:  No Known Allergies DISCHARGE MEDICATIONS:     Medication List    STOP taking these medications   hydrochlorothiazide 12.5 MG tablet Commonly known as:  HYDRODIURIL     TAKE these medications   atorvastatin 80 MG tablet Commonly known as:  LIPITOR Take 1 tablet (80 mg total) by mouth daily at 6 PM.   levothyroxine 50 MCG tablet Commonly  known as:  SYNTHROID, LEVOTHROID Take 1 tablet by mouth daily.   potassium chloride 20 MEQ packet Commonly known as:  KLOR-CON Take 20 mEq by mouth 2 (two) times daily.   Vitamin D (Ergocalciferol) 50000 units Caps capsule Commonly known as:  DRISDOL Take 2,000 Units by mouth every evening.        DISCHARGE INSTRUCTIONS:  See AVS.  If you experience worsening of your admission symptoms, develop shortness of breath, life threatening emergency, suicidal or homicidal thoughts you must seek medical attention immediately by calling 911 or calling your MD immediately  if symptoms less severe.  You Must read complete instructions/literature along with all the possible adverse reactions/side effects for all the Medicines you take and that have been prescribed to you. Take any new Medicines after you have completely understood and accpet all the possible adverse reactions/side effects.   Please note  You were cared for by a hospitalist during your hospital stay. If you have any questions about your discharge medications or the care you received while you were in the hospital after you are discharged, you can call the unit and asked to speak with the hospitalist on call if the hospitalist that took care of you is not available. Once you are discharged, your primary care physician will handle any further medical issues. Please note that NO REFILLS for any discharge medications will be authorized once you are discharged, as it is imperative that you return to your primary care physician (or establish a relationship with a  primary care physician if you do not have one) for your aftercare needs so that they can reassess your need for medications and monitor your lab values.    On the day of Discharge:  VITAL SIGNS:  Blood pressure (!) 129/53, pulse (!) 102, temperature 98.2 F (36.8 C), temperature source Oral, resp. rate 14, height 5\' 2"  (1.575 m), weight 138 lb (62.6 kg), SpO2 98 %. PHYSICAL  EXAMINATION:  GENERAL:  75 y.o.-year-old patient lying in the bed with no acute distress.  EYES: Pupils equal, round, reactive to light and accommodation. No scleral icterus. Extraocular muscles intact.  HEENT: Head atraumatic, normocephalic. Oropharynx and nasopharynx clear.  NECK:  Supple, no jugular venous distention. No thyroid enlargement, no tenderness.  LUNGS: Normal breath sounds bilaterally, no wheezing, rales,rhonchi or crepitation. No use of accessory muscles of respiration.  CARDIOVASCULAR: S1, S2 normal. No murmurs, rubs, or gallops.  ABDOMEN: Soft, non-tender, non-distended. Bowel sounds present. No organomegaly or mass.  EXTREMITIES: No pedal edema, cyanosis, or clubbing.  NEUROLOGIC: Cranial nerves II through XII are intact. Muscle strength 5/5 in all extremities. Sensation intact. Gait not checked.  PSYCHIATRIC: The patient is alert and oriented x 3.  SKIN: No obvious rash, lesion, or ulcer.  DATA REVIEW:   CBC  Recent Labs Lab 06/07/16 0410  WBC 1.1*  HGB 7.2*  HCT 21.1*  PLT 160    Chemistries   Recent Labs Lab 06/06/16 1010 06/07/16 0410  NA 137 138  K 3.3* 3.5  CL 104 106  CO2 24 26  GLUCOSE 174* 131*  BUN 13 11  CREATININE 0.59 0.58  CALCIUM 9.2 8.8*  AST 31  --   ALT 23  --   ALKPHOS 95  --   BILITOT 1.4*  --      Microbiology Results  No results found for this or any previous visit.  RADIOLOGY:  No results found.   Management plans discussed with the patient, family and they are in agreement.  CODE STATUS:     Code Status Orders        Start     Ordered   06/06/16 1218  Full code  Continuous     06/06/16 1218    Code Status History    Date Active Date Inactive Code Status Order ID Comments User Context   01/13/2016  4:52 PM 01/14/2016  4:12 PM Full Code [id number]  Leonie Green, MD Inpatient   02/09/2015  2:48 PM 02/10/2015  8:32 PM Full Code [id number]  Dustin Flock, MD Inpatient      TOTAL TIME TAKING CARE OF THIS  PATIENT: 46 minutes.    Demetrios Loll M.D on 06/07/2016 at 2:07 PM  Between 7am to 6pm - Pager - [phone]  After 6pm go to [email or web address] - Proofreader  [facility]  Office  [phone]  CC: Primary care physician; WHITE, [name], NP   Note: This dictation was prepared with Dragon dictation along with smaller phrase technology. Any transcriptional errors that result from this process are unintentional.

## 2016-06-08 ENCOUNTER — Inpatient Hospital Stay: Payer: Medicare Other

## 2016-06-08 DIAGNOSIS — C50212 Malignant neoplasm of upper-inner quadrant of left female breast: Secondary | ICD-10-CM | POA: Diagnosis not present

## 2016-06-08 DIAGNOSIS — Z171 Estrogen receptor negative status [ER-]: Secondary | ICD-10-CM

## 2016-06-08 LAB — COMPREHENSIVE METABOLIC PANEL
ALT: 23 U/L (ref 14–54)
AST: 31 U/L (ref 15–41)
Albumin: 3.9 g/dL (ref 3.5–5.0)
Alkaline Phosphatase: 108 U/L (ref 38–126)
Anion gap: 9 (ref 5–15)
BUN: 13 mg/dL (ref 6–20)
CHLORIDE: 106 mmol/L (ref 101–111)
CO2: 22 mmol/L (ref 22–32)
Calcium: 9 mg/dL (ref 8.9–10.3)
Creatinine, Ser: 0.81 mg/dL (ref 0.44–1.00)
Glucose, Bld: 195 mg/dL — ABNORMAL HIGH (ref 65–99)
POTASSIUM: 3.3 mmol/L — AB (ref 3.5–5.1)
SODIUM: 137 mmol/L (ref 135–145)
Total Bilirubin: 1 mg/dL (ref 0.3–1.2)
Total Protein: 6.7 g/dL (ref 6.5–8.1)

## 2016-06-08 LAB — CBC WITH DIFFERENTIAL/PLATELET
BASOS ABS: 0 10*3/uL (ref 0–0.1)
Basophils Relative: 2 %
EOS ABS: 0.1 10*3/uL (ref 0–0.7)
EOS PCT: 4 %
HCT: 23.8 % — ABNORMAL LOW (ref 35.0–47.0)
Hemoglobin: 8.1 g/dL — ABNORMAL LOW (ref 12.0–16.0)
LYMPHS PCT: 32 %
Lymphs Abs: 0.6 10*3/uL — ABNORMAL LOW (ref 1.0–3.6)
MCH: 33.8 pg (ref 26.0–34.0)
MCHC: 34.2 g/dL (ref 32.0–36.0)
MCV: 99 fL (ref 80.0–100.0)
MONO ABS: 0.2 10*3/uL (ref 0.2–0.9)
Monocytes Relative: 9 %
Neutro Abs: 1 10*3/uL — ABNORMAL LOW (ref 1.4–6.5)
Neutrophils Relative %: 53 %
PLATELETS: 179 10*3/uL (ref 150–440)
RBC: 2.4 MIL/uL — AB (ref 3.80–5.20)
RDW: 19.5 % — AB (ref 11.5–14.5)
WBC: 1.9 10*3/uL — AB (ref 3.6–11.0)

## 2016-06-08 LAB — SAMPLE TO BLOOD BANK

## 2016-06-08 MED ORDER — HEPARIN SOD (PORK) LOCK FLUSH 100 UNIT/ML IV SOLN
INTRAVENOUS | Status: AC
  Filled 2016-06-08: qty 5

## 2016-06-08 MED ORDER — HEPARIN SOD (PORK) LOCK FLUSH 100 UNIT/ML IV SOLN
500.0000 [IU] | Freq: Once | INTRAVENOUS | Status: AC | PRN
  Administered 2016-06-08: 500 [IU]

## 2016-06-08 NOTE — Progress Notes (Signed)
Spoke with Dr. Mike Gip regarding pt just released from hospital yesterday, see labs, no repeat troponin ordered today, pt to be discharged home and come back for tx next Friday in [place] to have labs drawn next Thursday in [place] , pt was to get 2nd taxol tx today

## 2016-06-14 ENCOUNTER — Telehealth: Payer: Self-pay | Admitting: *Deleted

## 2016-06-14 ENCOUNTER — Encounter: Payer: Self-pay | Admitting: *Deleted

## 2016-06-14 ENCOUNTER — Inpatient Hospital Stay: Payer: Medicare Other | Attending: Oncology

## 2016-06-14 DIAGNOSIS — Z79899 Other long term (current) drug therapy: Secondary | ICD-10-CM | POA: Diagnosis not present

## 2016-06-14 DIAGNOSIS — E7800 Pure hypercholesterolemia, unspecified: Secondary | ICD-10-CM | POA: Diagnosis not present

## 2016-06-14 DIAGNOSIS — Z8673 Personal history of transient ischemic attack (TIA), and cerebral infarction without residual deficits: Secondary | ICD-10-CM | POA: Insufficient documentation

## 2016-06-14 DIAGNOSIS — D72829 Elevated white blood cell count, unspecified: Secondary | ICD-10-CM | POA: Diagnosis not present

## 2016-06-14 DIAGNOSIS — D72819 Decreased white blood cell count, unspecified: Secondary | ICD-10-CM | POA: Diagnosis not present

## 2016-06-14 DIAGNOSIS — Z171 Estrogen receptor negative status [ER-]: Secondary | ICD-10-CM | POA: Insufficient documentation

## 2016-06-14 DIAGNOSIS — M1990 Unspecified osteoarthritis, unspecified site: Secondary | ICD-10-CM | POA: Diagnosis not present

## 2016-06-14 DIAGNOSIS — R9431 Abnormal electrocardiogram [ECG] [EKG]: Secondary | ICD-10-CM | POA: Insufficient documentation

## 2016-06-14 DIAGNOSIS — D0512 Intraductal carcinoma in situ of left breast: Secondary | ICD-10-CM | POA: Diagnosis not present

## 2016-06-14 DIAGNOSIS — Z8669 Personal history of other diseases of the nervous system and sense organs: Secondary | ICD-10-CM | POA: Insufficient documentation

## 2016-06-14 DIAGNOSIS — I10 Essential (primary) hypertension: Secondary | ICD-10-CM | POA: Diagnosis not present

## 2016-06-14 DIAGNOSIS — E876 Hypokalemia: Secondary | ICD-10-CM | POA: Diagnosis not present

## 2016-06-14 DIAGNOSIS — M818 Other osteoporosis without current pathological fracture: Secondary | ICD-10-CM | POA: Insufficient documentation

## 2016-06-14 DIAGNOSIS — I700 Atherosclerosis of aorta: Secondary | ICD-10-CM | POA: Insufficient documentation

## 2016-06-14 DIAGNOSIS — K219 Gastro-esophageal reflux disease without esophagitis: Secondary | ICD-10-CM | POA: Diagnosis not present

## 2016-06-14 DIAGNOSIS — E039 Hypothyroidism, unspecified: Secondary | ICD-10-CM | POA: Insufficient documentation

## 2016-06-14 DIAGNOSIS — R531 Weakness: Secondary | ICD-10-CM | POA: Insufficient documentation

## 2016-06-14 DIAGNOSIS — R5383 Other fatigue: Secondary | ICD-10-CM | POA: Diagnosis not present

## 2016-06-14 DIAGNOSIS — D6481 Anemia due to antineoplastic chemotherapy: Secondary | ICD-10-CM | POA: Insufficient documentation

## 2016-06-14 DIAGNOSIS — C50212 Malignant neoplasm of upper-inner quadrant of left female breast: Secondary | ICD-10-CM | POA: Diagnosis present

## 2016-06-14 LAB — COMPREHENSIVE METABOLIC PANEL
ALBUMIN: 3.9 g/dL (ref 3.5–5.0)
ALT: 17 U/L (ref 14–54)
AST: 21 U/L (ref 15–41)
Alkaline Phosphatase: 100 U/L (ref 38–126)
Anion gap: 8 (ref 5–15)
BUN: 13 mg/dL (ref 6–20)
CHLORIDE: 104 mmol/L (ref 101–111)
CO2: 24 mmol/L (ref 22–32)
CREATININE: 0.65 mg/dL (ref 0.44–1.00)
Calcium: 8.9 mg/dL (ref 8.9–10.3)
GFR calc Af Amer: >60 mL/min (ref >60)
GLUCOSE: 139 mg/dL — AB (ref 65–99)
POTASSIUM: 3.8 mmol/L (ref 3.5–5.1)
Sodium: 136 mmol/L (ref 135–145)
Total Bilirubin: 1.1 mg/dL (ref 0.3–1.2)
Total Protein: 6.6 g/dL (ref 6.5–8.1)

## 2016-06-14 LAB — CBC WITH DIFFERENTIAL/PLATELET
BASOS ABS: 0 10*3/uL (ref 0–0.1)
BASOS PCT: 1 %
EOS PCT: 2 %
Eosinophils Absolute: 0 10*3/uL (ref 0–0.7)
HEMATOCRIT: 28.4 % — AB (ref 35.0–47.0)
Hemoglobin: 9.3 g/dL — ABNORMAL LOW (ref 12.0–16.0)
LYMPHS PCT: 27 %
Lymphs Abs: 0.5 10*3/uL — ABNORMAL LOW (ref 1.0–3.6)
MCH: 32.6 pg (ref 26.0–34.0)
MCHC: 32.8 g/dL (ref 32.0–36.0)
MCV: 99.4 fL (ref 80.0–100.0)
Monocytes Absolute: 0.3 10*3/uL (ref 0.2–0.9)
Monocytes Relative: 15 %
NEUTROS ABS: 1.1 10*3/uL — AB (ref 1.4–6.5)
Neutrophils Relative %: 55 %
PLATELETS: 197 10*3/uL (ref 150–440)
RBC: 2.86 MIL/uL — AB (ref 3.80–5.20)
RDW: 18.1 % — AB (ref 11.5–14.5)
WBC: 1.9 10*3/uL — AB (ref 3.6–11.0)

## 2016-06-14 LAB — SAMPLE TO BLOOD BANK

## 2016-06-14 LAB — TROPONIN I: TROPONIN I: 0.05 ng/mL — AB (ref <0.03)

## 2016-06-14 NOTE — Telephone Encounter (Signed)
Received call at 1030 from [place], in [place] lab, with a critical Troponin level of 0.05. I called Hayley RN, Dr Gary Fleet nurse in [place] with critical lab. She will inform MD.

## 2016-06-14 NOTE — Progress Notes (Signed)
.  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  INTERVAL HISTORY: Patient returns to clinic today for further evaluation and consideration of cycle 8 of 12 of weekly Taxol. She missed one infusion last week secondary to admission to the hospital for atypical chest pain. Her troponins were elevated which was thought to be secondary to cardiac strain and not an MI. She had significant anemia and was given one unit of packed red blood cells. She continues to have persistent weakness and fatigue, but otherwise feels well. She denies any peripheral neuropathy or other neurologic complaints. She denies any recent fevers or illnesses. She has a good appetite and denies weight loss. She denies any pain. She denies any chest pain or shortness of breath. She denies any nausea, vomiting, constipation, or diarrhea. She has no urinary complaints. Patient offers no further specific complaints today.  REVIEW OF SYSTEMS:   Review of Systems  Constitutional: Positive for malaise/fatigue. Negative for fever and weight loss.  Respiratory: Negative.  Negative for cough and shortness of breath.   Cardiovascular: Negative.  Negative for chest pain.  Gastrointestinal: Negative.  Negative for abdominal pain.  Genitourinary: Negative.   Musculoskeletal: Negative.   Neurological: Positive for weakness.  Psychiatric/Behavioral: Negative.  The patient is not nervous/anxious.    As per HPI. Otherwise, a complete review of systems is negative.   PAST MEDICAL HISTORY: Past Medical History:  Diagnosis Date  . Cancer ([place])   . Generalized OA   . GERD (gastroesophageal reflux disease)   .  Hypercholesteremia   . Hypertension   . Hypothyroid   . Migraine   . Osteoporosis   . Stroke ([place])   . TIA (transient ischemic attack)    July    PAST SURGICAL HISTORY: Past Surgical History:  Procedure Laterality Date  . BREAST BIOPSY Left    bx/clip-neg  . BREAST BIOPSY Left 12/15/2015   path pending/us and stereo bx  . CATARACT EXTRACTION    . DILATION AND CURETTAGE OF UTERUS    . EYE SURGERY Right    Cataract Extraction with IOL  . PARTIAL MASTECTOMY WITH AXILLARY SENTINEL LYMPH NODE BIOPSY Left 01/13/2016   Procedure: PARTIAL MASTECTOMY;  Surgeon: Erica Green, MD;  Location: [facility] ORS;  Service: General;  Laterality: Left;  . PORTACATH PLACEMENT Right 01/13/2016   Procedure: INSERTION PORT-A-CATH;  Surgeon: Erica Green, MD;  Location: [facility] ORS;  Service: General;  Laterality: Right;  . RETINAL DETACHMENT SURGERY Right    Dr. Starling Levy, [facility]  . TONSILLECTOMY      FAMILY HISTORY Family History  Problem Relation Age of Onset  . Hypertension Mother   . Osteoarthritis Mother   . Dementia Father   . Breast cancer Cousin 63    mat cousin       ADVANCED DIRECTIVES:    HEALTH MAINTENANCE: Social History  Substance Use Topics  . Smoking status: Never Smoker  . Smokeless tobacco: Never Used  . Alcohol use No     Colonoscopy:  PAP:  Bone density:  Lipid panel:  No Known Allergies  Current Outpatient Prescriptions  Medication Sig Dispense Refill  . atorvastatin (LIPITOR) 80 MG tablet Take 1 tablet (80 mg total) by mouth daily at  6 PM. 30 tablet 0  . levothyroxine (SYNTHROID, LEVOTHROID) 50 MCG tablet Take 1 tablet by mouth daily.  0  . potassium chloride (KLOR-CON) 20 MEQ packet Take 20 mEq by mouth 2 (two) times daily. 60 packet 3  . Vitamin D, Ergocalciferol, (DRISDOL) 50000 UNITS CAPS capsule Take 2,000 Units by mouth every evening.      No current facility-administered medications for this visit.     OBJECTIVE: Vitals:   06/15/16 0917    BP: (!) 158/81  Pulse: (!) 112  Temp: 98 F (36.7 C)     Body mass index is 27.36 kg/m.    ECOG FS:0 - Asymptomatic  General: Well-developed, well-nourished, no acute distress. Eyes: Pink conjunctiva, anicteric sclera. Breasts: Patient requested exam be deferred today. Lungs: Clear to auscultation bilaterally. Heart: Regular rate and rhythm. No rubs, murmurs, or gallops. Abdomen: Soft, nontender, nondistended. No organomegaly noted, normoactive bowel sounds. Musculoskeletal: No edema, cyanosis, or clubbing. Neuro: Alert, answering all questions appropriately. Cranial nerves grossly intact. Skin: No rashes or petechiae noted. Psych: Normal affect.   LAB RESULTS:  Lab Results  Component Value Date   NA 136 06/14/2016   K 3.8 06/14/2016   CL 104 06/14/2016   CO2 24 06/14/2016   GLUCOSE 139 (H) 06/14/2016   BUN 13 06/14/2016   CREATININE 0.65 06/14/2016   CALCIUM 8.9 06/14/2016   PROT 6.6 06/14/2016   ALBUMIN 3.9 06/14/2016   AST 21 06/14/2016   ALT 17 06/14/2016   ALKPHOS 100 06/14/2016   BILITOT 1.1 06/14/2016   GFRNONAA >60 06/14/2016   GFRAA >60 06/14/2016    Lab Results  Component Value Date   WBC 1.9 (L) 06/14/2016   NEUTROABS 1.1 (L) 06/14/2016   HGB 9.3 (L) 06/14/2016   HCT 28.4 (L) 06/14/2016   MCV 99.4 06/14/2016   PLT 197 06/14/2016     STUDIES: Dg Chest 2 View  Result Date: 06/06/2016 CLINICAL DATA:  The chest pain. History of breast malignancy, CVA, nonsmoker. EXAM: CHEST  2 VIEW COMPARISON:  Portable chest x-ray of January 13, 2016 FINDINGS: The lungs are well-expanded. There is no focal infiltrate. No pulmonary parenchymal nodules or masses are observed. The heart and pulmonary vascularity are normal. There is calcification in the wall of the thoracic aorta. There is no pleural effusion or pneumothorax. The Port-A-Cath tip projects over the midportion of the SVC. The bony thorax exhibits no acute abnormality. IMPRESSION: There is no active  cardiopulmonary disease. Aortic atherosclerosis. Electronically Signed   By: Erica  Levy M.D.   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  PLAN:    1. 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 Pretreatment MUGA was 64.5%.  Patient completed 4 cycles of adjuvant Adriamycin and Cytoxan. Because patient had modified mastectomy, she will not require adjuvant XRT. Although will further discuss with radiation oncology at the conclusion of her chemotherapy given her 2 positive axillary lymph nodes. An aromatase inhibitor would offer no benefit given the triple negative status of her disease. Proceed with cycle 8 of 12 of weekly Taxol today. Because of patient's relative neutropenia will give a 1 time dose of OnPro Neulasta today. Because of the Thanksgiving holiday and Neulasta treatment will skip  next week and return to clinic in 2 weeks for consideration of cycle 9.  2.  Hypertension: Patient's blood pressure is elevated today. Monitor. Continue current medications. 3.  Anemia: Secondary chemotherapy, monitor. Patient's hemoglobin is decreased but improved since receiving transfusion last week in the hospital. Monitor.  4.  Hypokalemia: Patient's potassium is now within normal limits, continue oral supplementation. 5.  Leukopenia: Slightly worse today. Neulasta as above. 6.  Elevated bilirubin: Bilirubin is now within normal limits. 7.  Elevated troponin: Likely secondary to cardiac strain, trending down. Follow-up with cardiology as recommended.  Patient expressed understanding and was in agreement with this plan. She also understands that She can call clinic at any time with any questions,  concerns, or complaints.   Breast cancer of upper-inner quadrant of left female breast [facility])   Staging form: Breast, AJCC 7th Edition     Pathologic stage from 01/06/2016: Stage IIA (T1c, N1a, M0) - Signed by Lloyd Huger, MD on 01/06/2016   Lloyd Huger, MD   06/16/2016 7:15 AM

## 2016-06-14 NOTE — Patient Instructions (Signed)
Dr. Grayland Ormond aware of elevated troponin of 0.05. MD will follow up with pt at scheduled appt on 06/15/16.

## 2016-06-15 ENCOUNTER — Other Ambulatory Visit: Payer: Medicare Other

## 2016-06-15 ENCOUNTER — Inpatient Hospital Stay (HOSPITAL_BASED_OUTPATIENT_CLINIC_OR_DEPARTMENT_OTHER): Payer: Medicare Other | Admitting: Oncology

## 2016-06-15 ENCOUNTER — Inpatient Hospital Stay: Payer: Medicare Other

## 2016-06-15 VITALS — BP 158/81 | HR 112 | Temp 98.0°F | Wt 149.6 lb

## 2016-06-15 VITALS — BP 130/76 | HR 100 | Temp 97.8°F | Resp 16

## 2016-06-15 DIAGNOSIS — D6481 Anemia due to antineoplastic chemotherapy: Secondary | ICD-10-CM

## 2016-06-15 DIAGNOSIS — M1990 Unspecified osteoarthritis, unspecified site: Secondary | ICD-10-CM

## 2016-06-15 DIAGNOSIS — C50212 Malignant neoplasm of upper-inner quadrant of left female breast: Secondary | ICD-10-CM

## 2016-06-15 DIAGNOSIS — D72819 Decreased white blood cell count, unspecified: Secondary | ICD-10-CM

## 2016-06-15 DIAGNOSIS — I700 Atherosclerosis of aorta: Secondary | ICD-10-CM

## 2016-06-15 DIAGNOSIS — Z8669 Personal history of other diseases of the nervous system and sense organs: Secondary | ICD-10-CM

## 2016-06-15 DIAGNOSIS — Z8673 Personal history of transient ischemic attack (TIA), and cerebral infarction without residual deficits: Secondary | ICD-10-CM

## 2016-06-15 DIAGNOSIS — Z171 Estrogen receptor negative status [ER-]: Secondary | ICD-10-CM | POA: Diagnosis not present

## 2016-06-15 DIAGNOSIS — E7800 Pure hypercholesterolemia, unspecified: Secondary | ICD-10-CM

## 2016-06-15 DIAGNOSIS — D0512 Intraductal carcinoma in situ of left breast: Secondary | ICD-10-CM

## 2016-06-15 DIAGNOSIS — I10 Essential (primary) hypertension: Secondary | ICD-10-CM

## 2016-06-15 DIAGNOSIS — Z79899 Other long term (current) drug therapy: Secondary | ICD-10-CM

## 2016-06-15 DIAGNOSIS — R5383 Other fatigue: Secondary | ICD-10-CM

## 2016-06-15 DIAGNOSIS — E039 Hypothyroidism, unspecified: Secondary | ICD-10-CM

## 2016-06-15 DIAGNOSIS — R9431 Abnormal electrocardiogram [ECG] [EKG]: Secondary | ICD-10-CM

## 2016-06-15 DIAGNOSIS — K219 Gastro-esophageal reflux disease without esophagitis: Secondary | ICD-10-CM

## 2016-06-15 DIAGNOSIS — M818 Other osteoporosis without current pathological fracture: Secondary | ICD-10-CM

## 2016-06-15 DIAGNOSIS — R531 Weakness: Secondary | ICD-10-CM

## 2016-06-15 DIAGNOSIS — E876 Hypokalemia: Secondary | ICD-10-CM

## 2016-06-15 MED ORDER — SODIUM CHLORIDE 0.9% FLUSH
10.0000 mL | INTRAVENOUS | Status: DC | PRN
  Administered 2016-06-15: 10 mL
  Filled 2016-06-15: qty 10

## 2016-06-15 MED ORDER — FAMOTIDINE IN NACL 20-0.9 MG/50ML-% IV SOLN
20.0000 mg | Freq: Once | INTRAVENOUS | Status: AC
  Administered 2016-06-15: 20 mg via INTRAVENOUS
  Filled 2016-06-15: qty 50

## 2016-06-15 MED ORDER — DIPHENHYDRAMINE HCL 50 MG/ML IJ SOLN
25.0000 mg | Freq: Once | INTRAMUSCULAR | Status: AC
  Administered 2016-06-15: 25 mg via INTRAVENOUS
  Filled 2016-06-15: qty 1

## 2016-06-15 MED ORDER — SODIUM CHLORIDE 0.9 % IV SOLN: 10.0000 mg | Freq: Once | INTRAVENOUS | Status: DC

## 2016-06-15 MED ORDER — DEXAMETHASONE SODIUM PHOSPHATE 10 MG/ML IJ SOLN
10.0000 mg | Freq: Once | INTRAMUSCULAR | Status: AC
Start: 2016-06-15 — End: 2016-06-15
  Administered 2016-06-15: 10 mg via INTRAVENOUS
  Filled 2016-06-15: qty 1

## 2016-06-15 MED ORDER — PEGFILGRASTIM 6 MG/0.6ML ~~LOC~~ PSKT
6.0000 mg | PREFILLED_SYRINGE | Freq: Once | SUBCUTANEOUS | Status: AC
  Administered 2016-06-15: 6 mg via SUBCUTANEOUS

## 2016-06-15 MED ORDER — PACLITAXEL CHEMO INJECTION 300 MG/50ML
80.0000 mg/m2 | Freq: Once | INTRAVENOUS | Status: AC
  Administered 2016-06-15: 138 mg via INTRAVENOUS
  Filled 2016-06-15: qty 23

## 2016-06-15 MED ORDER — SODIUM CHLORIDE 0.9 % IV SOLN
Freq: Once | INTRAVENOUS | Status: AC
  Administered 2016-06-15: 10:00:00 via INTRAVENOUS
  Filled 2016-06-15: qty 1000

## 2016-06-15 MED ORDER — HEPARIN SOD (PORK) LOCK FLUSH 100 UNIT/ML IV SOLN
500.0000 [IU] | Freq: Once | INTRAVENOUS | Status: AC | PRN
  Administered 2016-06-15: 500 [IU]
  Filled 2016-06-15: qty 5

## 2016-06-15 NOTE — Progress Notes (Signed)
ANC =1.1.  MD wants to proceed with treatment today and add neulasta.

## 2016-06-17 ENCOUNTER — Other Ambulatory Visit: Payer: Self-pay

## 2016-06-17 ENCOUNTER — Emergency Department: Payer: Medicare Other

## 2016-06-17 ENCOUNTER — Encounter: Payer: Self-pay | Admitting: Emergency Medicine

## 2016-06-17 ENCOUNTER — Inpatient Hospital Stay
Admission: EM | Admit: 2016-06-17 | Discharge: 2016-06-22 | DRG: 445 | Disposition: A | Discharge status: Home / Self Care | Payer: Medicare Other | Attending: Internal Medicine | Admitting: Internal Medicine

## 2016-06-17 DIAGNOSIS — Z8673 Personal history of transient ischemic attack (TIA), and cerebral infarction without residual deficits: Secondary | ICD-10-CM | POA: Diagnosis not present

## 2016-06-17 DIAGNOSIS — Z66 Do not resuscitate: Secondary | ICD-10-CM | POA: Diagnosis present

## 2016-06-17 DIAGNOSIS — C50212 Malignant neoplasm of upper-inner quadrant of left female breast: Secondary | ICD-10-CM | POA: Diagnosis present

## 2016-06-17 DIAGNOSIS — Z9841 Cataract extraction status, right eye: Secondary | ICD-10-CM

## 2016-06-17 DIAGNOSIS — Z961 Presence of intraocular lens: Secondary | ICD-10-CM | POA: Diagnosis present

## 2016-06-17 DIAGNOSIS — K219 Gastro-esophageal reflux disease without esophagitis: Secondary | ICD-10-CM | POA: Diagnosis present

## 2016-06-17 DIAGNOSIS — Z8249 Family history of ischemic heart disease and other diseases of the circulatory system: Secondary | ICD-10-CM | POA: Diagnosis not present

## 2016-06-17 DIAGNOSIS — E7800 Pure hypercholesterolemia, unspecified: Secondary | ICD-10-CM | POA: Diagnosis present

## 2016-06-17 DIAGNOSIS — I248 Other forms of acute ischemic heart disease: Secondary | ICD-10-CM | POA: Diagnosis present

## 2016-06-17 DIAGNOSIS — E039 Hypothyroidism, unspecified: Secondary | ICD-10-CM | POA: Diagnosis present

## 2016-06-17 DIAGNOSIS — Z9221 Personal history of antineoplastic chemotherapy: Secondary | ICD-10-CM | POA: Diagnosis not present

## 2016-06-17 DIAGNOSIS — R778 Other specified abnormalities of plasma proteins: Secondary | ICD-10-CM

## 2016-06-17 DIAGNOSIS — D6481 Anemia due to antineoplastic chemotherapy: Secondary | ICD-10-CM | POA: Diagnosis present

## 2016-06-17 DIAGNOSIS — K819 Cholecystitis, unspecified: Secondary | ICD-10-CM | POA: Diagnosis present

## 2016-06-17 DIAGNOSIS — I10 Essential (primary) hypertension: Secondary | ICD-10-CM | POA: Diagnosis present

## 2016-06-17 DIAGNOSIS — K810 Acute cholecystitis: Secondary | ICD-10-CM | POA: Diagnosis present

## 2016-06-17 DIAGNOSIS — Z803 Family history of malignant neoplasm of breast: Secondary | ICD-10-CM

## 2016-06-17 DIAGNOSIS — E876 Hypokalemia: Secondary | ICD-10-CM | POA: Diagnosis present

## 2016-06-17 DIAGNOSIS — R1011 Right upper quadrant pain: Secondary | ICD-10-CM

## 2016-06-17 DIAGNOSIS — K8042 Calculus of bile duct with acute cholecystitis without obstruction: Principal | ICD-10-CM | POA: Diagnosis present

## 2016-06-17 DIAGNOSIS — R1012 Left upper quadrant pain: Secondary | ICD-10-CM

## 2016-06-17 DIAGNOSIS — K8070 Calculus of gallbladder and bile duct without cholecystitis without obstruction: Secondary | ICD-10-CM | POA: Diagnosis not present

## 2016-06-17 DIAGNOSIS — M810 Age-related osteoporosis without current pathological fracture: Secondary | ICD-10-CM | POA: Diagnosis present

## 2016-06-17 DIAGNOSIS — R1013 Epigastric pain: Secondary | ICD-10-CM

## 2016-06-17 DIAGNOSIS — R7989 Other specified abnormal findings of blood chemistry: Secondary | ICD-10-CM

## 2016-06-17 HISTORY — DX: Anemia, unspecified: D64.9

## 2016-06-17 HISTORY — DX: Anemia due to antineoplastic chemotherapy: D64.81

## 2016-06-17 HISTORY — DX: Anemia due to antineoplastic chemotherapy: T45.1X5A

## 2016-06-17 LAB — DIFFERENTIAL
Basophils Absolute: 0 10*3/uL (ref 0–0.1)
Basophils Relative: 0 %
EOS PCT: 0 %
Eosinophils Absolute: 0 10*3/uL (ref 0–0.7)
LYMPHS ABS: 0.8 10*3/uL — AB (ref 1.0–3.6)
LYMPHS PCT: 7 %
MONO ABS: 0.3 10*3/uL (ref 0.2–0.9)
MONOS PCT: 2 %
Neutro Abs: 10.8 10*3/uL — ABNORMAL HIGH (ref 1.4–6.5)
Neutrophils Relative %: 91 %

## 2016-06-17 LAB — COMPREHENSIVE METABOLIC PANEL
ALBUMIN: 4.2 g/dL (ref 3.5–5.0)
ALK PHOS: 113 U/L (ref 38–126)
ALT: 20 U/L (ref 14–54)
ANION GAP: 9 (ref 5–15)
AST: 27 U/L (ref 15–41)
BILIRUBIN TOTAL: 1.8 mg/dL — AB (ref 0.3–1.2)
BUN: 16 mg/dL (ref 6–20)
CALCIUM: 9.2 mg/dL (ref 8.9–10.3)
CO2: 25 mmol/L (ref 22–32)
Chloride: 103 mmol/L (ref 101–111)
Creatinine, Ser: 0.62 mg/dL (ref 0.44–1.00)
GFR calc Af Amer: >60 mL/min (ref >60)
GFR calc non Af Amer: >60 mL/min (ref >60)
GLUCOSE: 141 mg/dL — AB (ref 65–99)
Potassium: 3.6 mmol/L (ref 3.5–5.1)
SODIUM: 137 mmol/L (ref 135–145)
TOTAL PROTEIN: 6.9 g/dL (ref 6.5–8.1)

## 2016-06-17 LAB — URINALYSIS COMPLETE WITH MICROSCOPIC (ARMC ONLY)
Bilirubin Urine: NEGATIVE
Glucose, UA: NEGATIVE mg/dL
HGB URINE DIPSTICK: NEGATIVE
KETONES UR: NEGATIVE mg/dL
LEUKOCYTES UA: NEGATIVE
NITRITE: NEGATIVE
PH: 6 (ref 5.0–8.0)
PROTEIN: NEGATIVE mg/dL
SPECIFIC GRAVITY, URINE: 1.003 — AB (ref 1.005–1.030)

## 2016-06-17 LAB — CBC
HCT: 31.5 % — ABNORMAL LOW (ref 35.0–47.0)
Hemoglobin: 10.8 g/dL — ABNORMAL LOW (ref 12.0–16.0)
MCH: 34.2 pg — ABNORMAL HIGH (ref 26.0–34.0)
MCHC: 34.2 g/dL (ref 32.0–36.0)
MCV: 100 fL (ref 80.0–100.0)
PLATELETS: 180 10*3/uL (ref 150–440)
RBC: 3.15 MIL/uL — ABNORMAL LOW (ref 3.80–5.20)
RDW: 17.8 % — ABNORMAL HIGH (ref 11.5–14.5)
WBC: 12 10*3/uL — ABNORMAL HIGH (ref 3.6–11.0)

## 2016-06-17 LAB — LIPASE, BLOOD: Lipase: 16 U/L (ref 11–51)

## 2016-06-17 LAB — LACTIC ACID, PLASMA
LACTIC ACID, VENOUS: 2.1 mmol/L — AB (ref 0.5–1.9)
Lactic Acid, Venous: 2.3 mmol/L (ref 0.5–1.9)

## 2016-06-17 LAB — TROPONIN I
TROPONIN I: 0.05 ng/mL — AB (ref <0.03)
Troponin I: 0.04 ng/mL (ref <0.03)

## 2016-06-17 MED ORDER — BISACODYL 10 MG RE SUPP: 10.0000 mg | Freq: Every day | RECTAL | Status: DC | PRN

## 2016-06-17 MED ORDER — CEFTRIAXONE SODIUM-DEXTROSE 1-3.74 GM-% IV SOLR
1.0000 g | Freq: Once | INTRAVENOUS | Status: AC
  Administered 2016-06-17: 1 g via INTRAVENOUS
  Filled 2016-06-17: qty 50

## 2016-06-17 MED ORDER — SODIUM CHLORIDE 0.9 % IV SOLN
INTRAVENOUS | Status: DC
  Administered 2016-06-17 – 2016-06-19 (×4): via INTRAVENOUS

## 2016-06-17 MED ORDER — ONDANSETRON HCL 4 MG/2ML IJ SOLN
4.0000 mg | Freq: Once | INTRAMUSCULAR | Status: AC
  Administered 2016-06-17: 4 mg via INTRAVENOUS
  Filled 2016-06-17: qty 2

## 2016-06-17 MED ORDER — ACETAMINOPHEN 325 MG PO TABS
650.0000 mg | ORAL_TABLET | Freq: Four times a day (QID) | ORAL | Status: DC | PRN
  Administered 2016-06-19 – 2016-06-20 (×2): 650 mg via ORAL
  Filled 2016-06-17 (×2): qty 2

## 2016-06-17 MED ORDER — IOPAMIDOL (ISOVUE-300) INJECTION 61%
100.0000 mL | Freq: Once | INTRAVENOUS | Status: AC | PRN
  Administered 2016-06-17: 100 mL via INTRAVENOUS

## 2016-06-17 MED ORDER — LEVOTHYROXINE SODIUM 50 MCG PO TABS
50.0000 ug | ORAL_TABLET | Freq: Every day | ORAL | Status: DC
  Administered 2016-06-19 – 2016-06-22 (×4): 50 ug via ORAL
  Filled 2016-06-17 (×4): qty 1

## 2016-06-17 MED ORDER — CEFTRIAXONE SODIUM-DEXTROSE 1-3.74 GM-% IV SOLR
1.0000 g | INTRAVENOUS | Status: DC
  Administered 2016-06-18 – 2016-06-21 (×4): 1 g via INTRAVENOUS
  Filled 2016-06-17 (×4): qty 50

## 2016-06-17 MED ORDER — MORPHINE SULFATE (PF) 4 MG/ML IV SOLN
2.0000 mg | INTRAVENOUS | Status: DC | PRN
  Administered 2016-06-17 – 2016-06-21 (×5): 2 mg via INTRAVENOUS
  Filled 2016-06-17 (×4): qty 1

## 2016-06-17 MED ORDER — ONDANSETRON HCL 4 MG PO TABS: 4.0000 mg | ORAL_TABLET | Freq: Four times a day (QID) | ORAL | Status: DC | PRN

## 2016-06-17 MED ORDER — IOPAMIDOL (ISOVUE-300) INJECTION 61%
30.0000 mL | Freq: Once | INTRAVENOUS | Status: AC | PRN
  Administered 2016-06-17: 30 mL via ORAL

## 2016-06-17 MED ORDER — MORPHINE SULFATE (PF) 4 MG/ML IV SOLN
4.0000 mg | Freq: Once | INTRAVENOUS | Status: AC
  Administered 2016-06-17: 4 mg via INTRAVENOUS
  Filled 2016-06-17: qty 1

## 2016-06-17 MED ORDER — HEPARIN SODIUM (PORCINE) 5000 UNIT/ML IJ SOLN
5000.0000 [IU] | Freq: Three times a day (TID) | INTRAMUSCULAR | Status: DC
  Administered 2016-06-17 – 2016-06-18 (×3): 5000 [IU] via SUBCUTANEOUS
  Filled 2016-06-17 (×3): qty 1

## 2016-06-17 MED ORDER — FAMOTIDINE IN NACL 20-0.9 MG/50ML-% IV SOLN
20.0000 mg | Freq: Two times a day (BID) | INTRAVENOUS | Status: DC
  Administered 2016-06-17: 21:00:00 20 mg via INTRAVENOUS
  Filled 2016-06-17 (×3): qty 50

## 2016-06-17 MED ORDER — DEXTROSE 5 % IV SOLN: 1.0000 g | Freq: Once | INTRAVENOUS | Status: DC

## 2016-06-17 MED ORDER — ATORVASTATIN CALCIUM 20 MG PO TABS
80.0000 mg | ORAL_TABLET | Freq: Every day | ORAL | Status: DC
  Administered 2016-06-17 – 2016-06-20 (×4): 80 mg via ORAL
  Filled 2016-06-17 (×4): qty 4

## 2016-06-17 MED ORDER — DOCUSATE SODIUM 100 MG PO CAPS
100.0000 mg | ORAL_CAPSULE | Freq: Two times a day (BID) | ORAL | Status: DC
  Administered 2016-06-17 – 2016-06-18 (×2): 100 mg via ORAL
  Filled 2016-06-17 (×2): qty 1

## 2016-06-17 MED ORDER — ONDANSETRON HCL 4 MG/2ML IJ SOLN: 4.0000 mg | Freq: Four times a day (QID) | INTRAMUSCULAR | Status: DC | PRN

## 2016-06-17 MED ORDER — SODIUM CHLORIDE 0.9 % IV BOLUS (SEPSIS)
1000.0000 mL | Freq: Once | INTRAVENOUS | Status: AC
  Administered 2016-06-17: 1000 mL via INTRAVENOUS

## 2016-06-17 MED ORDER — GI COCKTAIL ~~LOC~~
30.0000 mL | Freq: Once | ORAL | Status: AC
  Administered 2016-06-17: 30 mL via ORAL
  Filled 2016-06-17: qty 30

## 2016-06-17 MED ORDER — HYDROCODONE-ACETAMINOPHEN 5-325 MG PO TABS: 1.0000 | ORAL_TABLET | ORAL | Status: DC | PRN

## 2016-06-17 MED ORDER — POTASSIUM CHLORIDE CRYS ER 20 MEQ PO TBCR
20.0000 meq | EXTENDED_RELEASE_TABLET | Freq: Two times a day (BID) | ORAL | Status: DC
  Administered 2016-06-17 – 2016-06-18 (×2): 20 meq via ORAL
  Filled 2016-06-17 (×2): qty 1

## 2016-06-17 MED ORDER — MORPHINE SULFATE (PF) 2 MG/ML IV SOLN
INTRAVENOUS | Status: AC
  Filled 2016-06-17: qty 1

## 2016-06-17 MED ORDER — SODIUM CHLORIDE 0.9% FLUSH
3.0000 mL | Freq: Two times a day (BID) | INTRAVENOUS | Status: DC
  Administered 2016-06-17 – 2016-06-22 (×5): 3 mL via INTRAVENOUS

## 2016-06-17 MED ORDER — ACETAMINOPHEN 650 MG RE SUPP: 650.0000 mg | Freq: Four times a day (QID) | RECTAL | Status: DC | PRN

## 2016-06-17 MED ORDER — DEXTROSE 5 % IV SOLN: 1.0000 g | INTRAVENOUS | Status: DC

## 2016-06-17 NOTE — ED Notes (Signed)
Dr. Archie Balboa notified lactic acid 2.3

## 2016-06-17 NOTE — ED Triage Notes (Addendum)
C/o upper abdominal pain X 2-3 weeks. Denies NVD. Last bowel movement this morning. No other sx including fever. Breast cancer pt, receiving chemo.Reports gets easily out of breath with any exertion.

## 2016-06-17 NOTE — ED Notes (Signed)
Dr. Archie Balboa notified of troponin 0.04

## 2016-06-17 NOTE — Progress Notes (Signed)
Chaplain received a consult that the pt in room 112 request information on a Advanced Directive.    06/17/16 2049  Clinical Encounter Type  Visited With Patient and family together  Visit Type Initial;Spiritual support  Referral From Nurse  Consult/Referral To Chaplain  Spiritual Encounters  Spiritual Needs Other (Comment)

## 2016-06-17 NOTE — H&P (Signed)
History and Physical    Erica Levy [id number] DOB: [birth year] DOA: 06/17/2016  Referring physician: Dr. Archie Balboa PCP: WHITE, [name], NP  Specialists: Dr. Grayland Ormond  Chief Complaint: abdominal pain  HPI: Erica Levy is a 75 y.o. female has a past medical history significant for HTN, CVA, breast cancer on chemo and hypothyroidism now with 2-3 week hx of upper abdominal pain. No fever. Denies vomiting or diarrhea. Some constipation and nausea. In ER, pt noted to have cholecystitis on CT and [place]. She is now admitted. Last chemo was 2 days ago. Some anorexia. Denies CP or SOB. No cardiac hx.  Review of Systems: The patient denies  fever, weight loss,, vision loss, decreased hearing, hoarseness, chest pain, syncope, dyspnea on exertion, peripheral edema, balance deficits, hemoptysis, melena, hematochezia, severe indigestion/heartburn, hematuria, incontinence, genital sores, muscle weakness, suspicious skin lesions, transient blindness, difficulty walking, depression, unusual weight change, abnormal bleeding, enlarged lymph nodes, angioedema, and breast masses.   Past Medical History:  Diagnosis Date  . Cancer ([place])   . Generalized OA   . GERD (gastroesophageal reflux disease)   . Hypercholesteremia   . Hypertension   . Hypothyroid   . Migraine   . Osteoporosis   . Stroke ([place])   . TIA (transient ischemic attack)    July   Past Surgical History:  Procedure Laterality Date  . BREAST BIOPSY Left    bx/clip-neg  . BREAST BIOPSY Left 12/15/2015   path pending/us and stereo bx  . CATARACT EXTRACTION    . DILATION AND CURETTAGE OF UTERUS    . EYE SURGERY Right    Cataract Extraction with IOL  . PARTIAL MASTECTOMY WITH AXILLARY SENTINEL LYMPH NODE BIOPSY Left 01/13/2016   Procedure: PARTIAL MASTECTOMY;  Surgeon: Leonie Green, MD;  Location: [facility] ORS;  Service: General;  Laterality: Left;  . PORTACATH PLACEMENT Right 01/13/2016   Procedure: INSERTION  PORT-A-CATH;  Surgeon: Leonie Green, MD;  Location: [facility] ORS;  Service: General;  Laterality: Right;  . RETINAL DETACHMENT SURGERY Right    Dr. Starling Manns, [facility]  . TONSILLECTOMY     Social History:  reports that she has never smoked. She has never used smokeless tobacco. She reports that she does not drink alcohol or use drugs.  No Known Allergies  Family History  Problem Relation Age of Onset  . Hypertension Mother   . Osteoarthritis Mother   . Dementia Father   . Breast cancer Cousin 26    mat cousin    Prior to Admission medications   Medication Sig Start Date End Date Taking? Authorizing Provider  acetaminophen (TYLENOL) 500 MG tablet Take 500 mg by mouth every 6 (six) hours as needed.   Yes Historical Provider, MD  atorvastatin (LIPITOR) 80 MG tablet Take 1 tablet (80 mg total) by mouth daily at 6 PM. 02/10/15  Yes Loletha Grayer, MD  Cholecalciferol (VITAMIN D3) 5000 units CAPS Take 1 capsule by mouth every other day.   Yes Historical Provider, MD  levothyroxine (SYNTHROID, LEVOTHROID) 50 MCG tablet Take 1 tablet by mouth daily. 01/08/16  Yes Historical Provider, MD  potassium chloride SA (K-DUR,KLOR-CON) 20 MEQ tablet Take 20 mEq by mouth 2 (two) times daily.   Yes Historical Provider, MD  potassium chloride (KLOR-CON) 20 MEQ packet Take 20 mEq by mouth 2 (two) times daily. Patient not taking: Reported on 06/17/2016 04/06/16   Lloyd Huger, MD  Vitamin D, Ergocalciferol, (DRISDOL) 50000 UNITS CAPS capsule Take 2,000 Units by mouth every  evening.     Historical Provider, MD   Physical Exam: Vitals:   06/17/16 1301 06/17/16 1305 06/17/16 1500 06/17/16 1602  BP: 133/81   108/72  Pulse: (!) 147 (!) 130 (!) 103 (!) 106  Resp: 20  (!) 26 18  Temp: 97.8 F (36.6 C)     TempSrc: Oral     SpO2: 98%  100% 100%  Weight: 68 kg (150 lb)     Height: 5\' 2"  (1.575 m)        General:  No apparent distress, WDWN, [place]/AT  Eyes: PERRL, EOMI, no scleral icterus, conjunctiva  clear  ENT: moist oropharynx without exudate, TM's benign, dentition good  Neck: supple, no lymphadenopathy. No bruits or thyromegaly  Cardiovascular: regular rate without MRG; 2+ peripheral pulses, no JVD, no peripheral edema  Respiratory: CTA biL, good air movement without wheezing, rhonchi or crackled. Respiratory effort normal  Abdomen: soft, tender to palpation in the upper quadrants and epigastrum, hypoactive bowel sounds, no guarding, no rebound  Skin: no rashes or lesions  Musculoskeletal: normal bulk and tone, no joint swelling  Psychiatric: normal mood and affect, A&OX3  Neurologic: CN 2-12 grossly intact, Motor strength 5/5 in all 4 groups with symmetric DTR' s and non-focal sensory exam  Labs on Admission:  Basic Metabolic Panel:  Recent Labs Lab 06/14/16 0947 06/17/16 1329  NA 136 137  K 3.8 3.6  CL 104 103  CO2 24 25  GLUCOSE 139* 141*  BUN 13 16  CREATININE 0.65 0.62  CALCIUM 8.9 9.2   Liver Function Tests:  Recent Labs Lab 06/14/16 0947 06/17/16 1329  AST 21 27  ALT 17 20  ALKPHOS 100 113  BILITOT 1.1 1.8*  PROT 6.6 6.9  ALBUMIN 3.9 4.2    Recent Labs Lab 06/17/16 1329  LIPASE 16   No results for input(s): AMMONIA in the last 168 hours. CBC:  Recent Labs Lab 06/14/16 0947 06/17/16 1329  WBC 1.9* 12.0*  NEUTROABS 1.1* 10.8*  HGB 9.3* 10.8*  HCT 28.4* 31.5*  MCV 99.4 100.0  PLT 197 180   Cardiac Enzymes:  Recent Labs Lab 06/14/16 0947 06/17/16 1329  TROPONINI 0.05* 0.04*    BNP (last 3 results)  Recent Labs  06/06/16 1010  BNP 13.0    ProBNP (last 3 results) No results for input(s): PROBNP in the last 8760 hours.  CBG: No results for input(s): GLUCAP in the last 168 hours.  Radiological Exams on Admission: Ct Abdomen Pelvis W Contrast  Result Date: 06/17/2016 CLINICAL DATA:  75 year old female with abdominal pain since 0230 hours today. Initial encounter. Left breast cancer currently on chemotherapy. EXAM:  CT ABDOMEN AND PELVIS WITH CONTRAST TECHNIQUE: Multidetector CT imaging of the abdomen and pelvis was performed using the standard protocol following bolus administration of intravenous contrast. CONTRAST:  179mL ISOVUE-300 IOPAMIDOL (ISOVUE-300) INJECTION 61% COMPARISON:  CT Abdomen and Pelvis 04/2016. FINDINGS: Lower chest: Sequelae of left mastectomy again noted. No pericardial or pleural effusion. Negative lung bases; minor lower lobe atelectasis. No upper abdominal free air. Hepatobiliary: No liver mass. Mild intrahepatic biliary ductal dilatation is possible in the right lobe. The gallbladder is distended, 6 cm in diameter, and multiple gallstones are apparent. The CBD is at the upper limits of normal. The gallbladder wall is mildly indistinct, but there is no confluent pericholecystic inflammation. Pancreas: Negative.  No pancreatic ductal dilatation identified. Spleen: Negative. Adrenals/Urinary Tract: Negative adrenal glands. Bilateral renal enhancement and contrast excretion is normal. Unremarkable urinary bladder. Stomach/Bowel: Decompressed  distal colon. Decompressed left colon. Mild to moderate gas and retained stool throughout the transverse colon and right colon. Oral contrast has reached the ascending colon, nearly to the splenic flexure. Negative retrocecal appendix. Negative terminal ileum. No dilated small bowel. Decompressed stomach and duodenum. Vascular/Lymphatic: Extensive Aortoiliac calcified atherosclerosis noted. Major arterial structures are patent. Portal venous system is patent. No lymphadenopathy. Reproductive: Negative. Other: No pelvic free fluid. Musculoskeletal: No acute or suspicious osseous lesion. IMPRESSION: 1. Dilated gallbladder with gallstones and suspicion of mild biliary ductal enlargement. Consider acute cholecystitis or early acute biliary obstruction. Recommend follow-up Right Upper Quadrant Ultrasound. 2. No other acute or inflammatory process identified in the abdomen  or pelvis. No metastatic disease identified. 3.  Calcified aortic atherosclerosis. Electronically Signed   By: Genevie Ann M.D.   On: 06/17/2016 16:39   US Abdomen Limited Ruq  Result Date: 06/17/2016 CLINICAL DATA:  One day history of epigastric region pain EXAM: US ABDOMEN LIMITED - RIGHT UPPER QUADRANT COMPARISON:  CT abdomen and pelvis June 17, 2016 FINDINGS: Gallbladder: Gallbladder is distended. Multiple gallstones are noted in the gallbladder as well as mild sludge. Largest individual gallstone measures 8 mm in length. The gallbladder wall does not appear thickened, and there is no pericholecystic fluid. Gallbladder wall does appear subtly edematous, however. No sonographic Murphy sign noted by sonographer. Common bile duct: Diameter: 8 mm which is prominent. No biliary duct mass or calculus evident. Note that the distal common bile duct is obscured by gas. Liver: No focal lesion identified. Within normal limits in parenchymal echogenicity. IMPRESSION: Distended gallbladder with multiple gallstones and subtly edematous wall. These are findings concerning for a degree of acute cholecystitis. Common bile duct measures 8 mm which is prominent. No mass or calculus is seen in the biliary duct system. Note, however, that the distal common bile duct is obscured by gas. Electronically Signed   By: Lowella Grip III M.D.   On: 06/17/2016 17:37    EKG: Independently reviewed.  Assessment/Plan Principal Problem:   Cholecystitis, acute Active Problems:   Breast cancer of upper-inner quadrant of left female breast (HCC)   Bilateral upper abdominal pain   Elevated troponin   Will admit to floor with IV fluids and IV ABX. Zofran and Morphine IV as needed. Follow enzymes. Consult Surgery and Cardiology. Echo ordered. Repeat labs in AM.  Diet: clear liquid Fluids: NS@100  DVT Prophylaxis: SQ Heparin  Code Status: FULL  Family Communication: yes  Disposition Plan: home  Time spent: 55 min

## 2016-06-17 NOTE — ED Notes (Signed)
Patient transported to Ultrasound 

## 2016-06-17 NOTE — ED Provider Notes (Signed)
[facility] Emergency Department Provider Note   ____________________________________________   I have reviewed the triage vital signs and the nursing notes.   HISTORY  Chief Complaint Abdominal Pain   History limited by: Not Limited   HPI Erica Levy is a 75 y.o. female with history of breast cancer, undergoing chemotherapy, who presents to the emergency department because of concern for abdominal pain. The patient states it is located in the epigastric region. It feels like a knot. It started this morning, although the patient states that she has been having similar pain on and off for the past few weeks. She has had some nausea with the pain. No change in defecation. No fevers.   Past Medical History:  Diagnosis Date  . Cancer ([place])   . Generalized OA   . GERD (gastroesophageal reflux disease)   . Hypercholesteremia   . Hypertension   . Hypothyroid   . Migraine   . Osteoporosis   . Stroke ([place])   . TIA (transient ischemic attack)    July    Patient Active Problem List   Diagnosis Date Noted  . Chest pain 06/06/2016  . Breast cancer of upper-inner quadrant of left female breast ([place]) 01/06/2016  . CVA (cerebral infarction) 02/09/2015    Past Surgical History:  Procedure Laterality Date  . BREAST BIOPSY Left    bx/clip-neg  . BREAST BIOPSY Left 12/15/2015   path pending/us and stereo bx  . CATARACT EXTRACTION    . DILATION AND CURETTAGE OF UTERUS    . EYE SURGERY Right    Cataract Extraction with IOL  . PARTIAL MASTECTOMY WITH AXILLARY SENTINEL LYMPH NODE BIOPSY Left 01/13/2016   Procedure: PARTIAL MASTECTOMY;  Surgeon: Leonie Green, MD;  Location: [facility] ORS;  Service: General;  Laterality: Left;  . PORTACATH PLACEMENT Right 01/13/2016   Procedure: INSERTION PORT-A-CATH;  Surgeon: Leonie Green, MD;  Location: [facility] ORS;  Service: General;  Laterality: Right;  . RETINAL DETACHMENT SURGERY Right    Dr. Starling Manns, [facility] [place]   . TONSILLECTOMY      Prior to Admission medications   Medication Sig Start Date End Date Taking? Authorizing Provider  atorvastatin (LIPITOR) 80 MG tablet Take 1 tablet (80 mg total) by mouth daily at 6 PM. 02/10/15   Loletha Grayer, MD  levothyroxine (SYNTHROID, LEVOTHROID) 50 MCG tablet Take 1 tablet by mouth daily. 01/08/16   Historical Provider, MD  potassium chloride (KLOR-CON) 20 MEQ packet Take 20 mEq by mouth 2 (two) times daily. 04/06/16   Lloyd Huger, MD  Vitamin D, Ergocalciferol, (DRISDOL) 50000 UNITS CAPS capsule Take 2,000 Units by mouth every evening.     Historical Provider, MD    Allergies Patient has no known allergies.  Family History  Problem Relation Age of Onset  . Hypertension Mother   . Osteoarthritis Mother   . Dementia Father   . Breast cancer Cousin 18    mat cousin    Social History Social History  Substance Use Topics  . Smoking status: Never Smoker  . Smokeless tobacco: Never Used  . Alcohol use No    Review of Systems  Constitutional: Negative for fever. Cardiovascular: Negative for chest pain. Respiratory: Negative for shortness of breath. Gastrointestinal: Positive for epigastric pain. Genitourinary: Negative for dysuria. Musculoskeletal: Negative for back pain. Skin: Negative for rash. Neurological: Negative for headaches, focal weakness or numbness.  10-point ROS otherwise negative.  ____________________________________________   PHYSICAL EXAM:  VITAL SIGNS: ED Triage Vitals [06/17/16 1301]  Enc Vitals Group     BP 133/81     Pulse Rate (!) 147     Resp 20     Temp 97.8 F (36.6 C)     Temp Source Oral     SpO2 98 %     Weight 150 lb (68 kg)     Height 5\' 2"  (1.575 m)     Head Circumference      Peak Flow      Pain Score 6   Constitutional: Alert and oriented. Well appearing and in no distress. Eyes: Conjunctivae are normal. Normal extraocular movements. ENT   Head: Normocephalic and atraumatic.    Nose: No congestion/rhinnorhea.   Mouth/Throat: Mucous membranes are moist.   Neck: No stridor. Hematological/Lymphatic/Immunilogical: No cervical lymphadenopathy. Cardiovascular: Normal rate, regular rhythm.  No murmurs, rubs, or gallops. Respiratory: Normal respiratory effort without tachypnea nor retractions. Breath sounds are clear and equal bilaterally. No wheezes/rales/rhonchi. Gastrointestinal: Soft and minimally tender in the epigastric region. Genitourinary: Deferred Musculoskeletal: Normal range of motion in all extremities. No lower extremity edema. Neurologic:  Normal speech and language. No gross focal neurologic deficits are appreciated.  Skin:  Skin is warm, dry and intact. No rash noted. Psychiatric: Mood and affect are normal. Speech and behavior are normal. Patient exhibits appropriate insight and judgment.  ____________________________________________    LABS (pertinent positives/negatives)  Labs Reviewed  COMPREHENSIVE METABOLIC PANEL - Abnormal; Notable for the following:       Result Value   Glucose, Bld 141 (*)    Total Bilirubin 1.8 (*)    All other components within normal limits  CBC - Abnormal; Notable for the following:    WBC 12.0 (*)    RBC 3.15 (*)    Hemoglobin 10.8 (*)    HCT 31.5 (*)    MCH 34.2 (*)    RDW 17.8 (*)    All other components within normal limits  URINALYSIS COMPLETEWITH MICROSCOPIC ([facility] ONLY) - Abnormal; Notable for the following:    Color, Urine STRAW (*)    APPearance CLEAR (*)    Specific Gravity, Urine 1.003 (*)    Bacteria, UA RARE (*)    Squamous Epithelial / LPF 0-5 (*)    All other components within normal limits  TROPONIN I - Abnormal; Notable for the following:    Troponin I 0.04 (*)    All other components within normal limits  LACTIC ACID, PLASMA - Abnormal; Notable for the following:    Lactic Acid, Venous 2.3 (*)    All other components within normal limits  LACTIC ACID, PLASMA - Abnormal; Notable for  the following:    Lactic Acid, Venous 2.1 (*)    All other components within normal limits  DIFFERENTIAL - Abnormal; Notable for the following:    Neutro Abs 10.8 (*)    Lymphs Abs 0.8 (*)    All other components within normal limits  LIPASE, BLOOD     ____________________________________________   EKG  I, Nance Pear, attending physician, personally viewed and interpreted this EKG  EKG Time: 1305 Rate: 132 Rhythm: sinus tachycardia Axis: normal Intervals: qtc 429 QRS: narrow ST changes: no st elevation Impression: abnormal ekg   ____________________________________________    RADIOLOGY  CT abd/pel   IMPRESSION:  1. Dilated gallbladder with gallstones and suspicion of mild biliary  ductal enlargement. Consider acute cholecystitis or early acute  biliary obstruction. Recommend follow-up Right Upper Quadrant  Ultrasound.  2. No other acute or inflammatory process identified in the  abdomen  or pelvis. No metastatic disease identified.  3. Calcified aortic atherosclerosis.     RUQ US IMPRESSION:  Distended gallbladder with multiple gallstones and subtly edematous  wall. These are findings concerning for a degree of acute  cholecystitis. Common bile duct measures 8 mm which is prominent. No  mass or calculus is seen in the biliary duct system. Note, however,  that the distal common bile duct is obscured by gas.      ____________________________________________   PROCEDURES  Procedures  ____________________________________________   INITIAL IMPRESSION / ASSESSMENT AND PLAN / ED COURSE  Pertinent labs & imaging results that were available during my care of the patient were reviewed by me and considered in my medical decision making (see chart for details).  Patient presents to the emergency department today because of concerns for epigastric pain. Patient was mildly tender in the epigastric region. Initial blood work did show a mild leukocytosis,  this is quite elevated over her previous blood draw 3 days ago. Additionally patient had a mild lactic acidosis. Because of this I will obtain a CT scan of her abdomen and pelvis.  Clinical Course    CT scan is concerning for possible cholecystitis. Will plan on giving patient doesn't IV antibiotics and obtaining right upper quadrant ultrasound.   Right upper quadrant ultrasound again consistent with cholecystitis. Discussed with Dr. Bary Castilla, with surgery, who requested that hospitalist service admit givne history of chemotherapy. ____________________________________________   FINAL CLINICAL IMPRESSION(S) / ED DIAGNOSES  Final diagnoses:  Epigastric pain  Cholecystitis     Note: This dictation was prepared with Dragon dictation. Any transcriptional errors that result from this process are unintentional    Nance Pear, MD 06/17/16 1900

## 2016-06-17 NOTE — ED Notes (Signed)
In to check on pt after using her call bell; requesting Morphine for pain; pt's primary nurse notified and will speak with MD

## 2016-06-17 NOTE — ED Notes (Signed)
Dr. Archie Balboa notified of lactic acid 2.1

## 2016-06-18 ENCOUNTER — Inpatient Hospital Stay: Payer: Medicare Other

## 2016-06-18 ENCOUNTER — Inpatient Hospital Stay
Admit: 2016-06-18 | Discharge: 2016-06-18 | Disposition: A | Discharge status: Home / Self Care | Payer: Medicare Other | Attending: Internal Medicine | Admitting: Internal Medicine

## 2016-06-18 LAB — CBC
HEMATOCRIT: 30.4 % — AB (ref 35.0–47.0)
HEMOGLOBIN: 10 g/dL — AB (ref 12.0–16.0)
MCH: 32.9 pg (ref 26.0–34.0)
MCHC: 33 g/dL (ref 32.0–36.0)
MCV: 99.7 fL (ref 80.0–100.0)
Platelets: 160 10*3/uL (ref 150–440)
RBC: 3.05 MIL/uL — ABNORMAL LOW (ref 3.80–5.20)
RDW: 18.2 % — AB (ref 11.5–14.5)
WBC: 17.3 10*3/uL — AB (ref 3.6–11.0)

## 2016-06-18 LAB — COMPREHENSIVE METABOLIC PANEL
ALBUMIN: 3.7 g/dL (ref 3.5–5.0)
ALT: 17 U/L (ref 14–54)
ANION GAP: 9 (ref 5–15)
AST: 25 U/L (ref 15–41)
Alkaline Phosphatase: 92 U/L (ref 38–126)
BILIRUBIN TOTAL: 1.9 mg/dL — AB (ref 0.3–1.2)
BUN: 13 mg/dL (ref 6–20)
CO2: 22 mmol/L (ref 22–32)
Calcium: 8.5 mg/dL — ABNORMAL LOW (ref 8.9–10.3)
Chloride: 105 mmol/L (ref 101–111)
Creatinine, Ser: 0.55 mg/dL (ref 0.44–1.00)
GFR calc Af Amer: >60 mL/min (ref >60)
GFR calc non Af Amer: >60 mL/min (ref >60)
GLUCOSE: 124 mg/dL — AB (ref 65–99)
POTASSIUM: 3.7 mmol/L (ref 3.5–5.1)
SODIUM: 136 mmol/L (ref 135–145)
Total Protein: 6.3 g/dL — ABNORMAL LOW (ref 6.5–8.1)

## 2016-06-18 LAB — TROPONIN I
Troponin I: 0.03 ng/mL (ref <0.03)
Troponin I: 0.04 ng/mL (ref <0.03)

## 2016-06-18 MED ORDER — GADOBENATE DIMEGLUMINE 529 MG/ML IV SOLN
15.0000 mL | Freq: Once | INTRAVENOUS | Status: AC | PRN
  Administered 2016-06-18: 15:00:00 14 mL via INTRAVENOUS

## 2016-06-18 MED ORDER — FAMOTIDINE 20 MG PO TABS
20.0000 mg | ORAL_TABLET | Freq: Two times a day (BID) | ORAL | Status: DC
  Administered 2016-06-18 – 2016-06-22 (×9): 20 mg via ORAL
  Filled 2016-06-18 (×9): qty 1

## 2016-06-18 MED ORDER — ENOXAPARIN SODIUM 40 MG/0.4ML ~~LOC~~ SOLN
40.0000 mg | SUBCUTANEOUS | Status: DC
  Administered 2016-06-18 – 2016-06-21 (×4): 40 mg via SUBCUTANEOUS
  Filled 2016-06-18 (×4): qty 0.4

## 2016-06-18 MED ORDER — SENNOSIDES-DOCUSATE SODIUM 8.6-50 MG PO TABS
1.0000 | ORAL_TABLET | Freq: Two times a day (BID) | ORAL | Status: DC
  Administered 2016-06-18 – 2016-06-21 (×4): 1 via ORAL
  Filled 2016-06-18 (×5): qty 1

## 2016-06-18 NOTE — Care Management (Addendum)
Admitted to [facility] with the diagnosis of cholecystitis. Discharged from this facility 06/06/16. Lives with husband, Lanae Boast [phone]). Last seen Eulogio Bear NP on Thursday of last week. No home health. No skilled facility. No home oxygen. Uses no aids for ambulation. Takes care of all basic activities of daily living herself, doesn't drive. No falls. Good appetite last week. Lost 8 lbs since beginning Chemotherapy in August. Prescriptions are filled at [facility] in [place]. Husband or daughter will transport. Shelbie Ammons RN MSN CCM Care Management  .

## 2016-06-18 NOTE — Consult Note (Signed)
Surgery consultation note  History of Present Illness: Erica Levy is a 75 y.o. female with chief complaint of epigastric pain. She was admitted emergently yesterday with acute onset of diffuse epigastric pain. She reports the pain developed at 2:30 in the morning. She had no associated nausea or vomiting or chills or fever. He reports no history of jaundice. She was initially evaluated in the emergency room and had ultrasound was demonstrated multiple gallstones with slight thickening of the gallbladder wall. Common bile duct was 8 mm in diameter. CT scan also demonstrated gallstones. CT scan demonstrated no other type of inflammatory process in the abdomen. She reports she did receive some narcotic analgesics. She has not had any pain medicine since 3:00 in the morning. She reports no abdominal pain today. She has been taking some clear liquids. She has done a limited amount of walking since admission.  She also was in the hospital overnight about 2 weeks ago with some chest pain and had normal bilirubin at that time. She had slight elevation of her troponin.  In the course of evaluation she did have slight elevation of troponin and has had cardiology consultation note and echocardiogram has been done but not yet interpreted.  Past Medical History:  I reviewed her history of left breast cancer and did have modified radical mastectomy in June of this year. She has recently been undergoing chemotherapy and the latest dose of chemotherapy was 3 days ago. Past Medical History:  Diagnosis Date  . Cancer ([place])   . Generalized OA   . GERD (gastroesophageal reflux disease)   . Hypercholesteremia   . Hypertension   . Hypothyroid   . Migraine   . Osteoporosis   . Stroke ([place])   . TIA (transient ischemic attack)    July    Problem List: Patient Active Problem List   Diagnosis Date Noted  . Bilateral upper abdominal pain 06/17/2016  . Cholecystitis, acute 06/17/2016  . Elevated troponin  06/17/2016  . Acute cholecystitis 06/17/2016  . Chest pain 06/06/2016  . Breast cancer of upper-inner quadrant of left female breast ([place]) 01/06/2016  . CVA (cerebral infarction) 02/09/2015    Past Surgical History:Modified radical left mastectomy Past Surgical History:  Procedure Laterality Date  . BREAST BIOPSY Left    bx/clip-neg  . BREAST BIOPSY Left 12/15/2015   path pending/us and stereo bx  . CATARACT EXTRACTION    . DILATION AND CURETTAGE OF UTERUS    . EYE SURGERY Right    Cataract Extraction with IOL  . PARTIAL MASTECTOMY WITH AXILLARY SENTINEL LYMPH NODE BIOPSY Left 01/13/2016   Procedure: PARTIAL MASTECTOMY;  Surgeon: Leonie Green, MD;  Location: [facility] ORS;  Service: General;  Laterality: Left;  . PORTACATH PLACEMENT Right 01/13/2016   Procedure: INSERTION PORT-A-CATH;  Surgeon: Leonie Green, MD;  Location: [facility] ORS;  Service: General;  Laterality: Right;  . RETINAL DETACHMENT SURGERY Right    Dr. Starling Manns, [facility]  . TONSILLECTOMY      Allergies: No Known Allergies  Home Medications: Prescriptions Prior to Admission  Medication Sig Dispense Refill Last Dose  . acetaminophen (TYLENOL) 500 MG tablet Take 500 mg by mouth every 6 (six) hours as needed.   Past Month at Unknown time  . atorvastatin (LIPITOR) 80 MG tablet Take 1 tablet (80 mg total) by mouth daily at 6 PM. 30 tablet 0 06/16/2016 at 1930  . Cholecalciferol (VITAMIN D3) 5000 units CAPS Take 1 capsule by mouth every other day.   06/16/2016  at am  . levothyroxine (SYNTHROID, LEVOTHROID) 50 MCG tablet Take 1 tablet by mouth daily.  0 06/16/2016 at am  . potassium chloride SA (K-DUR,KLOR-CON) 20 MEQ tablet Take 20 mEq by mouth 2 (two) times daily.   06/16/2016 at 1930  . potassium chloride (KLOR-CON) 20 MEQ packet Take 20 mEq by mouth 2 (two) times daily. (Patient not taking: Reported on 06/17/2016) 60 packet 3 Not Taking at Unknown time  . Vitamin D, Ergocalciferol, (DRISDOL) 50000 UNITS CAPS capsule  Take 2,000 Units by mouth every evening.    Not Taking at Unknown time   Home medication reconciliation was completed with the patient.   Scheduled Inpatient Medications:   . atorvastatin  80 mg Oral q1800  . cefTRIAXone  1 g Intravenous Q24H  . docusate sodium  100 mg Oral BID  . famotidine  20 mg Oral BID  . heparin  5,000 Units Subcutaneous Q8H  . levothyroxine  50 mcg Oral QAC breakfast  . potassium chloride SA  20 mEq Oral BID  . sodium chloride flush  3 mL Intravenous Q12H    Continuous Inpatient Infusions:   . sodium chloride 100 mL/hr at 06/18/16 0617    PRN Inpatient Medications:  acetaminophen **OR** acetaminophen, bisacodyl, HYDROcodone-acetaminophen, morphine injection, ondansetron **OR** ondansetron (ZOFRAN) IV  Family History: family history includes Breast cancer (age of onset: 82) in her cousin; Dementia in her father; Hypertension in her mother; Osteoarthritis in her mother.  The patient's family history is negative for inflammatory bowel disorders, GI malignancy, or solid organ transplantation.  Social History:   reports that she has never smoked. She has never used smokeless tobacco. She reports that she does not drink alcohol or use drugs. The patient denies ETOH, tobacco, or drug use.   Review of Systems:She reports no other recent acute illness such as cough cold or sore throat. She does report some mild degree of shortness of breath. She reports she did have some chest pain 2 weeks ago and was kept overnight in the hospital for a period of observation and did have some elevation of troponin. She reports no subsequent chest pains. She reports she is voiding satisfactorily. She reports a mild degree of constipation yesterday but did move her bowels. She reports no ankle edema. No recent source or boils. She is tolerating her chemotherapy satisfactorily. Has noted some minimal degree of brown discoloration of her fingernails. Review of systems otherwise  negative.       Physical Examination: BP (!) 110/48 (BP Location: Left Arm)   Pulse (!) 102   Temp 98.1 F (36.7 C) (Oral)   Resp 18   Ht 5\' 2"  (1.575 m)   Wt 68.5 kg (151 lb)   SpO2 99%   BMI 27.62 kg/m   Current heart rate is 108  GENERAL:  Awake alert and oriented and in no acute distress.  HEENT:  Head is normocephalic.  Pupils are equal reactive to light.  Extraocular movements are intact. Sclera is clear.  Pharynx is clear.  NECK:  Supple with no palpable mass and no adenopathy.  LUNGS:  Clear without rales rhonchi or wheezes.  HEART:  Regular rhythm S1-S2, without murmur.  CHEST WALL there is a typical finding of Port-A-Cath in the right subclavian area.   ABDOMEN: Soft and flat with minimal degree of right upper quadrant tenderness, no palpable mass   NEUROLOGIC: Awake alert and oriented and moving all extremities  EXTREMITIES: Well-developed well-nourished with no dependent edema.   Data: Her  total bilirubin on 06/08/2016 was 1.0. On 1116 was 1.1. On 1119 was 1.8. Today is 1.9.   Lab Results  Component Value Date   WBC 17.3 (H) 06/18/2016   HGB 10.0 (L) 06/18/2016   HCT 30.4 (L) 06/18/2016   MCV 99.7 06/18/2016   PLT 160 06/18/2016    Recent Labs Lab 06/14/16 0947 06/17/16 1329 06/18/16 0859  HGB 9.3* 10.8* 10.0*   Lab Results  Component Value Date   NA 136 06/18/2016   K 3.7 06/18/2016   CL 105 06/18/2016   CO2 22 06/18/2016   BUN 13 06/18/2016   CREATININE 0.55 06/18/2016   Lab Results  Component Value Date   ALT 17 06/18/2016   AST 25 06/18/2016   ALKPHOS 92 06/18/2016   BILITOT 1.9 (H) 06/18/2016   No results for input(s): APTT, INR, PTT in the last 168 hours.  I reviewed the cardiology note.   Assessment/Plan: Ms. Bottone is a 75 y.o. female with recent epigastric pain and findings of gallstones and elevated total bilirubin  Consider possible common bile duct stone  Normocytic anemia, leukocytosis  Recommendations: I  recommended MRCP. I have ordered this test. I'll also discuss with her the potential need for laparoscopic cholecystectomy and also potential need for ERCP  Increase walking in the hallway.  full liquid diet.  This was discussed with Dr. Bridgett Larsson  Thank you for the consult. Please call with questions or concerns.  Rochel Brome, MD

## 2016-06-18 NOTE — Progress Notes (Signed)
Chaplain visited with pt in room 112. Provided emotional support for the pt. Pt was in good spirits.    06/18/16 1310  Clinical Encounter Type  Visited With Patient  Visit Type Initial;Spiritual support  Referral From Nurse  Spiritual Encounters  Spiritual Needs Emotional

## 2016-06-18 NOTE — Progress Notes (Signed)
See surgery consult note from earlier today.  Since the consultation I discussed with Dr.Callwood in cardiology note indicates cardiac risk is acceptable.  Echocardiogram results are pending.  MRCP was done and report indicates gallstones and some thickening of gallbladder wall and some pericholecystic fluid dilatation of the common bile duct and a small stone in the distal third of the common bile duct. Common bile duct diameter 9 mm.  She reports having no abdominal pain today. She is tolerating her full liquids. She has walked to the bathroom. She reports she is voiding satisfactorily.  Pulse rate has been rapid at times. Last measurement 119, blood pressure 96/58  On exam she is awake alert and oriented and in no acute distress. Abdomen is soft flat and nontender.  Impression cholecystitis cholelithiasis choledocholithiasis with hyperbilirubinemia possible partial bile duct obstruction.  I discussed this with Dr.Wohl who will see tomorrow for gastroenterology consultation to consider possible need for ERCP and 2 enterotomy  I'll also discuss her potential need for cholecystectomy

## 2016-06-18 NOTE — Progress Notes (Signed)
Key Points: Use following P&T approved IV to PO antibiotic change policy.  Description contains the criteria that are approved Note: Policy Excludes:  Esophagectomy patientsPHARMACIST - PHYSICIAN COMMUNICATION DR:   Bridgett Larsson CONCERNING: IV to Oral Route Change Policy  RECOMMENDATION: This patient is receiving famotidine by the intravenous route.  Based on criteria approved by the Pharmacy and Therapeutics Committee, the intravenous medication(s) is/are being converted to the equivalent oral dose form(s).   DESCRIPTION: These criteria include:  The patient is eating (either orally or via tube) and/or has been taking other orally administered medications for a least 24 hours  The patient has no evidence of active gastrointestinal bleeding or impaired GI absorption (gastrectomy, short bowel, patient on TNA or NPO).  If you have questions about this conversion, please contact the [place], [facility] 06/18/2016 8:36 AM

## 2016-06-18 NOTE — Progress Notes (Signed)
*  PRELIMINARY RESULTS* Echocardiogram 2D Echocardiogram has been performed.  Erica Levy 06/18/2016, 8:39 AM

## 2016-06-18 NOTE — Consult Note (Addendum)
Reason for Consult: Elevated troponin preop for gallbladder surgery Referring Physician: Dr. Fulton Levy hospitalist, Dr. Grayland Levy oncologist. Erica Bear NP primary  Erica Levy is an 75 y.o. female.  HPI: Patient's a 75 year old female history of breast cancer recently diagnosed status post vasectomy undergone chemotherapy patient started having abdominal discomfort diffusely underwent evaluation which suggested cholecystitis patient is now preop but found to have borderline troponins a cardiology consultation and recommendation towards being sought. Patient denies cardiac history no chest pain no significant shortness of breath. 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 Patient has no residual deficits feels reasonably well denies shortness of breath no blackout spells or syncope. Patient denies any leg edema no palpitations or tachycardia no coronary bypass now cardioversion of PCI and stents. Patient states the pain is improved since she's been on narcotic therapy  Past Medical History:  Diagnosis Date  . Cancer ([place])   . Generalized OA   . GERD (gastroesophageal reflux disease)   . Hypercholesteremia   . Hypertension   . Hypothyroid   . Migraine   . Osteoporosis   . Stroke ([place])   . TIA (transient ischemic attack)    July    Past Surgical History:  Procedure Laterality Date  . BREAST BIOPSY Left    bx/clip-neg  . BREAST BIOPSY Left 12/15/2015   path pending/us and stereo bx  . CATARACT EXTRACTION    . DILATION AND CURETTAGE OF UTERUS    . EYE SURGERY Right    Cataract Extraction with IOL  . PARTIAL MASTECTOMY WITH AXILLARY SENTINEL LYMPH NODE BIOPSY Left 01/13/2016   Procedure: PARTIAL MASTECTOMY;  Surgeon: Erica Green, MD;  Location: [facility] ORS;  Service: General;  Laterality: Left;  . PORTACATH PLACEMENT Right 01/13/2016   Procedure: INSERTION PORT-A-CATH;  Surgeon: Erica Green, MD;  Location: [facility] ORS;   Service: General;  Laterality: Right;  . RETINAL DETACHMENT SURGERY Right    Erica Levy, [facility]  . TONSILLECTOMY      Family History  Problem Relation Age of Onset  . Hypertension Mother   . Osteoarthritis Mother   . Dementia Father   . Breast cancer Cousin 20    mat cousin    Social History:  reports that she has never smoked. She has never used smokeless tobacco. She reports that she does not drink alcohol or use drugs.  Allergies: No Known Allergies  Medications: I have reviewed the patient's current medications.  Results for orders placed or performed during the hospital encounter of 06/17/16 (from the past 48 hour(s))  Lipase, blood     Status: None   Collection Time: 06/17/16  1:29 PM  Result Value Ref Range   Lipase 16 11 - 51 U/L  Comprehensive metabolic panel     Status: Abnormal   Collection Time: 06/17/16  1:29 PM  Result Value Ref Range   Sodium 137 135 - 145 mmol/L   Potassium 3.6 3.5 - 5.1 mmol/L   Chloride 103 101 - 111 mmol/L   CO2 25 22 - 32 mmol/L   Glucose, Bld 141 (H) 65 - 99 mg/dL   BUN 16 6 - 20 mg/dL   Creatinine, Ser 0.62 0.44 - 1.00 mg/dL   Calcium 9.2 8.9 - 10.3 mg/dL   Total Protein 6.9 6.5 - 8.1 g/dL   Albumin 4.2 3.5 - 5.0 g/dL   AST 27 15 - 41 U/L   ALT 20 14 - 54  U/L   Alkaline Phosphatase 113 38 - 126 U/L   Total Bilirubin 1.8 (H) 0.3 - 1.2 mg/dL   GFR calc non Af Amer >60 >60 mL/min   GFR calc Af Amer >60 >60 mL/min    Comment: (NOTE) The eGFR has been calculated using the CKD EPI equation. This calculation has not been validated in all clinical situations. eGFR's persistently <60 mL/min signify possible Chronic Kidney Disease.    Anion gap 9 5 - 15  CBC     Status: Abnormal   Collection Time: 06/17/16  1:29 PM  Result Value Ref Range   WBC 12.0 (H) 3.6 - 11.0 K/uL   RBC 3.15 (L) 3.80 - 5.20 MIL/uL   Hemoglobin 10.8 (L) 12.0 - 16.0 g/dL   HCT 31.5 (L) 35.0 - 47.0 %   MCV 100.0 80.0 - 100.0 fL   MCH 34.2 (H) 26.0 - 34.0 pg    MCHC 34.2 32.0 - 36.0 g/dL   RDW 17.8 (H) 11.5 - 14.5 %   Platelets 180 150 - 440 K/uL  Troponin I     Status: Abnormal   Collection Time: 06/17/16  1:29 PM  Result Value Ref Range   Troponin I 0.04 (HH) <0.03 ng/mL    Comment: CRITICAL RESULT CALLED TO, READ BACK BY AND VERIFIED WITH Erica Levy ON 06/17/16 AT 1437 [facility]   Differential     Status: Abnormal   Collection Time: 06/17/16  1:29 PM  Result Value Ref Range   Neutrophils Relative % 91 %   Neutro Abs 10.8 (H) 1.4 - 6.5 K/uL   Lymphocytes Relative 7 %   Lymphs Abs 0.8 (L) 1.0 - 3.6 K/uL   Monocytes Relative 2 %   Monocytes Absolute 0.3 0.2 - 0.9 K/uL   Eosinophils Relative 0 %   Eosinophils Absolute 0.0 0 - 0.7 K/uL   Basophils Relative 0 %   Basophils Absolute 0.0 0 - 0.1 K/uL  Lactic acid, plasma     Status: Abnormal   Collection Time: 06/17/16  1:34 PM  Result Value Ref Range   Lactic Acid, Venous 2.3 (HH) 0.5 - 1.9 mmol/L    Comment: CRITICAL RESULT CALLED TO, READ BACK BY AND VERIFIED WITH Erica Levy ON 06/17/16 AT 1413 [facility]   Urinalysis complete, with microscopic     Status: Abnormal   Collection Time: 06/17/16  4:01 PM  Result Value Ref Range   Color, Urine STRAW (A) YELLOW   APPearance CLEAR (A) CLEAR   Glucose, UA NEGATIVE NEGATIVE mg/dL   Bilirubin Urine NEGATIVE NEGATIVE   Ketones, ur NEGATIVE NEGATIVE mg/dL   Specific Gravity, Urine 1.003 (L) 1.005 - 1.030   Hgb urine dipstick NEGATIVE NEGATIVE   pH 6.0 5.0 - 8.0   Protein, ur NEGATIVE NEGATIVE mg/dL   Nitrite NEGATIVE NEGATIVE   Leukocytes, UA NEGATIVE NEGATIVE   RBC / HPF 0-5 0 - 5 RBC/hpf   WBC, UA 0-5 0 - 5 WBC/hpf   Bacteria, UA RARE (A) NONE SEEN   Squamous Epithelial / LPF 0-5 (A) NONE SEEN  Lactic acid, plasma     Status: Abnormal   Collection Time: 06/17/16  4:42 PM  Result Value Ref Range   Lactic Acid, Venous 2.1 (HH) 0.5 - 1.9 mmol/L    Comment: CRITICAL RESULT CALLED TO, READ BACK BY AND VERIFIED WITH Erica Levy ON 06/07/16 AT  1717 [facility]   Troponin I     Status: Abnormal   Collection Time: 06/17/16  8:32 PM  Result Value Ref Range   Troponin I 0.05 (HH) <0.03 ng/mL    Comment: CRITICAL VALUE NOTED. VALUE IS CONSISTENT WITH PREVIOUSLY REPORTED/CALLED VALUE.PMH  Troponin I     Status: Abnormal   Collection Time: 06/18/16  2:59 AM  Result Value Ref Range   Troponin I 0.03 (HH) <0.03 ng/mL    Comment: CRITICAL VALUE NOTED. VALUE IS CONSISTENT WITH PREVIOUSLY REPORTED/CALLED VALUE.PMH    Ct Abdomen Pelvis W Contrast  Result Date: 06/17/2016 CLINICAL DATA:  75 year old female with abdominal pain since 0230 hours today. Initial encounter. Left breast cancer currently on chemotherapy. EXAM: CT ABDOMEN AND PELVIS WITH CONTRAST TECHNIQUE: Multidetector CT imaging of the abdomen and pelvis was performed using the standard protocol following bolus administration of intravenous contrast. CONTRAST:  133m ISOVUE-300 IOPAMIDOL (ISOVUE-300) INJECTION 61% COMPARISON:  CT Abdomen and Pelvis 04/2016. FINDINGS: Lower chest: Sequelae of left mastectomy again noted. No pericardial or pleural effusion. Negative lung bases; minor lower lobe atelectasis. No upper abdominal free air. Hepatobiliary: No liver mass. Mild intrahepatic biliary ductal dilatation is possible in the right lobe. The gallbladder is distended, 6 cm in diameter, and multiple gallstones are apparent. The CBD is at the upper limits of normal. The gallbladder wall is mildly indistinct, but there is no confluent pericholecystic inflammation. Pancreas: Negative.  No pancreatic ductal dilatation identified. Spleen: Negative. Adrenals/Urinary Tract: Negative adrenal glands. Bilateral renal enhancement and contrast excretion is normal. Unremarkable urinary bladder. Stomach/Bowel: Decompressed distal colon. Decompressed left colon. Mild to moderate gas and retained stool throughout the transverse colon and right colon. Oral contrast has reached the ascending colon, nearly to the splenic  flexure. Negative retrocecal appendix. Negative terminal ileum. No dilated small bowel. Decompressed stomach and duodenum. Vascular/Lymphatic: Extensive Aortoiliac calcified atherosclerosis noted. Major arterial structures are patent. Portal venous system is patent. No lymphadenopathy. Reproductive: Negative. Other: No pelvic free fluid. Musculoskeletal: No acute or suspicious osseous lesion. IMPRESSION: 1. Dilated gallbladder with gallstones and suspicion of mild biliary ductal enlargement. Consider acute cholecystitis or early acute biliary obstruction. Recommend follow-up Right Upper Quadrant Ultrasound. 2. No other acute or inflammatory process identified in the abdomen or pelvis. No metastatic disease identified. 3.  Calcified aortic atherosclerosis. Electronically Signed   By: HGenevie AnnM.Levy.   On: 06/17/2016 16:39   UKoreaAbdomen Limited Ruq  Result Date: 06/17/2016 CLINICAL DATA:  One day history of epigastric region pain EXAM: [place]DOMEN LIMITED - RIGHT UPPER QUADRANT COMPARISON:  CT abdomen and pelvis June 17, 2016 FINDINGS: Gallbladder: Gallbladder is distended. Multiple gallstones are noted in the gallbladder as well as mild sludge. Largest individual gallstone measures 8 mm in length. The gallbladder wall does not appear thickened, and there is no pericholecystic fluid. Gallbladder wall does appear subtly edematous, however. No sonographic Murphy sign noted by sonographer. Common bile duct: Diameter: 8 mm which is prominent. No biliary duct mass or calculus evident. Note that the distal common bile duct is obscured by gas. Liver: No focal lesion identified. Within normal limits in parenchymal echogenicity. IMPRESSION: Distended gallbladder with multiple gallstones and subtly edematous wall. These are findings concerning for a degree of acute cholecystitis. Common bile duct measures 8 mm which is prominent. No mass or calculus is seen in the biliary duct system. Note, however, that the distal common  bile duct is obscured by gas. Electronically Signed   By: WLowella GripIII M.Levy.   On: 06/17/2016 17:37    Review of Systems  Constitutional: Positive for malaise/fatigue and weight loss.  HENT: Negative.   Eyes: Negative.   Respiratory: Negative.   Cardiovascular: Negative.   Gastrointestinal: Positive for abdominal pain, heartburn and nausea.  Genitourinary: Negative.   Musculoskeletal: Positive for myalgias.  Skin: Negative.   Neurological: Negative.   Endo/Heme/Allergies: Negative.   Psychiatric/Behavioral: Negative.    Blood pressure (!) 110/48, pulse (!) 102, temperature 98.1 F (36.7 C), temperature source Oral, resp. rate 18, height 5' 2"  (1.575 m), weight 68.5 kg (151 lb), SpO2 99 %. Physical Exam  Nursing note and vitals reviewed. Constitutional: She is oriented to person, place, and time. She appears well-developed and well-nourished.  HENT:  Head: Normocephalic and atraumatic.  Eyes: Conjunctivae and EOM are normal. Pupils are equal, round, and reactive to light.  Neck: Normal range of motion. Neck supple.  Cardiovascular: Normal rate, regular rhythm, normal heart sounds and intact distal pulses.   Respiratory: Effort normal and breath sounds normal.  GI: Soft. Bowel sounds are normal.  Musculoskeletal: Normal range of motion.  Neurological: She is alert and oriented to person, place, and time. She has normal reflexes.  Skin: Skin is warm and dry.  Psychiatric: She has a normal mood and affect.    Assessment/Plan: Elevated troponin probably demand ischemia Hypertension History of CVA Abdominal pain Cholecystitis History of breast cancer Currently undergoing chemotherapy GERD Thyroid disease Osteoarthritis Hyperlipidemia . Plan Agree with admission and evaluation for gallbladder surgery Borderline troponins probably demand ischemia Agree with echocardiogram for further assessment Continue hypertension control Agree with pain control with narcotics  as needed Continue levothyroxine for thyroid management DVT prophylaxis Continue Lipitor for lipid management Patient appears to be an acceptable surgical risk for gallbladder surgery I do not recommend any further intervention to modify her risk Doubt non-STEMI borderline troponins probably demand ischemia  Recommend conservative therapy  Erica Levy Erica Levy 06/18/2016, 9:07 AM

## 2016-06-18 NOTE — Progress Notes (Signed)
[place] [facility] [place] NAME: Erica Levy    MR#:  [id number]  DATE OF BIRTH:  1940-08-06  SUBJECTIVE:  CHIEF COMPLAINT:   Chief Complaint  Patient presents with  . Abdominal Pain   Mild abd epigastric pain. REVIEW OF SYSTEMS:  Review of Systems  Constitutional: Positive for malaise/fatigue. Negative for chills and fever.  HENT: Negative for congestion.   Eyes: Negative for blurred vision and double vision.  Respiratory: Negative for cough, shortness of breath and stridor.   Cardiovascular: Negative for chest pain and leg swelling.  Gastrointestinal: Positive for abdominal pain. Negative for blood in stool, diarrhea, nausea and vomiting.  Genitourinary: Negative for dysuria and hematuria.  Musculoskeletal: Negative for joint pain.  Neurological: Negative for dizziness and loss of consciousness.  Psychiatric/Behavioral: Negative for depression. The patient is not nervous/anxious.     DRUG ALLERGIES:  No Known Allergies VITALS:  Blood pressure (!) 110/48, pulse (!) 102, temperature 98.1 F (36.7 C), temperature source Oral, resp. rate 18, height 5\' 2"  (1.575 m), weight 151 lb (68.5 kg), SpO2 99 %. PHYSICAL EXAMINATION:  Physical Exam  Constitutional: She is oriented to person, place, and time and well-developed, well-nourished, and in no distress.  HENT:  Head: Normocephalic.  Mouth/Throat: Oropharynx is clear and moist.  Eyes: Conjunctivae and EOM are normal. No scleral icterus.  Neck: Normal range of motion. Neck supple. No JVD present.  Cardiovascular: Normal rate, regular rhythm and normal heart sounds.  Exam reveals no gallop.   No murmur heard. Pulmonary/Chest: Effort normal and breath sounds normal. No respiratory distress. She has no wheezes. She has no rales.  Abdominal: Soft. Bowel sounds are normal. She exhibits no distension. There is no tenderness. There is no rebound and no guarding.  Negative Murphy's sign.    Musculoskeletal: Normal range of motion. She exhibits no edema or tenderness.  Neurological: She is alert and oriented to person, place, and time. No cranial nerve deficit.  Skin: No rash noted. No erythema.  Psychiatric: Affect normal.   LABORATORY PANEL:   CBC  Recent Labs Lab 06/18/16 0859  WBC 17.3*  HGB 10.0*  HCT 30.4*  PLT 160   ------------------------------------------------------------------------------------------------------------------ Chemistries   Recent Labs Lab 06/18/16 0859  NA 136  K 3.7  CL 105  CO2 22  GLUCOSE 124*  BUN 13  CREATININE 0.55  CALCIUM 8.5*  AST 25  ALT 17  ALKPHOS 92  BILITOT 1.9*   RADIOLOGY:  Ct Abdomen Pelvis W Contrast  Result Date: 06/17/2016 CLINICAL DATA:  75 year old female with abdominal pain since 0230 hours today. Initial encounter. Left breast cancer currently on chemotherapy. EXAM: CT ABDOMEN AND PELVIS WITH CONTRAST TECHNIQUE: Multidetector CT imaging of the abdomen and pelvis was performed using the standard protocol following bolus administration of intravenous contrast. CONTRAST:  140mL ISOVUE-300 IOPAMIDOL (ISOVUE-300) INJECTION 61% COMPARISON:  CT Abdomen and Pelvis 04/2016. FINDINGS: Lower chest: Sequelae of left mastectomy again noted. No pericardial or pleural effusion. Negative lung bases; minor lower lobe atelectasis. No upper abdominal free air. Hepatobiliary: No liver mass. Mild intrahepatic biliary ductal dilatation is possible in the right lobe. The gallbladder is distended, 6 cm in diameter, and multiple gallstones are apparent. The CBD is at the upper limits of normal. The gallbladder wall is mildly indistinct, but there is no confluent pericholecystic inflammation. Pancreas: Negative.  No pancreatic ductal dilatation identified. Spleen: Negative. Adrenals/Urinary Tract: Negative adrenal glands. Bilateral renal enhancement and contrast excretion is normal. Unremarkable  urinary bladder. Stomach/Bowel:  Decompressed distal colon. Decompressed left colon. Mild to moderate gas and retained stool throughout the transverse colon and right colon. Oral contrast has reached the ascending colon, nearly to the splenic flexure. Negative retrocecal appendix. Negative terminal ileum. No dilated small bowel. Decompressed stomach and duodenum. Vascular/Lymphatic: Extensive Aortoiliac calcified atherosclerosis noted. Major arterial structures are patent. Portal venous system is patent. No lymphadenopathy. Reproductive: Negative. Other: No pelvic free fluid. Musculoskeletal: No acute or suspicious osseous lesion. IMPRESSION: 1. Dilated gallbladder with gallstones and suspicion of mild biliary ductal enlargement. Consider acute cholecystitis or early acute biliary obstruction. Recommend follow-up Right Upper Quadrant Ultrasound. 2. No other acute or inflammatory process identified in the abdomen or pelvis. No metastatic disease identified. 3.  Calcified aortic atherosclerosis. Electronically Signed   By: Genevie Ann M.D.   On: 06/17/2016 16:39   US Abdomen Limited Ruq  Result Date: 06/17/2016 CLINICAL DATA:  One day history of epigastric region pain EXAM: US ABDOMEN LIMITED - RIGHT UPPER QUADRANT COMPARISON:  CT abdomen and pelvis June 17, 2016 FINDINGS: Gallbladder: Gallbladder is distended. Multiple gallstones are noted in the gallbladder as well as mild sludge. Largest individual gallstone measures 8 mm in length. The gallbladder wall does not appear thickened, and there is no pericholecystic fluid. Gallbladder wall does appear subtly edematous, however. No sonographic Murphy sign noted by sonographer. Common bile duct: Diameter: 8 mm which is prominent. No biliary duct mass or calculus evident. Note that the distal common bile duct is obscured by gas. Liver: No focal lesion identified. Within normal limits in parenchymal echogenicity. IMPRESSION: Distended gallbladder with multiple gallstones and subtly edematous wall.  These are findings concerning for a degree of acute cholecystitis. Common bile duct measures 8 mm which is prominent. No mass or calculus is seen in the biliary duct system. Note, however, that the distal common bile duct is obscured by gas. Electronically Signed   By: Lowella Grip III M.D.   On: 06/17/2016 17:37   ASSESSMENT AND PLAN:   Acute cholecystitis with leukocytosis,  NPO, IV fluids and IV rocephin. Zofran and Morphine IV as needed. F/u surgery consult and CBC.  Elevated troponin Per Dr. Clayborn Bigness, not recommend any further intervention to modify her risk Doubt non-STEMI borderline troponins probably demand ischemia.Continue Lipitor for lipid management. Echo.    Breast cancer of upper-inner quadrant of left female breast [facility])  All the records are reviewed and case discussed with Care Management/Social Worker. Management plans discussed with the patient, family and they are in agreement.  CODE STATUS: full code.  TOTAL TIME TAKING CARE OF THIS PATIENT: 37 minutes.   More than 50% of the time was spent in counseling/coordination of care: YES  POSSIBLE D/C IN 3 DAYS, DEPENDING ON CLINICAL CONDITION.   Demetrios Loll M.D on 06/18/2016 at 1:13 PM  Between 7am to 6pm - Pager - [phone]  After 6pm go to [email or web address] - Proofreader  [facility]  Office  [phone]  CC: Primary care physician; WHITE, [name], NP  Note: This dictation was prepared with Dragon dictation along with smaller phrase technology. Any transcriptional errors that result from this process are unintentional.

## 2016-06-18 NOTE — Care Management Important Message (Signed)
Important Message  Patient Details  Name: Erica Levy MRN: [id number] Date of Birth: 10/19/1940   Medicare Important Message Given:  Yes    Shelbie Ammons, RN 06/18/2016, 8:49 AM

## 2016-06-19 ENCOUNTER — Inpatient Hospital Stay: Payer: Medicare Other | Admitting: Registered Nurse

## 2016-06-19 ENCOUNTER — Encounter
Admission: EM | Disposition: A | Discharge status: Home / Self Care | Payer: Self-pay | Source: Home / Self Care | Attending: Internal Medicine

## 2016-06-19 HISTORY — PX: ENDOSCOPIC RETROGRADE CHOLANGIOPANCREATOGRAPHY (ERCP) WITH PROPOFOL: SHX5810

## 2016-06-19 LAB — COMPREHENSIVE METABOLIC PANEL
ALBUMIN: 2.9 g/dL — AB (ref 3.5–5.0)
ALT: 14 U/L (ref 14–54)
ANION GAP: 5 (ref 5–15)
AST: 19 U/L (ref 15–41)
Alkaline Phosphatase: 105 U/L (ref 38–126)
BILIRUBIN TOTAL: 1.3 mg/dL — AB (ref 0.3–1.2)
BUN: 9 mg/dL (ref 6–20)
CHLORIDE: 109 mmol/L (ref 101–111)
CO2: 24 mmol/L (ref 22–32)
Calcium: 8.1 mg/dL — ABNORMAL LOW (ref 8.9–10.3)
Creatinine, Ser: 0.53 mg/dL (ref 0.44–1.00)
GFR calc Af Amer: >60 mL/min (ref >60)
GFR calc non Af Amer: >60 mL/min (ref >60)
GLUCOSE: 122 mg/dL — AB (ref 65–99)
POTASSIUM: 3.3 mmol/L — AB (ref 3.5–5.1)
SODIUM: 138 mmol/L (ref 135–145)
TOTAL PROTEIN: 5.1 g/dL — AB (ref 6.5–8.1)

## 2016-06-19 LAB — ECHOCARDIOGRAM COMPLETE
Height: 62 in
Weight: 2416 oz

## 2016-06-19 LAB — CBC
HCT: 24.2 % — ABNORMAL LOW (ref 35.0–47.0)
Hemoglobin: 7.9 g/dL — ABNORMAL LOW (ref 12.0–16.0)
MCH: 32.2 pg (ref 26.0–34.0)
MCHC: 32.7 g/dL (ref 32.0–36.0)
MCV: 98.6 fL (ref 80.0–100.0)
PLATELETS: 133 10*3/uL — AB (ref 150–440)
RBC: 2.46 MIL/uL — ABNORMAL LOW (ref 3.80–5.20)
RDW: 17.4 % — AB (ref 11.5–14.5)
WBC: 16.3 10*3/uL — AB (ref 3.6–11.0)

## 2016-06-19 LAB — MAGNESIUM: Magnesium: 1.7 mg/dL (ref 1.7–2.4)

## 2016-06-19 SURGERY — ENDOSCOPIC RETROGRADE CHOLANGIOPANCREATOGRAPHY (ERCP) WITH PROPOFOL
Anesthesia: General

## 2016-06-19 MED ORDER — INDOMETHACIN 50 MG RE SUPP
50.0000 mg | Freq: Once | RECTAL | Status: AC
  Administered 2016-06-19: 50 mg via RECTAL

## 2016-06-19 MED ORDER — PHENYLEPHRINE HCL 10 MG/ML IJ SOLN
INTRAMUSCULAR | Status: DC | PRN
  Administered 2016-06-19 (×4): 100 ug via INTRAVENOUS

## 2016-06-19 MED ORDER — FENTANYL CITRATE (PF) 100 MCG/2ML IJ SOLN: 25.0000 ug | INTRAMUSCULAR | Status: DC | PRN

## 2016-06-19 MED ORDER — FENTANYL CITRATE (PF) 100 MCG/2ML IJ SOLN
INTRAMUSCULAR | Status: DC | PRN
  Administered 2016-06-19: 50 ug via INTRAVENOUS

## 2016-06-19 MED ORDER — ONDANSETRON HCL 4 MG/2ML IJ SOLN: 4.0000 mg | Freq: Once | INTRAMUSCULAR | Status: DC | PRN

## 2016-06-19 MED ORDER — SUCCINYLCHOLINE CHLORIDE 20 MG/ML IJ SOLN
INTRAMUSCULAR | Status: DC | PRN
  Administered 2016-06-19: 80 mg via INTRAVENOUS

## 2016-06-19 MED ORDER — SUGAMMADEX SODIUM 200 MG/2ML IV SOLN
INTRAVENOUS | Status: DC | PRN
  Administered 2016-06-19: 120 mg via INTRAVENOUS

## 2016-06-19 MED ORDER — SODIUM CHLORIDE 0.9 % IV SOLN
INTRAVENOUS | Status: DC
  Administered 2016-06-19: 11:00:00 via INTRAVENOUS

## 2016-06-19 MED ORDER — INDOMETHACIN 50 MG RE SUPP
100.0000 mg | Freq: Once | RECTAL | Status: AC
  Administered 2016-06-19: 50 mg via RECTAL
  Filled 2016-06-19: qty 2

## 2016-06-19 MED ORDER — PROPOFOL 10 MG/ML IV BOLUS
INTRAVENOUS | Status: DC | PRN
  Administered 2016-06-19: 150 mg via INTRAVENOUS

## 2016-06-19 MED ORDER — ROCURONIUM BROMIDE 100 MG/10ML IV SOLN
INTRAVENOUS | Status: DC | PRN
  Administered 2016-06-19 (×2): 5 mg via INTRAVENOUS

## 2016-06-19 MED ORDER — POTASSIUM CHLORIDE IN NACL 40-0.9 MEQ/L-% IV SOLN
INTRAVENOUS | Status: DC
  Administered 2016-06-19 – 2016-06-20 (×2): 75 mL/h via INTRAVENOUS
  Filled 2016-06-19 (×4): qty 1000

## 2016-06-19 MED ORDER — ONDANSETRON HCL 4 MG/2ML IJ SOLN
INTRAMUSCULAR | Status: DC | PRN
  Administered 2016-06-19: 4 mg via INTRAVENOUS

## 2016-06-19 MED ORDER — LIDOCAINE HCL (CARDIAC) 20 MG/ML IV SOLN
INTRAVENOUS | Status: DC | PRN
  Administered 2016-06-19: 60 mg via INTRAVENOUS

## 2016-06-19 NOTE — Op Note (Addendum)
[facility] Patient Name: Erica Levy Procedure Date: 06/19/2016 11:54 AM MRN: [id number] Account #: [id number] Date of Birth: [birth year] Admit Type: Inpatient Age: 75 Room: [facility] ENDO ROOM 4 Gender: Female Note Status: Finalized Procedure:            ERCP Indications:          Bile duct stone on magnetic resonance                        cholangiopancreatography, Suspected bile duct stone(s) Providers:            Lucilla Lame MD, MD Referring MD:         Dani Gobble. Dema Severin, MD (Referring MD) Medicines:            General Anesthesia Complications:        No immediate complications. Procedure:            Pre-Anesthesia Assessment:                       - Prior to the procedure, a History and Physical was                        performed, and patient medications and allergies were                        reviewed. The patient's tolerance of previous                        anesthesia was also reviewed. The risks and benefits of                        the procedure and the sedation options and risks were                        discussed with the patient. All questions were                        answered, and informed consent was obtained. Prior                        Anticoagulants: The patient has taken no previous                        anticoagulant or antiplatelet agents. ASA Grade                        Assessment: II - A patient with mild systemic disease.                        After reviewing the risks and benefits, the patient was                        deemed in satisfactory condition to undergo the                        procedure.                       After obtaining informed consent, the scope was passed  under direct vision. Throughout the procedure, the                        patient's blood pressure, pulse, and oxygen saturations                        were monitored continuously. The Endosonoscope was                 introduced through the mouth, and used to inject                        contrast into and used to inject contrast into the bile                        duct. The ERCP was accomplished without difficulty. The                        patient tolerated the procedure well. Findings:      The scout film was normal. The esophagus was successfully intubated       under direct vision. The scope was advanced to a normal major papilla in       the descending duodenum without detailed examination of the pharynx,       larynx and associated structures, and upper GI tract. The upper GI tract       was grossly normal. The bile duct was deeply cannulated with the       short-nosed traction sphincterotome. Contrast was injected. I personally       interpreted the bile duct images. There was brisk flow of contrast       through the ducts. Image quality was excellent. Contrast extended to the       entire biliary tree. A wire was passed into the biliary tree. Biliary       sphincterotomy was made with a traction (standard) sphincterotome using       ERBE electrocautery. There was no post-sphincterotomy bleeding. The       biliary tree was swept with a 15 mm balloon starting at the bifurcation.       Sludge was swept from the duct. Impression:           - A biliary sphincterotomy was performed.                       - The biliary tree was swept and sludge was found. Recommendation:       - Watch for pancreatitis, bleeding, perforation, and                        cholangitis.                       - Clear liquid diet. Procedure Code(s):    --- Professional ---                       [phone], Endoscopic retrograde cholangiopancreatography                        (ERCP); with removal of calculi/debris from                        biliary/pancreatic duct(s)  [id number], Endoscopic retrograde cholangiopancreatography                        (ERCP); with sphincterotomy/papillotomy                        [phone], Endoscopic catheterization of the biliary ductal                        system, radiological supervision and interpretation Diagnosis Code(s):    --- Professional ---                       K80.50, Calculus of bile duct without cholangitis or                        cholecystitis without obstruction CPT copyright 2016 American Medical Association. All rights reserved. The codes documented in this report are preliminary and upon coder review may  be revised to meet current compliance requirements. Lucilla Lame MD, MD 06/19/2016 12:34:57 PM This report has been signed electronically. Number of Addenda: 0 Note Initiated On: 06/19/2016 11:54 AM      [facility]

## 2016-06-19 NOTE — Anesthesia Preprocedure Evaluation (Signed)
Anesthesia Evaluation  Patient identified by MRN, date of birth, ID band Patient awake    Reviewed: Allergy & Precautions, NPO status , Patient's Chart, lab work & pertinent test results, reviewed documented beta blocker date and time   Airway Mallampati: II  TM Distance: >3 FB     Dental  (+) Chipped   Pulmonary neg pulmonary ROS,    Pulmonary exam normal        Cardiovascular hypertension, Pt. 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 No date: Cancer [facility])     Comment: breast No date: Generalized OA No date: GERD (gastroesophageal reflux disease) No date: Hypercholesteremia No date: Hypertension No date: Hypothyroid No date: Migraine No date: Osteoporosis  No date: TIA (transient ischemic attack)     Comment: July  Reproductive/Obstetrics                             Anesthesia Physical  Anesthesia Plan  ASA: III  Anesthesia Plan: General   Post-op Pain Management:    Induction: Intravenous  Airway Management Planned: Oral ETT  Additional Equipment:   Intra-op Plan:   Post-operative Plan: Extubation in OR  Informed Consent: I have reviewed the patients History and Physical, chart, labs and discussed the procedure including the risks, benefits and alternatives for the proposed anesthesia with the patient or authorized representative who has indicated his/her understanding and acceptance.     Plan Discussed with: CRNA and  Surgeon  Anesthesia Plan Comments:         Anesthesia Quick Evaluation

## 2016-06-19 NOTE — OR Nursing (Signed)
Patient was intubated by Anesthesia staff.

## 2016-06-19 NOTE — Anesthesia Procedure Notes (Signed)
Procedure Name: Intubation Date/Time: 06/19/2016 12:00 PM Performed by: Hedda Slade Pre-anesthesia Checklist: Patient identified, Emergency Drugs available, Suction available and Patient being monitored Patient Re-evaluated:Patient Re-evaluated prior to inductionOxygen Delivery Method: Circle system utilized Preoxygenation: Pre-oxygenation with 100% oxygen Intubation Type: IV induction Ventilation: Mask ventilation without difficulty Laryngoscope Size: Mac and 3 Grade View: Grade I Tube type: Oral Tube size: 6.5 mm Number of attempts: 1 Airway Equipment and Method: Stylet Placement Confirmation: ETT inserted through vocal cords under direct vision,  positive ETCO2 and breath sounds checked- equal and bilateral Secured at: 21 cm Tube secured with: Tape Dental Injury: Teeth and Oropharynx as per pre-operative assessment

## 2016-06-19 NOTE — Transfer of Care (Signed)
Immediate Anesthesia Transfer of Care Note  Patient: Erica Levy  Procedure(s) Performed: Procedure(s): ENDOSCOPIC RETROGRADE CHOLANGIOPANCREATOGRAPHY (ERCP) WITH PROPOFOL (N/A)  Patient Location: PACU  Anesthesia Type:General  Level of Consciousness: awake and alert   Airway & Oxygen Therapy: Patient Spontanous Breathing  Post-op Assessment: Report given to RN and Post -op Vital signs reviewed and stable  Post vital signs: Reviewed and stable  Last Vitals:  Vitals:   06/19/16 1041 06/19/16 1245  BP: (!) 142/47 (!) 130/55  Pulse: (!) 116 95  Resp: 18 (!) 24  Temp: 37.7 C 36.3 C    Last Pain:  Vitals:   06/19/16 1245  TempSrc: Oral  PainSc:          Complications: No apparent anesthesia complications

## 2016-06-19 NOTE — Progress Notes (Signed)
She reports no abdominal pain today. She tolerated her full liquids yesterday. She did have a bowel movement last night. She has done a minimal amount of walking. She reports no difficulty breathing and no chest pain.  Review of medicines with her in the chart indicates she has not had any pain medicine yesterday or today.  Temperature 99.9, blood pressure 142/47, pulse rate 116  On examination she is awake alert and oriented and in no acute distress. Lung sounds are clear, heart regular rhythm S1 and S2. Abdomen soft flat and nontender with no palpable mass  White blood count 6300, hemoglobin 7.9, platelet count 133,000. [place] a metabolic panel with potassium of 3.3, glucose 122, total bilirubin improved at 1.3  I again reviewed with her the MRI or CT findings of gallstones with thickened gallbladder wall and 3 mm common duct stone and intrahepatic biliary dilatation  Echocardiogram report pending  Impression cholecystitis cholelithiasis, choledocholithiasis, hyperbilirubinemia, anemia, mild thrombocytopenia, hypokalemia  I discussed with Dr.Chen and also with Dr.Wohl. Plan is to do ERCP today. Cause of anemia is unclear present the plan to repeat hemoglobin

## 2016-06-19 NOTE — Progress Notes (Signed)
[place] at [place] NAME: Elene Krumrey    MR#:  [id number]  DATE OF BIRTH:  Mar 16, 1941  SUBJECTIVE:  CHIEF COMPLAINT:   Chief Complaint  Patient presents with  . Abdominal Pain   Mild abd epigastric pain after ERCP just now. REVIEW OF SYSTEMS:  Review of Systems  Constitutional: Positive for malaise/fatigue. Negative for chills and fever.  HENT: Negative for congestion.   Eyes: Negative for blurred vision and double vision.  Respiratory: Negative for cough, shortness of breath and stridor.   Cardiovascular: Negative for chest pain and leg swelling.  Gastrointestinal: Positive for abdominal pain. Negative for blood in stool, diarrhea, nausea and vomiting.  Genitourinary: Negative for dysuria and hematuria.  Musculoskeletal: Negative for joint pain.  Neurological: Negative for dizziness and loss of consciousness.  Psychiatric/Behavioral: Negative for depression. The patient is not nervous/anxious.     DRUG ALLERGIES:  No Known Allergies VITALS:  Blood pressure (!) 122/53, pulse (!) 104, temperature 97.8 F (36.6 C), temperature source Oral, resp. rate 18, height 5\' 2"  (1.575 m), weight 151 lb (68.5 kg), SpO2 96 %. PHYSICAL EXAMINATION:  Physical Exam  Constitutional: She is oriented to person, place, and time and well-developed, well-nourished, and in no distress.  HENT:  Head: Normocephalic.  Mouth/Throat: Oropharynx is clear and moist.  Eyes: Conjunctivae and EOM are normal. No scleral icterus.  Neck: Normal range of motion. Neck supple. No JVD present.  Cardiovascular: Normal rate, regular rhythm and normal heart sounds.  Exam reveals no gallop.   No murmur heard. Pulmonary/Chest: Effort normal and breath sounds normal. No respiratory distress. She has no wheezes. She has no rales.  Abdominal: Soft. Bowel sounds are normal. She exhibits no distension. There is no tenderness. There is no rebound and no guarding.  Negative  Murphy's sign.  Musculoskeletal: Normal range of motion. She exhibits no edema or tenderness.  Neurological: She is alert and oriented to person, place, and time. No cranial nerve deficit.  Skin: No rash noted. No erythema.  Psychiatric: Affect normal.   LABORATORY PANEL:   CBC  Recent Labs Lab 06/19/16 0527  WBC 16.3*  HGB 7.9*  HCT 24.2*  PLT 133*   ------------------------------------------------------------------------------------------------------------------ Chemistries   Recent Labs Lab 06/19/16 0527  NA 138  K 3.3*  CL 109  CO2 24  GLUCOSE 122*  BUN 9  CREATININE 0.53  CALCIUM 8.1*  MG 1.7  AST 19  ALT 14  ALKPHOS 105  BILITOT 1.3*   RADIOLOGY:  No results found. ASSESSMENT AND PLAN:   Acute cholecystitis with leukocytosis,  Calculus of bile duct without cholangitis or cholecystitis without obstruction per Dr. Allen Norris. S/p ERCP, A biliary sphincterotomy was performed. Continue IV fluids and IV rocephin. Zofran and Morphine IV as needed. F/u Dr. Tamala Julian and CBC.  Elevated troponin Per Dr. Clayborn Bigness, not recommend any further intervention to modify her risk Doubt non-STEMI borderline troponins probably demand ischemia.Continue Lipitor for lipid management. Echo.    Breast cancer of upper-inner quadrant of left female breast ([place]). Follow-up oncologist as outpatient.  Hypokalemia. Give potassium supplement.  Discussed with Dr. Tamala Julian. All the records are reviewed and case discussed with Care Management/Social Worker. Management plans discussed with the patient, her daughter and they are in agreement.  CODE STATUS: full code.  TOTAL TIME TAKING CARE OF THIS PATIENT: 39 minutes.   More than 50% of the time was spent in counseling/coordination of care: YES  POSSIBLE D/C IN 3 DAYS, DEPENDING  ON CLINICAL CONDITION.   Demetrios Loll M.D on 06/19/2016 at 3:39 PM  Between 7am to 6pm - Pager - [phone]  After 6pm go to [email or web address] - Solicitor  [facility] [place] [facility]  Office  [phone]  CC: Primary care physician; WHITE, [name], NP  Note: This dictation was prepared with Dragon dictation along with smaller phrase technology. Any transcriptional errors that result from this process are unintentional.

## 2016-06-19 NOTE — Progress Notes (Signed)
She had no pain until after ERCP and sphincterotomy. She did have the study with removal of sludge. Afterwards has had some epigastric pain and has received some morphine which helps. She has slipped slowly on clear liquids.  Pulse rate has gradually decreased to around 100-104  On exam she has no right upper quadrant tenderness. There is minimal amount of midline epigastric tenderness.  Diagnosis cholecystitis cholelithiasis and choledocholithiasis and transient hyperbilirubinemia, anemia, hypokalemia, mild thrombocytopenia  Plan is to give some time for recovery now but she said ERCP and sphincterotomy. Currently starting with clear liquids and anticipated advancing her diet as tolerated. I discussed with her the anticipation of laparoscopic cholecystectomy. We may need to see that her anemia is improved before surgery. Follow-up lab work tomorrow morning.

## 2016-06-20 ENCOUNTER — Encounter: Payer: Self-pay | Admitting: Gastroenterology

## 2016-06-20 LAB — BASIC METABOLIC PANEL
ANION GAP: 4 — AB (ref 5–15)
BUN: 8 mg/dL (ref 6–20)
CHLORIDE: 110 mmol/L (ref 101–111)
CO2: 23 mmol/L (ref 22–32)
Calcium: 7.9 mg/dL — ABNORMAL LOW (ref 8.9–10.3)
Creatinine, Ser: 0.42 mg/dL — ABNORMAL LOW (ref 0.44–1.00)
GFR calc non Af Amer: >60 mL/min (ref >60)
Glucose, Bld: 130 mg/dL — ABNORMAL HIGH (ref 65–99)
Potassium: 3.6 mmol/L (ref 3.5–5.1)
Sodium: 137 mmol/L (ref 135–145)

## 2016-06-20 LAB — HEPATIC FUNCTION PANEL
ALK PHOS: 208 U/L — AB (ref 38–126)
ALT: 54 U/L (ref 14–54)
AST: 63 U/L — ABNORMAL HIGH (ref 15–41)
Albumin: 2.7 g/dL — ABNORMAL LOW (ref 3.5–5.0)
BILIRUBIN DIRECT: 2.6 mg/dL — AB (ref 0.1–0.5)
BILIRUBIN INDIRECT: 2.1 mg/dL — AB (ref 0.3–0.9)
BILIRUBIN TOTAL: 4.7 mg/dL — AB (ref 0.3–1.2)
Total Protein: 4.8 g/dL — ABNORMAL LOW (ref 6.5–8.1)

## 2016-06-20 LAB — CBC
HCT: 23.3 % — ABNORMAL LOW (ref 35.0–47.0)
HEMOGLOBIN: 7.8 g/dL — AB (ref 12.0–16.0)
MCH: 33 pg (ref 26.0–34.0)
MCHC: 33.3 g/dL (ref 32.0–36.0)
MCV: 98.9 fL (ref 80.0–100.0)
Platelets: 118 10*3/uL — ABNORMAL LOW (ref 150–440)
RBC: 2.35 MIL/uL — AB (ref 3.80–5.20)
RDW: 18 % — ABNORMAL HIGH (ref 11.5–14.5)
WBC: 10.7 10*3/uL (ref 3.6–11.0)

## 2016-06-20 MED ORDER — SODIUM CHLORIDE 0.9 % IV SOLN
INTRAVENOUS | Status: DC
  Administered 2016-06-20 (×2): via INTRAVENOUS

## 2016-06-20 NOTE — Anesthesia Postprocedure Evaluation (Signed)
Anesthesia Post Note  Patient: JANVI MUNAFO  Procedure(s) Performed: Procedure(s) (LRB): ENDOSCOPIC RETROGRADE CHOLANGIOPANCREATOGRAPHY (ERCP) WITH PROPOFOL (N/A)  Patient location during evaluation: PACU Anesthesia Type: General Level of consciousness: awake and alert and oriented Pain management: pain level controlled Vital Signs Assessment: post-procedure vital signs reviewed and stable Respiratory status: spontaneous breathing Cardiovascular status: blood pressure returned to baseline Anesthetic complications: no    Last Vitals:  Vitals:   06/20/16 0355 06/20/16 1616  BP: (!) 100/43 131/63  Pulse: (!) 106 (!) 113  Resp: 18 20  Temp: 37.2 C 36.7 C    Last Pain:  Vitals:   06/20/16 1616  TempSrc: Oral  PainSc:                  Tinslee Klare

## 2016-06-20 NOTE — Progress Notes (Signed)
Advanced Care Plan.  Purpose of Encounter: Code status Parties in Attendance: Patient, his son and we Patient's Decisional Capacity: Yes Subjective/Patient Story: Erica Levy is a 75 y.o. female has a past medical history significant for HTN, CVA, breast cancer on chemo and hypothyroidism now with 2-3 week hx of upper abdominal pain. Objective/Medical Story:  Acute cholecystitis with leukocytosis, Calculus of bile duct without cholangitis or cholecystitis without obstruction, Elevated bilirubin and LFT,  and breast cancer on chemotherapy.  Goals of Care Determinations: The patient decided change in colder status to DO NOT RESUSCITATE.  Plan:  Code Status: DO NOT RESUSCITATE  Time spent discussing advance care planning: 18 minutes.

## 2016-06-20 NOTE — Progress Notes (Addendum)
[place] [facility] [place] NAME: Harrie Schwantz    MR#:  [id number]  DATE OF BIRTH:  12-23-1940  SUBJECTIVE:  CHIEF COMPLAINT:   Chief Complaint  Patient presents with  . Abdominal Pain   No abd pain, tolerated full liquid diet. REVIEW OF SYSTEMS:  Review of Systems  Constitutional: Positive for malaise/fatigue. Negative for chills and fever.  HENT: Negative for congestion.   Eyes: Negative for blurred vision and double vision.  Respiratory: Negative for cough, shortness of breath and stridor.   Cardiovascular: Negative for chest pain and leg swelling.  Gastrointestinal: Negative for abdominal pain, blood in stool, diarrhea, nausea and vomiting.  Genitourinary: Negative for dysuria and hematuria.  Musculoskeletal: Negative for joint pain.  Neurological: Negative for dizziness and loss of consciousness.  Psychiatric/Behavioral: Negative for depression. The patient is not nervous/anxious.     DRUG ALLERGIES:  No Known Allergies VITALS:  Blood pressure (!) 100/43, pulse (!) 106, temperature 99 F (37.2 C), temperature source Oral, resp. rate 18, height 5\' 2"  (1.575 m), weight 160 lb 4.8 oz (72.7 kg), SpO2 94 %. PHYSICAL EXAMINATION:  Physical Exam  Constitutional: She is oriented to person, place, and time and well-developed, well-nourished, and in no distress.  HENT:  Head: Normocephalic.  Mouth/Throat: Oropharynx is clear and moist.  Eyes: Conjunctivae and EOM are normal. No scleral icterus.  Neck: Normal range of motion. Neck supple. No JVD present.  Cardiovascular: Normal rate, regular rhythm and normal heart sounds.  Exam reveals no gallop.   No murmur heard. Pulmonary/Chest: Effort normal and breath sounds normal. No respiratory distress. She has no wheezes. She has no rales.  Abdominal: Soft. Bowel sounds are normal. She exhibits no distension. There is no tenderness. There is no rebound and no guarding.  Negative Murphy's sign.    Musculoskeletal: Normal range of motion. She exhibits no edema or tenderness.  Neurological: She is alert and oriented to person, place, and time. No cranial nerve deficit.  Skin: No rash noted. No erythema.  Psychiatric: Affect normal.   LABORATORY PANEL:   CBC  Recent Labs Lab 06/20/16 0516  WBC 10.7  HGB 7.8*  HCT 23.3*  PLT 118*   ------------------------------------------------------------------------------------------------------------------ Chemistries   Recent Labs Lab 06/19/16 0527 06/20/16 0516  NA 138 137  K 3.3* 3.6  CL 109 110  CO2 24 23  GLUCOSE 122* 130*  BUN 9 8  CREATININE 0.53 0.42*  CALCIUM 8.1* 7.9*  MG 1.7  --   AST 19 63*  ALT 14 54  ALKPHOS 105 208*  BILITOT 1.3* 4.7*   RADIOLOGY:  No results found. ASSESSMENT AND PLAN:   Acute cholecystitis with leukocytosis,  Calculus of bile duct without cholangitis or cholecystitis without obstruction per Dr. Allen Norris. S/p ERCP, A biliary sphincterotomy was performed.  Continue IV fluids and IV rocephin. Zofran and Morphine IV as needed. Leukocytosis improved. eventually she'll need to have laparoscopic cholecystectomy possibly as outpatient per Dr. Tamala Julian.  Elevated bilirubin and LFT, bilirubin up to 4.7 today, no jaundice.  F/u Dr. Tamala Julian and LFT.  Elevated troponin Per Dr. Clayborn Bigness, not recommend any further intervention to modify her risk Doubt non-STEMI borderline troponins probably demand ischemia.Continue Lipitor for lipid management. Echo: Normal study. No cardiac source of emboli was indentified.   normal LVF.    Breast cancer of upper-inner quadrant of left female breast ([facility]). Follow-up oncologist as outpatient.  Hypokalemia. Give potassium supplement. Improved. Anemia of chronic disease. Hb is low  but stable. No bleeding.  Discussed with Dr. Tamala Julian. All the records are reviewed and case discussed with Care Management/Social Worker. Management plans discussed with the patient, her son and  they are in agreement.  CODE STATUS: DNR.  TOTAL TIME TAKING CARE OF THIS PATIENT: 36 minutes.   More than 50% of the time was spent in counseling/coordination of care: YES  POSSIBLE D/C IN 2 DAYS, DEPENDING ON CLINICAL CONDITION.   Demetrios Loll M.D on 06/20/2016 at 1:49 PM  Between 7am to 6pm - Pager - [phone]  After 6pm go to [email or web address] - Proofreader  [facility]  Office  [phone]  CC: Primary care physician; WHITE, [name], NP  Note: This dictation was prepared with Dragon dictation along with smaller phrase technology. Any transcriptional errors that result from this process are unintentional.

## 2016-06-20 NOTE — Progress Notes (Signed)
She came into the hospital with epigastric pain and had CT findings of gallstones. Her bilirubin increased to 1.9. MRCP showed what appeared to be a small common duct stone and some dilatation of her bile ducts. She had ERCP and sphincterotomy yesterday draining some sludge from the common duct. She had some pain after the ERCP and received 2 doses of morphine last night. Today she reports no abdominal pain. She has tolerated some clear liquids but does not taste good. She reports she is breathing satisfactorily. She is having good urine output moved her bowels 2 days ago and has done limited amount of walking to the bathroom.  As part of her past medical history is having chemotherapy for breast cancer. Her last dose of chemotherapy was on Friday before admission on Sunday  On examination pulse remains slightly elevated she is awake alert and oriented. Abdomen is soft flat and nontender.  Clinical data her albumin is low at 2.7. Total bilirubin is paradoxically high at 4.7 with direct bilirubin of 2.6. CBC was with a hemoglobin of 7.8, white blood count down to 10.7 and platelet count decreased to 118,000.  Impression cholelithiasis, choledocholithiasis, hyperbilirubinemia  I discussed with her its unclear why her bilirubin would be elevated today. She is however symptomatically improved. Will offer full liquid diet and anticipate repeating liver panel tomorrow, consult gastroenterology about elevated bilirubin.  I discussed with her that eventually she'll need to have laparoscopic cholecystectomy possibly as outpatient

## 2016-06-21 DIAGNOSIS — K8070 Calculus of gallbladder and bile duct without cholecystitis without obstruction: Secondary | ICD-10-CM

## 2016-06-21 LAB — HEPATIC FUNCTION PANEL
ALBUMIN: 2.8 g/dL — AB (ref 3.5–5.0)
ALK PHOS: 256 U/L — AB (ref 38–126)
ALT: 69 U/L — AB (ref 14–54)
AST: 71 U/L — AB (ref 15–41)
BILIRUBIN TOTAL: 2.8 mg/dL — AB (ref 0.3–1.2)
Bilirubin, Direct: 1.4 mg/dL — ABNORMAL HIGH (ref 0.1–0.5)
Indirect Bilirubin: 1.4 mg/dL — ABNORMAL HIGH (ref 0.3–0.9)
TOTAL PROTEIN: 5.2 g/dL — AB (ref 6.5–8.1)

## 2016-06-21 LAB — HEMOGLOBIN: HEMOGLOBIN: 7.9 g/dL — AB (ref 12.0–16.0)

## 2016-06-21 NOTE — Progress Notes (Signed)
[place] at [place] NAME: Erica Levy    MR#:  [id number]  DATE OF BIRTH:  03/17/1941  SUBJECTIVE:  CHIEF COMPLAINT:   Chief Complaint  Patient presents with  . Abdominal Pain   - Admitted with cholecystitis and choledocholithiasis. Status post ERCP and Sphrinterotomy - lfts still elevated but bili trending down  REVIEW OF SYSTEMS:  Review of Systems  Constitutional: Positive for malaise/fatigue. Negative for chills and fever.  HENT: Negative for ear discharge, ear pain, hearing loss and sore throat.   Eyes: Negative for blurred vision and double vision.  Respiratory: Negative for cough, shortness of breath and wheezing.   Cardiovascular: Negative for chest pain, palpitations and leg swelling.  Gastrointestinal: Positive for abdominal pain and nausea. Negative for constipation, diarrhea and vomiting.  Genitourinary: Negative for dysuria.  Musculoskeletal: Negative for myalgias.  Neurological: Negative for dizziness, speech change, focal weakness, seizures and headaches.    DRUG ALLERGIES:  No Known Allergies  VITALS:  Blood pressure (!) 119/53, pulse 98, temperature 98.2 F (36.8 C), temperature source Oral, resp. rate (!) 23, height 5\' 2"  (1.575 m), weight 70.9 kg (156 lb 6.4 oz), SpO2 98 %.  PHYSICAL EXAMINATION:  Physical Exam  GENERAL:  75 y.o.-year-old patient lying in the bed with no acute distress.  EYES: Pupils equal, round, reactive to light and accommodation. No scleral icterus. Extraocular muscles intact.  HEENT: Head atraumatic, normocephalic. Oropharynx and nasopharynx clear.  NECK:  Supple, no jugular venous distention. No thyroid enlargement, no tenderness.  LUNGS: Normal breath sounds bilaterally, no wheezing, rales,rhonchi or crepitation. No use of accessory muscles of respiration. Diminished breath sounds at the bases CARDIOVASCULAR: S1, S2 normal. No murmurs, rubs, or gallops.  ABDOMEN: Soft, nontender,  no right upper quadrant abdominal pain on palpation, nondistended. Bowel sounds present. No organomegaly or mass.  EXTREMITIES: No pedal edema, cyanosis, or clubbing.  NEUROLOGIC: Cranial nerves II through XII are intact. Muscle strength 5/5 in all extremities. Sensation intact. Gait not checked.  PSYCHIATRIC: The patient is alert and oriented x 3.  SKIN: No obvious rash, lesion, or ulcer.    LABORATORY PANEL:   CBC  Recent Labs Lab 06/20/16 0516 06/21/16 0451  WBC 10.7  --   HGB 7.8* 7.9*  HCT 23.3*  --   PLT 118*  --    ------------------------------------------------------------------------------------------------------------------  Chemistries   Recent Labs Lab 06/19/16 0527 06/20/16 0516 06/21/16 0451  NA 138 137  --   K 3.3* 3.6  --   CL 109 110  --   CO2 24 23  --   GLUCOSE 122* 130*  --   BUN 9 8  --   CREATININE 0.53 0.42*  --   CALCIUM 8.1* 7.9*  --   MG 1.7  --   --   AST 19 63* 71*  ALT 14 54 69*  ALKPHOS 105 208* 256*  BILITOT 1.3* 4.7* 2.8*   ------------------------------------------------------------------------------------------------------------------  Cardiac Enzymes  Recent Labs Lab 06/18/16 0859  TROPONINI 0.04*   ------------------------------------------------------------------------------------------------------------------  RADIOLOGY:  No results found.  EKG:   Orders placed or performed during the hospital encounter of 06/17/16  . ED EKG  . ED EKG    ASSESSMENT AND PLAN:   NA 29-year-old female with past medical history significant for hypertension, CVA, breast cancer diagnosed in May 2017 on chemotherapy currently presents to hospital secondary to worsening abdominal pain and nausea and noted to have cholecystitis and choledocholithiasis.  #1 acute cholecystitis-no  pain currently. Symptoms several salt. -Appreciate surgical consult. Advance diet. -Possible cholecystectomy as outpatient if symptoms resolve. - on ABX-  rocephin - hold statin with elevated lfts for now  #2 choledocholithiasis with obstructive jaundice-appreciate GI consult. Status post ERCP and sphincterotomy - bili is improving, lfts are still elevated - monitor for another day - advance diet as tolerated  #3  Anemia- acute on chronic- dilutional and also on chemo, hb since chemo was started around 8 - monitor for need for Tx, no indication as long as >7.5 or symptomatic - some dyspnea on exertion noed- feels better today- monitor and Tx if <7.5 or if surgery planned.  #4 Hypothyroidism- on synthroid  #5 DVT Prophylaxis- on lovenox   All the records are reviewed and case discussed with Care Management/Social Workerr. Management plans discussed with the patient, family and they are in agreement.  CODE STATUS: DNR  TOTAL TIME TAKING CARE OF THIS PATIENT: 37 minutes.   POSSIBLE D/C IN 1-2 DAYS, DEPENDING ON CLINICAL CONDITION.   Gladstone Lighter M.D on 06/21/2016 at 9:13 AM  Between 7am to 6pm - Pager - [phone]  After 6pm go to [email or web address] - Proofreader  [facility]  Office  [phone]  CC: Primary care physician; WHITE, [name], NP

## 2016-06-21 NOTE — Progress Notes (Signed)
AVSS. Reports recurrent pain 2 AM reminiscent of pain that brought her to the hospital five days ago. Resolved w/ IV MS. Weak, SOB with walking to bathroom. Lungs: Clear. Cardio: RR. ABD: Non-distended, soft. NO focal tenderness. Extrem: Soft.  LFT's trending down. U/S, CT, MRI reports reviewed with report of GB distension, possible mild wall thickening, inflammation. No indication for surgery at this time.  Low HGB seems to be symptomatic, as patient reporting SOB w/ minimal activity.  May need TX prior to any surgical intervention.

## 2016-06-22 LAB — COMPREHENSIVE METABOLIC PANEL
ALT: 53 U/L (ref 14–54)
ANION GAP: 7 (ref 5–15)
AST: 45 U/L — ABNORMAL HIGH (ref 15–41)
Albumin: 3 g/dL — ABNORMAL LOW (ref 3.5–5.0)
Alkaline Phosphatase: 249 U/L — ABNORMAL HIGH (ref 38–126)
BILIRUBIN TOTAL: 1 mg/dL (ref 0.3–1.2)
CHLORIDE: 108 mmol/L (ref 101–111)
CO2: 25 mmol/L (ref 22–32)
Calcium: 8.6 mg/dL — ABNORMAL LOW (ref 8.9–10.3)
Creatinine, Ser: 0.55 mg/dL (ref 0.44–1.00)
Glucose, Bld: 127 mg/dL — ABNORMAL HIGH (ref 65–99)
POTASSIUM: 3.2 mmol/L — AB (ref 3.5–5.1)
Sodium: 140 mmol/L (ref 135–145)
TOTAL PROTEIN: 5.4 g/dL — AB (ref 6.5–8.1)

## 2016-06-22 LAB — CBC
HEMATOCRIT: 24.4 % — AB (ref 35.0–47.0)
Hemoglobin: 8.3 g/dL — ABNORMAL LOW (ref 12.0–16.0)
MCH: 34.2 pg — ABNORMAL HIGH (ref 26.0–34.0)
MCHC: 34.1 g/dL (ref 32.0–36.0)
MCV: 100.2 fL — AB (ref 80.0–100.0)
PLATELETS: 130 10*3/uL — AB (ref 150–440)
RBC: 2.44 MIL/uL — ABNORMAL LOW (ref 3.80–5.20)
RDW: 17.6 % — AB (ref 11.5–14.5)
WBC: 10.5 10*3/uL (ref 3.6–11.0)

## 2016-06-22 MED ORDER — HYDROCODONE-ACETAMINOPHEN 5-325 MG PO TABS: 1.0000 | ORAL_TABLET | Freq: Four times a day (QID) | ORAL | 0 refills | Status: DC | PRN

## 2016-06-22 MED ORDER — ONDANSETRON 4 MG PO TBDP: 4.0000 mg | ORAL_TABLET | Freq: Three times a day (TID) | ORAL | 0 refills | Status: AC | PRN

## 2016-06-22 MED ORDER — POTASSIUM CHLORIDE CRYS ER 20 MEQ PO TBCR
40.0000 meq | EXTENDED_RELEASE_TABLET | Freq: Once | ORAL | Status: AC
  Administered 2016-06-22: 40 meq via ORAL
  Filled 2016-06-22: qty 2

## 2016-06-22 MED ORDER — AMOXICILLIN-POT CLAVULANATE 875-125 MG PO TABS: 1.0000 | ORAL_TABLET | Freq: Two times a day (BID) | ORAL | 0 refills | Status: DC

## 2016-06-22 NOTE — Progress Notes (Signed)
AVSS. Pain free. Tolerating a regular diet well. ABD: Soft. Lungs:Clear. Bili: Normal. CBC: HGB up at 8.3, WBC normal. IMP: Doing well. OK for discharge w/ outpatient f/u w. Dr. Tamala Julian for elective cholecystectomy prior to next chemo treatment.

## 2016-06-22 NOTE — Care Management Important Message (Signed)
Important Message  Patient Details  Name: Erica Levy MRN: [id number] Date of Birth: Dec 21, 1940   Medicare Important Message Given:  Yes    Shelbie Ammons, RN 06/22/2016, 8:28 AM

## 2016-06-22 NOTE — Discharge Instructions (Signed)
Cholecystitis Introduction Cholecystitis is swelling and irritation (inflammation) of the gallbladder. The gallbladder is an organ that is shaped like a pear. It is under the liver on the right side of the body. This condition is often caused by gallstones. You doctor may do tests to see how your gallbladder works. These tests may include:  Imaging tests, such as:  An ultrasound.  MRI.  Tests that check how your liver works. This condition needs treatment. Follow these instructions at home: Home care will depend on your treatment. In general:  Take over-the-counter and prescription medicines only as told by your doctor.  If you were prescribed an antibiotic medicine, take it as told by your doctor. Do not stop taking the antibiotic even if you start to feel better.  Follow instructions from your doctor about what to eat or drink. When you are allowed to eat, avoid eating or drinking anything that causes your symptoms to start.  Keep all follow-up visits as told by your doctor. This is important. Contact a doctor if:  You have pain and your medicine does not help.  You have a fever. Get help right away if:  Your pain moves to:  Another part of your belly (abdomen).  Your back.  Your symptoms do not go away.  You have new symptoms. This information is not intended to replace advice given to you by your health care provider. Make sure you discuss any questions you have with your health care provider. Document Released: 07/05/2011 Document Revised: 12/22/2015 Document Reviewed: 10/27/2014  2017 Elsevier

## 2016-06-22 NOTE — Progress Notes (Signed)
Discharge paperwork reviewed including medication changes and new prescription with patient who verbalized understanding. Prescription for Vicodin attached to paperwork, other new prescriptions sent electronically to [facility] in [place]. Patient to follow up with Dr. Tamala Julian for elective cholecystectomy prior to next chemo treatment. Patient's daughter to transport home.

## 2016-06-23 NOTE — Discharge Summary (Signed)
[place] at [place] NAME: Erica Levy    MR#:  [id number]  [place] OF BIRTH:  10/02/1940  DATE OF ADMISSION:  06/17/2016   ADMITTING PHYSICIAN: Idelle Crouch, MD  DATE OF DISCHARGE: 06/22/2016  9:56 AM  PRIMARY CARE PHYSICIAN: WHITE, [name], NP   ADMISSION DIAGNOSIS:   Cholecystitis [K81.9] Epigastric pain [R10.13]  DISCHARGE DIAGNOSIS:   Principal Problem:   Cholecystitis, acute Active Problems:   Breast cancer of upper-inner quadrant of left female breast ([facility])   Bilateral upper abdominal pain   Elevated troponin   Acute cholecystitis   SECONDARY DIAGNOSIS:   Past Medical History:  Diagnosis Date  . Anemia   . Anemia due to chemotherapy   . Cancer ([facility])    breast  . Generalized OA   . GERD (gastroesophageal reflux disease)   . Hypercholesteremia   . Hypertension   . Hypothyroid   . Migraine   . Osteoporosis   . Stroke ([facility])   . TIA (transient ischemic attack)    July    HOSPITAL COURSE:   75 year old female with past medical history significant for hypertension, CVA, breast cancer diagnosed in May 2017 on chemotherapy currently presents to hospital secondary to worsening abdominal pain and nausea and noted to have cholecystitis and choledocholithiasis.  #1 acute cholecystitis-no pain currently. Symptoms resolved. -Appreciate surgical consult.tolerating soft diet. -Possible cholecystectomy as outpatient. - on ABX- rocephin in hospital- being discharged on augmentin - hold statin with elevated lfts for now  #2 choledocholithiasis with obstructive jaundice-appreciate GI consult. Status post ERCP and sphincterotomy - bili is improving, lfts improving as well - tolerating diet  #3  Anemia- acute on chronic- dilutional and also on chemo, hb since chemo was started around 8 - monitor for need for Tx, no indication as long as >7.5 or symptomatic - Outpatient oncology follow-up recommended  #4  Hypothyroidism- on synthroid  #5 breast cancer-follows up with cancer center. Currently on chemotherapy.  Stable medically and is being discharged home today.   DISCHARGE CONDITIONS:   Guarded  CONSULTS OBTAINED:   Treatment Team:  Lucilla Lame, MD  DRUG ALLERGIES:   No Known Allergies DISCHARGE MEDICATIONS:     Medication List    STOP taking these medications   acetaminophen 500 MG tablet Commonly known as:  TYLENOL   atorvastatin 80 MG tablet Commonly known as:  LIPITOR   potassium chloride 20 MEQ packet Commonly known as:  KLOR-CON   Vitamin D (Ergocalciferol) 50000 units Caps capsule Commonly known as:  DRISDOL     TAKE these medications   amoxicillin-clavulanate 875-125 MG tablet Commonly known as:  AUGMENTIN Take 1 tablet by mouth 2 (two) times daily. X 3 more days   HYDROcodone-acetaminophen 5-325 MG tablet Commonly known as:  NORCO/VICODIN Take 1-2 tablets by mouth every 6 (six) hours as needed for moderate pain.   levothyroxine 50 MCG tablet Commonly known as:  SYNTHROID, LEVOTHROID Take 1 tablet by mouth daily.   ondansetron 4 MG disintegrating tablet Commonly known as:  ZOFRAN ODT Take 1 tablet (4 mg total) by mouth every 8 (eight) hours as needed for nausea or vomiting.   potassium chloride SA 20 MEQ tablet Commonly known as:  K-DUR,KLOR-CON Take 20 mEq by mouth 2 (two) times daily.   Vitamin D3 5000 units Caps Take 1 capsule by mouth every other day.        DISCHARGE INSTRUCTIONS:   1. PCP f/u in 1-2 weeks 2.  F/u with Surgery Dr. Rochel Brome in 1-2 weeks 3. Oncology f/u for chemo in 1-2 weeks  DIET:   Cardiac diet  ACTIVITY:   Activity as tolerated  OXYGEN:   Home Oxygen: No.  Oxygen Delivery: room air  DISCHARGE LOCATION:   home   If you experience worsening of your admission symptoms, develop shortness of breath, life threatening emergency, suicidal or homicidal thoughts you must seek medical attention immediately  by calling 911 or calling your MD immediately  if symptoms less severe.  You Must read complete instructions/literature along with all the possible adverse reactions/side effects for all the Medicines you take and that have been prescribed to you. Take any new Medicines after you have completely understood and accpet all the possible adverse reactions/side effects.   Please note  You were cared for by a hospitalist during your hospital stay. If you have any questions about your discharge medications or the care you received while you were in the hospital after you are discharged, you can call the unit and asked to speak with the hospitalist on call if the hospitalist that took care of you is not available. Once you are discharged, your primary care physician will handle any further medical issues. Please note that NO REFILLS for any discharge medications will be authorized once you are discharged, as it is imperative that you return to your primary care physician (or establish a relationship with a primary care physician if you do not have one) for your aftercare needs so that they can reassess your need for medications and monitor your lab values.    On the day of Discharge:  VITAL SIGNS:   Blood pressure (!) 117/54, pulse 99, temperature 97.9 F (36.6 C), temperature source Oral, resp. rate 19, height 5\' 2"  (1.575 m), weight 71.1 kg (156 lb 12.8 oz), SpO2 97 %.  PHYSICAL EXAMINATION:    GENERAL:  75 y.o.-year-old patient lying in the bed with no acute distress.  EYES: Pupils equal, round, reactive to light and accommodation. No scleral icterus. Extraocular muscles intact.  HEENT: Head atraumatic, normocephalic. Oropharynx and nasopharynx clear.  NECK:  Supple, no jugular venous distention. No thyroid enlargement, no tenderness.  LUNGS: Normal breath sounds bilaterally, no wheezing, rales,rhonchi or crepitation. No use of accessory muscles of respiration. Diminished breath sounds at the  bases CARDIOVASCULAR: S1, S2 normal. No murmurs, rubs, or gallops.  ABDOMEN: Soft, nontender, no right upper quadrant abdominal pain on palpation, nondistended. Bowel sounds present. No organomegaly or mass.  EXTREMITIES: No pedal edema, cyanosis, or clubbing.  NEUROLOGIC: Cranial nerves II through XII are intact. Muscle strength 5/5 in all extremities. Sensation intact. Gait not checked.  PSYCHIATRIC: The patient is alert and oriented x 3.  SKIN: No obvious rash, lesion, or ulcer.   DATA REVIEW:   CBC  Recent Labs Lab 06/22/16 0529  WBC 10.5  HGB 8.3*  HCT 24.4*  PLT 130*    Chemistries   Recent Labs Lab 06/19/16 0527  06/22/16 0529  NA 138  < > 140  K 3.3*  < > 3.2*  CL 109  < > 108  CO2 24  < > 25  GLUCOSE 122*  < > 127*  BUN 9  < > <5*  CREATININE 0.53  < > 0.55  CALCIUM 8.1*  < > 8.6*  MG 1.7  --   --   AST 19  < > 45*  ALT 14  < > 53  ALKPHOS 105  < > 249*  BILITOT 1.3*  < > 1.0  < > = values in this interval not displayed.   Microbiology Results  No results found for this or any previous visit.  RADIOLOGY:  No results found.   Management plans discussed with the patient, family and they are in agreement.  CODE STATUS:  Code Status History    Date Active Date Inactive Code Status Order ID Comments User Context   06/20/2016  9:06 AM 06/22/2016  1:01 PM DNR [id number]  Demetrios Loll, MD Inpatient   06/17/2016  8:23 PM 06/20/2016  9:06 AM Full Code [id number]  Idelle Crouch, MD Inpatient   06/06/2016 12:18 PM 06/07/2016  5:40 PM Full Code [id number]  Lytle Butte, MD ED   01/13/2016  4:52 PM 01/14/2016  4:12 PM Full Code [id number]  Leonie Green, MD Inpatient   02/09/2015  2:48 PM 02/10/2015  8:32 PM Full Code [id number]  Dustin Flock, MD Inpatient    Questions for Most Recent Historical Code Status (Order [id number])    Question Answer Comment   In the event of cardiac or respiratory ARREST Do not call a "code blue"    In the event of cardiac or  respiratory ARREST Do not perform Intubation, CPR, defibrillation or ACLS    In the event of cardiac or respiratory ARREST Use medication by any route, position, wound care, and other measures to relive pain and suffering. May use oxygen, suction and manual treatment of airway obstruction as needed for comfort.       TOTAL TIME TAKING CARE OF THIS PATIENT: 37 minutes.    Gladstone Lighter M.D on 06/23/2016 at 12:16 PM  Between 7am to 6pm - Pager - [phone]  After 6pm go to [email or web address] - Proofreader  [facility]  Office  [phone]  CC: Primary care physician; WHITE, [name], NP   Note: This dictation was prepared with Dragon dictation along with smaller phrase technology. Any transcriptional errors that result from this process are unintentional.

## 2016-06-28 NOTE — Progress Notes (Signed)
.  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  INTERVAL HISTORY: Patient returns to clinic today for further evaluation and consideration of cycle 9 of 12 of weekly Taxol. She was recently admitted to the hospital again for gallstones, but did not require cholecystectomy. She continues to have persistent weakness and fatigue, but otherwise feels well. She denies any peripheral neuropathy or other neurologic complaints. She denies any recent fevers or illnesses. She has a good appetite and denies weight loss. She denies any pain. She denies any chest pain or shortness of breath. She denies any nausea, vomiting, constipation, or diarrhea. She has no urinary complaints. Patient offers no further specific complaints today.  REVIEW OF SYSTEMS:   Review of Systems  Constitutional: Positive for malaise/fatigue. Negative for fever and weight loss.  Respiratory: Negative.  Negative for cough and shortness of breath.   Cardiovascular: Negative.  Negative for chest pain.  Gastrointestinal: Negative.  Negative for abdominal pain.  Genitourinary: Negative.   Musculoskeletal: Negative.   Neurological: Positive for weakness.  Psychiatric/Behavioral: Negative.  The patient is not nervous/anxious.    As per HPI. Otherwise, a complete review of systems is negative.   PAST MEDICAL HISTORY: Past Medical History:  Diagnosis Date  . Anemia   . Anemia due to chemotherapy   . Cancer (HCC)    breast  . Generalized OA   . GERD (gastroesophageal reflux disease)   . Hypercholesteremia   . Hypertension   . Hypothyroid   . Migraine   . Osteoporosis   . Stroke ([place])   . TIA (transient  ischemic attack)    July    PAST SURGICAL HISTORY: Past Surgical History:  Procedure Laterality Date  . BREAST BIOPSY Left    bx/clip-neg  . BREAST BIOPSY Left 12/15/2015   path pending/us and stereo bx  . CATARACT EXTRACTION    . DILATION AND CURETTAGE OF UTERUS    . ENDOSCOPIC RETROGRADE CHOLANGIOPANCREATOGRAPHY (ERCP) WITH PROPOFOL N/A 06/19/2016   Procedure: ENDOSCOPIC RETROGRADE CHOLANGIOPANCREATOGRAPHY (ERCP) WITH PROPOFOL;  Surgeon: Lucilla Lame, MD;  Location: [facility] ENDOSCOPY;  Service: Endoscopy;  Laterality: N/A;  . EYE SURGERY Right    Cataract Extraction with IOL  . PARTIAL MASTECTOMY WITH AXILLARY SENTINEL LYMPH NODE BIOPSY Left 01/13/2016   Procedure: PARTIAL MASTECTOMY;  Surgeon: Leonie Green, MD;  Location: [facility] ORS;  Service: General;  Laterality: Left;  . PORTACATH PLACEMENT Right 01/13/2016   Procedure: INSERTION PORT-A-CATH;  Surgeon: Leonie Green, MD;  Location: [facility] ORS;  Service: General;  Laterality: Right;  . RETINAL DETACHMENT SURGERY Right    Dr. Starling Manns, [facility]  . TONSILLECTOMY      FAMILY HISTORY Family History  Problem Relation Age of Onset  . Hypertension Mother   . Osteoarthritis Mother   . Dementia Father   . Breast cancer Cousin 58    mat cousin       ADVANCED DIRECTIVES:    HEALTH MAINTENANCE: Social History  Substance Use Topics  . Smoking status: Never Smoker  . Smokeless tobacco: Never Used  . Alcohol use No     Colonoscopy:  PAP:  Bone density:  Lipid panel:  No Known Allergies  Current Outpatient Prescriptions  Medication Sig Dispense Refill  .  Cholecalciferol (VITAMIN D3) 5000 units CAPS Take 1 capsule by mouth every other day.    Marland Kitchen HYDROcodone-acetaminophen (NORCO/VICODIN) 5-325 MG tablet Take 1-2 tablets by mouth every 6 (six) hours as needed for moderate pain. 20 tablet 0  . levothyroxine (SYNTHROID, LEVOTHROID) 50 MCG tablet Take 1 tablet by mouth daily.  0  . ondansetron (ZOFRAN ODT) 4 MG  disintegrating tablet Take 1 tablet (4 mg total) by mouth every 8 (eight) hours as needed for nausea or vomiting. 20 tablet 0  . potassium chloride SA (K-DUR,KLOR-CON) 20 MEQ tablet Take 20 mEq by mouth 2 (two) times daily.     No current facility-administered medications for this visit.     OBJECTIVE: There were no vitals filed for this visit.   Body mass index is 26.94 kg/m.    ECOG FS:0 - Asymptomatic  General: Well-developed, well-nourished, no acute distress. Eyes: Pink conjunctiva, anicteric sclera. Breasts: Patient requested exam be deferred today. Lungs: Clear to auscultation bilaterally. Heart: Regular rate and rhythm. No rubs, murmurs, or gallops. Abdomen: Soft, nontender, nondistended. No organomegaly noted, normoactive bowel sounds. Musculoskeletal: No edema, cyanosis, or clubbing. Neuro: Alert, answering all questions appropriately. Cranial nerves grossly intact. Skin: No rashes or petechiae noted. Psych: Normal affect.   LAB RESULTS:  Lab Results  Component Value Date   NA 138 06/29/2016   K 4.0 06/29/2016   CL 106 06/29/2016   CO2 23 06/29/2016   GLUCOSE 119 (H) 06/29/2016   BUN 15 06/29/2016   CREATININE 0.72 06/29/2016   CALCIUM 8.8 (L) 06/29/2016   PROT 6.6 06/29/2016   ALBUMIN 3.7 06/29/2016   AST 30 06/29/2016   ALT 24 06/29/2016   ALKPHOS 158 (H) 06/29/2016   BILITOT 0.8 06/29/2016   GFRNONAA >60 06/29/2016   GFRAA >60 06/29/2016    Lab Results  Component Value Date   WBC 6.8 06/29/2016   NEUTROABS 5.0 06/29/2016   HGB 9.1 (L) 06/29/2016   HCT 27.1 (L) 06/29/2016   MCV 98.8 06/29/2016   PLT 163 06/29/2016     STUDIES: Dg Chest 2 View  Result Date: 06/06/2016 CLINICAL DATA:  The chest pain. History of breast malignancy, CVA, nonsmoker. EXAM: CHEST  2 VIEW COMPARISON:  Portable chest x-ray of January 13, 2016 FINDINGS: The lungs are well-expanded. There is no focal infiltrate. No pulmonary parenchymal nodules or masses are observed. The heart  and pulmonary vascularity are normal. There is calcification in the wall of the thoracic aorta. There is no pleural effusion or pneumothorax. The Port-A-Cath tip projects over the midportion of the SVC. The bony thorax exhibits no acute abnormality. IMPRESSION: There is no active cardiopulmonary disease. Aortic atherosclerosis. Electronically Signed   By: David  Martinique M.D.   On: 06/06/2016 11:01   Ct Abdomen Pelvis W Contrast  Result Date: 06/17/2016 CLINICAL DATA:  75 year old female with abdominal pain since 0230 hours today. Initial encounter. Left breast cancer currently on chemotherapy. EXAM: CT ABDOMEN AND PELVIS WITH CONTRAST TECHNIQUE: Multidetector CT imaging of the abdomen and pelvis was performed using the standard protocol following bolus administration of intravenous contrast. CONTRAST:  168mL ISOVUE-300 IOPAMIDOL (ISOVUE-300) INJECTION 61% COMPARISON:  CT Abdomen and Pelvis 04/2016. FINDINGS: Lower chest: Sequelae of left mastectomy again noted. No pericardial or pleural effusion. Negative lung bases; minor lower lobe atelectasis. No upper abdominal free air. Hepatobiliary: No liver mass. Mild intrahepatic biliary ductal dilatation is possible in the right lobe. The gallbladder is distended, 6 cm in diameter, and multiple gallstones are apparent. The CBD  is at the upper limits of normal. The gallbladder wall is mildly indistinct, but there is no confluent pericholecystic inflammation. Pancreas: Negative.  No pancreatic ductal dilatation identified. Spleen: Negative. Adrenals/Urinary Tract: Negative adrenal glands. Bilateral renal enhancement and contrast excretion is normal. Unremarkable urinary bladder. Stomach/Bowel: Decompressed distal colon. Decompressed left colon. Mild to moderate gas and retained stool throughout the transverse colon and right colon. Oral contrast has reached the ascending colon, nearly to the splenic flexure. Negative retrocecal appendix. Negative terminal ileum. No  dilated small bowel. Decompressed stomach and duodenum. Vascular/Lymphatic: Extensive Aortoiliac calcified atherosclerosis noted. Major arterial structures are patent. Portal venous system is patent. No lymphadenopathy. Reproductive: Negative. Other: No pelvic free fluid. Musculoskeletal: No acute or suspicious osseous lesion. IMPRESSION: 1. Dilated gallbladder with gallstones and suspicion of mild biliary ductal enlargement. Consider acute cholecystitis or early acute biliary obstruction. Recommend follow-up Right Upper Quadrant Ultrasound. 2. No other acute or inflammatory process identified in the abdomen or pelvis. No metastatic disease identified. 3.  Calcified aortic atherosclerosis. Electronically Signed   By: Genevie Ann M.D.   On: 06/17/2016 16:39   Mr Abdomen Mrcp Moise Boring Contast  Result Date: 06/18/2016 CLINICAL DATA:  75 year old female inpatient admitted with epigastric abdominal pain, found to have cholelithiasis and gallbladder wall thickening with dilated common bile duct on sonography. Elevated total bilirubin. EXAM: MRI ABDOMEN WITHOUT AND WITH CONTRAST (INCLUDING MRCP) TECHNIQUE: Multiplanar multisequence MR imaging of the abdomen was performed both before and after the administration of intravenous contrast. Heavily T2-weighted images of the biliary and pancreatic ducts were obtained, and three-dimensional MRCP images were rendered by post processing. CONTRAST:  31mL MULTIHANCE GADOBENATE DIMEGLUMINE 529 MG/ML IV SOLN COMPARISON:  06/17/2016 right upper quadrant abdominal sonogram and CT abdomen/pelvis. FINDINGS: Several of the sequences are motion degraded, particularly the postcontrast sequences. Lower chest: Clear lung bases. Hepatobiliary: Normal liver size and configuration. No hepatic steatosis. No liver mass. Distended gallbladder (5.8 cm diameter) contains numerous gallstones measuring up to 9 mm. Mild to moderate diffuse gallbladder wall thickening. Trace pericholecystic fluid. Gallstone  is seen within the cystic duct on the MRCP sequence. Mild diffuse intrahepatic biliary ductal dilatation. Common bile duct diameter 9 mm. There is a possible 3 mm stone in the lower third of the common bile duct on the slightly motion degraded MRCP sequence. No additional potential common bile duct stones. No biliary or ampullary mass. Pancreas: There is a unilocular 0.6 x 0.5 x 0.8 cm cystic pancreatic body lesion (series 5/ image 19 and series 8/image 42) without appreciable enhancement on the motion degraded postcontrast sequences. No additional pancreatic lesions. No pancreatic duct dilation. No evidence of pancreas divisum. Spleen: Normal size. No mass. Adrenals/Urinary Tract: Normal adrenals. No hydronephrosis. Normal kidneys with no renal mass. Stomach/Bowel: Grossly normal stomach. Visualized small and large bowel is normal caliber, with no bowel wall thickening. Vascular/Lymphatic: Atherosclerotic nonaneurysmal abdominal aorta. Patent portal, splenic, hepatic and renal veins. No pathologically enlarged lymph nodes in the abdomen. Other: No abdominal ascites or focal fluid collection. Musculoskeletal: No aggressive appearing focal osseous lesions. IMPRESSION: 1. Motion degraded scan. 2. Distended gallbladder with cholelithiasis, including gallstone in the cystic duct. Mild-to-moderate diffuse gallbladder wall thickening. Trace pericholecystic fluid. Acute cholecystitis cannot be excluded. 3. Mild diffuse intrahepatic biliary ductal dilatation. Dilated common bile duct (9 mm diameter). Questionable 3 mm stone in the lower third of the common bile duct. No biliary or ampullary mass. 4. Unilocular nonenhancing 0.8 cm cystic pancreatic body lesion. No pancreatic duct dilation. Recommend follow  up pre and post contrast MRI/MRCP or pancreatic protocol CT in 2 years. This recommendation follows ACR consensus guidelines: Management of Incidental Pancreatic Cysts: A White Paper of the ACR Incidental Findings  Committee. J Am Coll Radiol [id number]. 5. Aortic atherosclerosis. Electronically Signed   By: Ilona Sorrel M.D.   On: 06/18/2016 17:13   US Abdomen Limited Ruq  Result Date: 06/17/2016 CLINICAL DATA:  One day history of epigastric region pain EXAM: US ABDOMEN LIMITED - RIGHT UPPER QUADRANT COMPARISON:  CT abdomen and pelvis June 17, 2016 FINDINGS: Gallbladder: Gallbladder is distended. Multiple gallstones are noted in the gallbladder as well as mild sludge. Largest individual gallstone measures 8 mm in length. The gallbladder wall does not appear thickened, and there is no pericholecystic fluid. Gallbladder wall does appear subtly edematous, however. No sonographic Murphy sign noted by sonographer. Common bile duct: Diameter: 8 mm which is prominent. No biliary duct mass or calculus evident. Note that the distal common bile duct is obscured by gas. Liver: No focal lesion identified. Within normal limits in parenchymal echogenicity. IMPRESSION: Distended gallbladder with multiple gallstones and subtly edematous wall. These are findings concerning for a degree of acute cholecystitis. Common bile duct measures 8 mm which is prominent. No mass or calculus is seen in the biliary duct system. Note, however, that the distal common bile duct is obscured by gas. Electronically Signed   By: Lowella Grip III M.D.   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  PLAN:    1. 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 Pretreatment MUGA was 64.5%.  Patient completed 4 cycles of adjuvant Adriamycin and Cytoxan. Because patient had modified mastectomy, she will not require adjuvant XRT. Although  will further discuss with radiation oncology at the conclusion of her chemotherapy given her 2 positive axillary lymph nodes. An aromatase inhibitor would offer no benefit given the triple negative status of her disease. Proceed with cycle 9 of 12 of weekly Taxol today. Return to clinic in 1 week for consideration of cycle 10 and then in 2 and 3 weeks for further evaluation and consideration of cycles 11 and 12.  2.  Hypertension: Patient's blood pressure is elevated today. Monitor. Continue current medications. 3.  Anemia: Secondary chemotherapy, monitor. Patient's hemoglobin is decreased but improved since receiving transfusion last week in the hospital. Monitor.  4.  Hypokalemia: Patient's potassium is now within normal limits, continue oral supplementation. 5.  Leukopenia: Improved, monitor. 6.  Elevated bilirubin: Likely related to underlying gallstones which has now resolved. Bilirubin is now within normal limits. 7.  Elevated troponin: Likely secondary to cardiac strain, trending down. Follow-up with cardiology as recommended. 8. Gallstones: Patient is status post MRCP. Monitor.  Patient expressed understanding and was in agreement with this plan. She also understands that She can call clinic at any time with any questions, concerns, or complaints.   Breast cancer of upper-inner quadrant of left female breast [facility])   Staging form: Breast, AJCC 7th Edition     Pathologic stage from 01/06/2016: Stage IIA (T1c, N1a, M0) - Signed by Lloyd Huger, MD on 01/06/2016   Lloyd Huger, MD   07/03/2016 1:26 PM

## 2016-06-29 ENCOUNTER — Inpatient Hospital Stay: Payer: Medicare Other

## 2016-06-29 ENCOUNTER — Other Ambulatory Visit: Payer: Medicare Other

## 2016-06-29 ENCOUNTER — Inpatient Hospital Stay (HOSPITAL_BASED_OUTPATIENT_CLINIC_OR_DEPARTMENT_OTHER): Payer: Medicare Other | Admitting: Oncology

## 2016-06-29 ENCOUNTER — Ambulatory Visit: Payer: Medicare Other | Admitting: Oncology

## 2016-06-29 ENCOUNTER — Inpatient Hospital Stay: Payer: Medicare Other | Attending: Oncology

## 2016-06-29 VITALS — Wt 147.3 lb

## 2016-06-29 VITALS — BP 135/76 | HR 88 | Temp 98.6°F | Resp 20

## 2016-06-29 DIAGNOSIS — Z803 Family history of malignant neoplasm of breast: Secondary | ICD-10-CM | POA: Insufficient documentation

## 2016-06-29 DIAGNOSIS — Z9012 Acquired absence of left breast and nipple: Secondary | ICD-10-CM | POA: Diagnosis not present

## 2016-06-29 DIAGNOSIS — M1990 Unspecified osteoarthritis, unspecified site: Secondary | ICD-10-CM | POA: Insufficient documentation

## 2016-06-29 DIAGNOSIS — R531 Weakness: Secondary | ICD-10-CM | POA: Insufficient documentation

## 2016-06-29 DIAGNOSIS — Z5111 Encounter for antineoplastic chemotherapy: Secondary | ICD-10-CM | POA: Diagnosis not present

## 2016-06-29 DIAGNOSIS — C50212 Malignant neoplasm of upper-inner quadrant of left female breast: Secondary | ICD-10-CM

## 2016-06-29 DIAGNOSIS — R5383 Other fatigue: Secondary | ICD-10-CM | POA: Diagnosis not present

## 2016-06-29 DIAGNOSIS — Z79899 Other long term (current) drug therapy: Secondary | ICD-10-CM

## 2016-06-29 DIAGNOSIS — I700 Atherosclerosis of aorta: Secondary | ICD-10-CM

## 2016-06-29 DIAGNOSIS — M818 Other osteoporosis without current pathological fracture: Secondary | ICD-10-CM

## 2016-06-29 DIAGNOSIS — D72819 Decreased white blood cell count, unspecified: Secondary | ICD-10-CM

## 2016-06-29 DIAGNOSIS — I10 Essential (primary) hypertension: Secondary | ICD-10-CM | POA: Diagnosis not present

## 2016-06-29 DIAGNOSIS — C773 Secondary and unspecified malignant neoplasm of axilla and upper limb lymph nodes: Secondary | ICD-10-CM | POA: Insufficient documentation

## 2016-06-29 DIAGNOSIS — E876 Hypokalemia: Secondary | ICD-10-CM

## 2016-06-29 DIAGNOSIS — D6481 Anemia due to antineoplastic chemotherapy: Secondary | ICD-10-CM | POA: Insufficient documentation

## 2016-06-29 DIAGNOSIS — D0512 Intraductal carcinoma in situ of left breast: Secondary | ICD-10-CM

## 2016-06-29 DIAGNOSIS — K8080 Other cholelithiasis without obstruction: Secondary | ICD-10-CM | POA: Diagnosis not present

## 2016-06-29 DIAGNOSIS — E7800 Pure hypercholesterolemia, unspecified: Secondary | ICD-10-CM | POA: Insufficient documentation

## 2016-06-29 DIAGNOSIS — Z8673 Personal history of transient ischemic attack (TIA), and cerebral infarction without residual deficits: Secondary | ICD-10-CM | POA: Diagnosis not present

## 2016-06-29 DIAGNOSIS — K219 Gastro-esophageal reflux disease without esophagitis: Secondary | ICD-10-CM | POA: Diagnosis not present

## 2016-06-29 DIAGNOSIS — R7989 Other specified abnormal findings of blood chemistry: Secondary | ICD-10-CM

## 2016-06-29 DIAGNOSIS — Z171 Estrogen receptor negative status [ER-]: Secondary | ICD-10-CM

## 2016-06-29 DIAGNOSIS — E039 Hypothyroidism, unspecified: Secondary | ICD-10-CM | POA: Diagnosis not present

## 2016-06-29 LAB — COMPREHENSIVE METABOLIC PANEL
ALT: 24 U/L (ref 14–54)
ANION GAP: 9 (ref 5–15)
AST: 30 U/L (ref 15–41)
Albumin: 3.7 g/dL (ref 3.5–5.0)
Alkaline Phosphatase: 158 U/L — ABNORMAL HIGH (ref 38–126)
BUN: 15 mg/dL (ref 6–20)
CHLORIDE: 106 mmol/L (ref 101–111)
CO2: 23 mmol/L (ref 22–32)
Calcium: 8.8 mg/dL — ABNORMAL LOW (ref 8.9–10.3)
Creatinine, Ser: 0.72 mg/dL (ref 0.44–1.00)
GFR calc non Af Amer: >60 mL/min (ref >60)
Glucose, Bld: 119 mg/dL — ABNORMAL HIGH (ref 65–99)
Potassium: 4 mmol/L (ref 3.5–5.1)
SODIUM: 138 mmol/L (ref 135–145)
Total Bilirubin: 0.8 mg/dL (ref 0.3–1.2)
Total Protein: 6.6 g/dL (ref 6.5–8.1)

## 2016-06-29 LAB — CBC WITH DIFFERENTIAL/PLATELET
BASOS ABS: 0 10*3/uL (ref 0–0.1)
BASOS PCT: 1 %
Eosinophils Absolute: 0.1 10*3/uL (ref 0–0.7)
Eosinophils Relative: 1 %
HEMATOCRIT: 27.1 % — AB (ref 35.0–47.0)
HEMOGLOBIN: 9.1 g/dL — AB (ref 12.0–16.0)
LYMPHS PCT: 17 %
Lymphs Abs: 1.1 10*3/uL (ref 1.0–3.6)
MCH: 33.2 pg (ref 26.0–34.0)
MCHC: 33.6 g/dL (ref 32.0–36.0)
MCV: 98.8 fL (ref 80.0–100.0)
MONO ABS: 0.5 10*3/uL (ref 0.2–0.9)
Monocytes Relative: 8 %
NEUTROS ABS: 5 10*3/uL (ref 1.4–6.5)
NEUTROS PCT: 75 %
PLATELETS: 163 10*3/uL (ref 150–440)
RBC: 2.75 MIL/uL — AB (ref 3.80–5.20)
RDW: 17.2 % — ABNORMAL HIGH (ref 11.5–14.5)
WBC: 6.8 10*3/uL (ref 3.6–11.0)

## 2016-06-29 MED ORDER — FAMOTIDINE IN NACL 20-0.9 MG/50ML-% IV SOLN
20.0000 mg | Freq: Once | INTRAVENOUS | Status: AC
  Administered 2016-06-29: 20 mg via INTRAVENOUS
  Filled 2016-06-29: qty 50

## 2016-06-29 MED ORDER — DEXAMETHASONE SODIUM PHOSPHATE 10 MG/ML IJ SOLN
10.0000 mg | Freq: Once | INTRAMUSCULAR | Status: AC
  Administered 2016-06-29: 10 mg via INTRAVENOUS
  Filled 2016-06-29: qty 1

## 2016-06-29 MED ORDER — SODIUM CHLORIDE 0.9% FLUSH
10.0000 mL | INTRAVENOUS | Status: DC | PRN
  Administered 2016-06-29: 10 mL via INTRAVENOUS
  Filled 2016-06-29: qty 10

## 2016-06-29 MED ORDER — HEPARIN SOD (PORK) LOCK FLUSH 100 UNIT/ML IV SOLN
500.0000 [IU] | Freq: Once | INTRAVENOUS | Status: AC
  Administered 2016-06-29: 500 [IU] via INTRAVENOUS
  Filled 2016-06-29: qty 5

## 2016-06-29 MED ORDER — PACLITAXEL CHEMO INJECTION 300 MG/50ML
80.0000 mg/m2 | Freq: Once | INTRAVENOUS | Status: AC
  Administered 2016-06-29: 138 mg via INTRAVENOUS
  Filled 2016-06-29: qty 23

## 2016-06-29 MED ORDER — SODIUM CHLORIDE 0.9 % IV SOLN
Freq: Once | INTRAVENOUS | Status: AC
  Administered 2016-06-29: 12:00:00 via INTRAVENOUS
  Filled 2016-06-29: qty 1000

## 2016-06-29 MED ORDER — DIPHENHYDRAMINE HCL 50 MG/ML IJ SOLN
25.0000 mg | Freq: Once | INTRAMUSCULAR | Status: AC
  Administered 2016-06-29: 25 mg via INTRAVENOUS
  Filled 2016-06-29: qty 1

## 2016-06-29 NOTE — Progress Notes (Signed)
Offers no complaints. Feeling well. 

## 2016-07-06 ENCOUNTER — Other Ambulatory Visit: Payer: Self-pay | Admitting: Oncology

## 2016-07-06 ENCOUNTER — Ambulatory Visit: Payer: Medicare Other

## 2016-07-06 ENCOUNTER — Inpatient Hospital Stay: Payer: Medicare Other

## 2016-07-06 VITALS — BP 135/72 | HR 86 | Temp 96.8°F | Resp 20

## 2016-07-06 DIAGNOSIS — C50212 Malignant neoplasm of upper-inner quadrant of left female breast: Secondary | ICD-10-CM | POA: Diagnosis not present

## 2016-07-06 DIAGNOSIS — Z171 Estrogen receptor negative status [ER-]: Secondary | ICD-10-CM

## 2016-07-06 LAB — CBC WITH DIFFERENTIAL/PLATELET
Basophils Absolute: 0 10*3/uL (ref 0–0.1)
Basophils Relative: 1 %
Eosinophils Absolute: 0.1 10*3/uL (ref 0–0.7)
Eosinophils Relative: 3 %
HEMATOCRIT: 25.8 % — AB (ref 35.0–47.0)
HEMOGLOBIN: 8.7 g/dL — AB (ref 12.0–16.0)
LYMPHS PCT: 34 %
Lymphs Abs: 1.1 10*3/uL (ref 1.0–3.6)
MCH: 32.5 pg (ref 26.0–34.0)
MCHC: 33.6 g/dL (ref 32.0–36.0)
MCV: 96.6 fL (ref 80.0–100.0)
Monocytes Absolute: 0.3 10*3/uL (ref 0.2–0.9)
Monocytes Relative: 9 %
NEUTROS PCT: 53 %
Neutro Abs: 1.7 10*3/uL (ref 1.4–6.5)
Platelets: 154 10*3/uL (ref 150–440)
RBC: 2.67 MIL/uL — AB (ref 3.80–5.20)
RDW: 16.5 % — ABNORMAL HIGH (ref 11.5–14.5)
WBC: 3.2 10*3/uL — AB (ref 3.6–11.0)

## 2016-07-06 LAB — COMPREHENSIVE METABOLIC PANEL
ALK PHOS: 138 U/L — AB (ref 38–126)
ALT: 24 U/L (ref 14–54)
AST: 30 U/L (ref 15–41)
Albumin: 3.7 g/dL (ref 3.5–5.0)
Anion gap: 9 (ref 5–15)
BUN: 16 mg/dL (ref 6–20)
CALCIUM: 9.1 mg/dL (ref 8.9–10.3)
CO2: 21 mmol/L — AB (ref 22–32)
CREATININE: 0.64 mg/dL (ref 0.44–1.00)
Chloride: 108 mmol/L (ref 101–111)
Glucose, Bld: 151 mg/dL — ABNORMAL HIGH (ref 65–99)
Potassium: 3.9 mmol/L (ref 3.5–5.1)
Sodium: 138 mmol/L (ref 135–145)
Total Bilirubin: 1.2 mg/dL (ref 0.3–1.2)
Total Protein: 6.4 g/dL — ABNORMAL LOW (ref 6.5–8.1)

## 2016-07-06 LAB — SAMPLE TO BLOOD BANK

## 2016-07-06 MED ORDER — PACLITAXEL CHEMO INJECTION 300 MG/50ML
80.0000 mg/m2 | Freq: Once | INTRAVENOUS | Status: AC
  Administered 2016-07-06: 138 mg via INTRAVENOUS
  Filled 2016-07-06: qty 23

## 2016-07-06 MED ORDER — HEPARIN SOD (PORK) LOCK FLUSH 100 UNIT/ML IV SOLN
500.0000 [IU] | Freq: Once | INTRAVENOUS | Status: AC | PRN
  Administered 2016-07-06: 500 [IU]

## 2016-07-06 MED ORDER — DIPHENHYDRAMINE HCL 50 MG/ML IJ SOLN
25.0000 mg | Freq: Once | INTRAMUSCULAR | Status: AC
  Administered 2016-07-06: 25 mg via INTRAVENOUS
  Filled 2016-07-06: qty 1

## 2016-07-06 MED ORDER — DEXAMETHASONE SODIUM PHOSPHATE 10 MG/ML IJ SOLN
10.0000 mg | Freq: Once | INTRAMUSCULAR | Status: AC
  Administered 2016-07-06: 10 mg via INTRAVENOUS
  Filled 2016-07-06: qty 1

## 2016-07-06 MED ORDER — FAMOTIDINE IN NACL 20-0.9 MG/50ML-% IV SOLN
20.0000 mg | Freq: Once | INTRAVENOUS | Status: AC
  Administered 2016-07-06: 20 mg via INTRAVENOUS
  Filled 2016-07-06: qty 50

## 2016-07-06 MED ORDER — SODIUM CHLORIDE 0.9 % IV SOLN: 10.0000 mg | Freq: Once | INTRAVENOUS | Status: DC

## 2016-07-06 MED ORDER — PACLITAXEL CHEMO INJECTION 300 MG/50ML: 80.0000 mg/m2 | Freq: Once | INTRAVENOUS | Status: DC

## 2016-07-06 MED ORDER — SODIUM CHLORIDE 0.9 % IV SOLN
Freq: Once | INTRAVENOUS | Status: AC
  Administered 2016-07-06: 11:00:00 via INTRAVENOUS
  Filled 2016-07-06: qty 1000

## 2016-07-06 MED ORDER — SODIUM CHLORIDE 0.9% FLUSH
10.0000 mL | INTRAVENOUS | Status: DC | PRN
  Administered 2016-07-06: 10 mL
  Filled 2016-07-06: qty 10

## 2016-07-11 NOTE — Progress Notes (Signed)
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  INTERVAL HISTORY: Patient returns to clinic today for further evaluation and consideration of cycle 11 of 12 of weekly Taxol. She is feeling much better from her hospital stay in November for gallstones and has a follow-up appointment next month. She continues to have some weakness and fatigue, but otherwise feels well. She denies any peripheral neuropathy or other neurologic complaints. She denies any recent fevers or illnesses. She has a good appetite and denies weight loss. She denies any pain. She denies any chest pain or shortness of breath. She denies any nausea, vomiting, constipation, or diarrhea. She has no urinary complaints. Patient offers no further specific complaints today.  REVIEW OF SYSTEMS:   Review of Systems  Constitutional: Positive for malaise/fatigue. Negative for fever and weight loss.  Respiratory: Negative.  Negative for cough and shortness of breath.   Cardiovascular: Negative.  Negative for chest pain.  Gastrointestinal: Negative.  Negative for abdominal pain.  Genitourinary: Negative.   Musculoskeletal: Negative.   Neurological: Positive for weakness.  Psychiatric/Behavioral: Negative.  The patient is not nervous/anxious.    As per HPI. Otherwise, a complete review of systems is negative.   PAST MEDICAL HISTORY: Past Medical History:  Diagnosis Date  . Anemia   . Anemia due to chemotherapy   . Cancer (HCC)    breast  . Generalized OA   . GERD (gastroesophageal reflux disease)   . Hypercholesteremia   . Hypertension   . Hypothyroid   . Migraine   . Osteoporosis   . Stroke ([place])   . TIA  (transient ischemic attack)    July    PAST SURGICAL HISTORY: Past Surgical History:  Procedure Laterality Date  . BREAST BIOPSY Left    bx/clip-neg  . BREAST BIOPSY Left 12/15/2015   path pending/us and stereo bx  . CATARACT EXTRACTION    . DILATION AND CURETTAGE OF UTERUS    . ENDOSCOPIC RETROGRADE CHOLANGIOPANCREATOGRAPHY (ERCP) WITH PROPOFOL N/A 06/19/2016   Procedure: ENDOSCOPIC RETROGRADE CHOLANGIOPANCREATOGRAPHY (ERCP) WITH PROPOFOL;  Surgeon: Lucilla Lame, MD;  Location: [facility] ENDOSCOPY;  Service: Endoscopy;  Laterality: N/A;  . EYE SURGERY Right    Cataract Extraction with IOL  . PARTIAL MASTECTOMY WITH AXILLARY SENTINEL LYMPH NODE BIOPSY Left 01/13/2016   Procedure: PARTIAL MASTECTOMY;  Surgeon: Leonie Green, MD;  Location: [facility] ORS;  Service: General;  Laterality: Left;  . PORTACATH PLACEMENT Right 01/13/2016   Procedure: INSERTION PORT-A-CATH;  Surgeon: Leonie Green, MD;  Location: [facility] ORS;  Service: General;  Laterality: Right;  . RETINAL DETACHMENT SURGERY Right    Dr. Starling Manns, [facility]  . TONSILLECTOMY      FAMILY HISTORY Family History  Problem Relation Age of Onset  . Hypertension Mother   . Osteoarthritis Mother   . Dementia Father   . Breast cancer Cousin 66    mat cousin       ADVANCED DIRECTIVES:    HEALTH MAINTENANCE: Social History  Substance Use Topics  . Smoking status: Never Smoker  . Smokeless tobacco: Never Used  . Alcohol use No     Colonoscopy:  PAP:  Bone density:  Lipid panel:  No Known Allergies  Current Outpatient Prescriptions  Medication  Sig Dispense Refill  . Cholecalciferol (VITAMIN D3) 5000 units CAPS Take 1 capsule by mouth every other day.    Marland Kitchen HYDROcodone-acetaminophen (NORCO/VICODIN) 5-325 MG tablet Take 1-2 tablets by mouth every 6 (six) hours as needed for moderate pain. 20 tablet 0  . levothyroxine (SYNTHROID, LEVOTHROID) 50 MCG tablet Take 1 tablet by mouth daily.  0  . ondansetron (ZOFRAN ODT) 4 MG  disintegrating tablet Take 1 tablet (4 mg total) by mouth every 8 (eight) hours as needed for nausea or vomiting. 20 tablet 0  . potassium chloride SA (K-DUR,KLOR-CON) 20 MEQ tablet Take 20 mEq by mouth 2 (two) times daily.     No current facility-administered medications for this visit.    Facility-Administered Medications Ordered in Other Visits  Medication Dose Route Frequency Provider Last Rate Last Dose  . heparin lock flush 100 unit/mL  500 Units Intravenous Once Lloyd Huger, MD      . PACLitaxel (TAXOL) 138 mg in dextrose 5 % 250 mL chemo infusion (</= 80mg /m2)  80 mg/m2 (Treatment Plan Recorded) Intravenous Once Lloyd Huger, MD 273 mL/hr at 07/13/16 1048 138 mg at 07/13/16 1048  . sodium chloride flush (NS) 0.9 % injection 10 mL  10 mL Intravenous PRN Lloyd Huger, MD   10 mL at 07/13/16 0913    OBJECTIVE: Vitals:   07/13/16 0933  BP: 140/71  Pulse: (!) 111  Resp: 18  Temp: 98 F (36.7 C)     Body mass index is 26.69 kg/m.    ECOG FS:0 - Asymptomatic  General: Well-developed, well-nourished, no acute distress. Eyes: Pink conjunctiva, anicteric sclera. Breasts: Patient requested exam be deferred today. Lungs: Clear to auscultation bilaterally. Heart: Regular rate and rhythm. No rubs, murmurs, or gallops. Abdomen: Soft, nontender, nondistended. No organomegaly noted, normoactive bowel sounds. Musculoskeletal: No edema, cyanosis, or clubbing. Neuro: Alert, answering all questions appropriately. Cranial nerves grossly intact. Skin: No rashes or petechiae noted. Psych: Normal affect.   LAB RESULTS:  Lab Results  Component Value Date   NA 135 07/13/2016   K 3.7 07/13/2016   CL 106 07/13/2016   CO2 22 07/13/2016   GLUCOSE 130 (H) 07/13/2016   BUN 11 07/13/2016   CREATININE 0.58 07/13/2016   CALCIUM 8.9 07/13/2016   PROT 6.3 (L) 07/13/2016   ALBUMIN 3.8 07/13/2016   AST 23 07/13/2016   ALT 19 07/13/2016   ALKPHOS 117 07/13/2016   BILITOT 1.1  07/13/2016   GFRNONAA >60 07/13/2016   GFRAA >60 07/13/2016    Lab Results  Component Value Date   WBC 2.4 (L) 07/13/2016   NEUTROABS 1.1 (L) 07/13/2016   HGB 8.7 (L) 07/13/2016   HCT 25.8 (L) 07/13/2016   MCV 95.4 07/13/2016   PLT 187 07/13/2016     STUDIES: Ct Abdomen Pelvis W Contrast  Result Date: 06/17/2016 CLINICAL DATA:  75 year old female with abdominal pain since 0230 hours today. Initial encounter. Left breast cancer currently on chemotherapy. EXAM: CT ABDOMEN AND PELVIS WITH CONTRAST TECHNIQUE: Multidetector CT imaging of the abdomen and pelvis was performed using the standard protocol following bolus administration of intravenous contrast. CONTRAST:  142mL ISOVUE-300 IOPAMIDOL (ISOVUE-300) INJECTION 61% COMPARISON:  CT Abdomen and Pelvis 04/2016. FINDINGS: Lower chest: Sequelae of left mastectomy again noted. No pericardial or pleural effusion. Negative lung bases; minor lower lobe atelectasis. No upper abdominal free air. Hepatobiliary: No liver mass. Mild intrahepatic biliary ductal dilatation is possible in the right lobe. The gallbladder is distended, 6 cm in diameter, and  multiple gallstones are apparent. The CBD is at the upper limits of normal. The gallbladder wall is mildly indistinct, but there is no confluent pericholecystic inflammation. Pancreas: Negative.  No pancreatic ductal dilatation identified. Spleen: Negative. Adrenals/Urinary Tract: Negative adrenal glands. Bilateral renal enhancement and contrast excretion is normal. Unremarkable urinary bladder. Stomach/Bowel: Decompressed distal colon. Decompressed left colon. Mild to moderate gas and retained stool throughout the transverse colon and right colon. Oral contrast has reached the ascending colon, nearly to the splenic flexure. Negative retrocecal appendix. Negative terminal ileum. No dilated small bowel. Decompressed stomach and duodenum. Vascular/Lymphatic: Extensive Aortoiliac calcified atherosclerosis noted.  Major arterial structures are patent. Portal venous system is patent. No lymphadenopathy. Reproductive: Negative. Other: No pelvic free fluid. Musculoskeletal: No acute or suspicious osseous lesion. IMPRESSION: 1. Dilated gallbladder with gallstones and suspicion of mild biliary ductal enlargement. Consider acute cholecystitis or early acute biliary obstruction. Recommend follow-up Right Upper Quadrant Ultrasound. 2. No other acute or inflammatory process identified in the abdomen or pelvis. No metastatic disease identified. 3.  Calcified aortic atherosclerosis. Electronically Signed   By: Genevie Ann M.D.   On: 06/17/2016 16:39   Mr Abdomen Mrcp Moise Boring Contast  Result Date: 06/18/2016 CLINICAL DATA:  75 year old female inpatient admitted with epigastric abdominal pain, found to have cholelithiasis and gallbladder wall thickening with dilated common bile duct on sonography. Elevated total bilirubin. EXAM: MRI ABDOMEN WITHOUT AND WITH CONTRAST (INCLUDING MRCP) TECHNIQUE: Multiplanar multisequence MR imaging of the abdomen was performed both before and after the administration of intravenous contrast. Heavily T2-weighted images of the biliary and pancreatic ducts were obtained, and three-dimensional MRCP images were rendered by post processing. CONTRAST:  32mL MULTIHANCE GADOBENATE DIMEGLUMINE 529 MG/ML IV SOLN COMPARISON:  06/17/2016 right upper quadrant abdominal sonogram and CT abdomen/pelvis. FINDINGS: Several of the sequences are motion degraded, particularly the postcontrast sequences. Lower chest: Clear lung bases. Hepatobiliary: Normal liver size and configuration. No hepatic steatosis. No liver mass. Distended gallbladder (5.8 cm diameter) contains numerous gallstones measuring up to 9 mm. Mild to moderate diffuse gallbladder wall thickening. Trace pericholecystic fluid. Gallstone is seen within the cystic duct on the MRCP sequence. Mild diffuse intrahepatic biliary ductal dilatation. Common bile duct diameter  9 mm. There is a possible 3 mm stone in the lower third of the common bile duct on the slightly motion degraded MRCP sequence. No additional potential common bile duct stones. No biliary or ampullary mass. Pancreas: There is a unilocular 0.6 x 0.5 x 0.8 cm cystic pancreatic body lesion (series 5/ image 19 and series 8/image 42) without appreciable enhancement on the motion degraded postcontrast sequences. No additional pancreatic lesions. No pancreatic duct dilation. No evidence of pancreas divisum. Spleen: Normal size. No mass. Adrenals/Urinary Tract: Normal adrenals. No hydronephrosis. Normal kidneys with no renal mass. Stomach/Bowel: Grossly normal stomach. Visualized small and large bowel is normal caliber, with no bowel wall thickening. Vascular/Lymphatic: Atherosclerotic nonaneurysmal abdominal aorta. Patent portal, splenic, hepatic and renal veins. No pathologically enlarged lymph nodes in the abdomen. Other: No abdominal ascites or focal fluid collection. Musculoskeletal: No aggressive appearing focal osseous lesions. IMPRESSION: 1. Motion degraded scan. 2. Distended gallbladder with cholelithiasis, including gallstone in the cystic duct. Mild-to-moderate diffuse gallbladder wall thickening. Trace pericholecystic fluid. Acute cholecystitis cannot be excluded. 3. Mild diffuse intrahepatic biliary ductal dilatation. Dilated common bile duct (9 mm diameter). Questionable 3 mm stone in the lower third of the common bile duct. No biliary or ampullary mass. 4. Unilocular nonenhancing 0.8 cm cystic pancreatic body lesion.  No pancreatic duct dilation. Recommend follow up pre and post contrast MRI/MRCP or pancreatic protocol CT in 2 years. This recommendation follows ACR consensus guidelines: Management of Incidental Pancreatic Cysts: A White Paper of the ACR Incidental Findings Committee. J Am Coll Radiol [id number]. 5. Aortic atherosclerosis. Electronically Signed   By: Ilona Sorrel M.D.   On: 06/18/2016 17:13    US Abdomen Limited Ruq  Result Date: 06/17/2016 CLINICAL DATA:  One day history of epigastric region pain EXAM: US ABDOMEN LIMITED - RIGHT UPPER QUADRANT COMPARISON:  CT abdomen and pelvis June 17, 2016 FINDINGS: Gallbladder: Gallbladder is distended. Multiple gallstones are noted in the gallbladder as well as mild sludge. Largest individual gallstone measures 8 mm in length. The gallbladder wall does not appear thickened, and there is no pericholecystic fluid. Gallbladder wall does appear subtly edematous, however. No sonographic Murphy sign noted by sonographer. Common bile duct: Diameter: 8 mm which is prominent. No biliary duct mass or calculus evident. Note that the distal common bile duct is obscured by gas. Liver: No focal lesion identified. Within normal limits in parenchymal echogenicity. IMPRESSION: Distended gallbladder with multiple gallstones and subtly edematous wall. These are findings concerning for a degree of acute cholecystitis. Common bile duct measures 8 mm which is prominent. No mass or calculus is seen in the biliary duct system. Note, however, that the distal common bile duct is obscured by gas. Electronically Signed   By: Lowella Grip III M.D.   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  PLAN:    1. 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 Pretreatment MUGA was 64.5%.  Patient completed 4 cycles of adjuvant Adriamycin and Cytoxan. Because patient had modified mastectomy, she will not require adjuvant XRT. Although will further discuss with radiation oncology at the conclusion of her chemotherapy given her 2 positive axillary lymph nodes. An aromatase  inhibitor would offer no benefit given the triple negative status of her disease. Proceed with cycle 11 of 12 of weekly Taxol today. Return to clinic in 1 week for consideration of cycle 12.  2.  Hypertension: Patient's blood pressure is stable. Monitor. Continue current medications. 3.  Anemia: Secondary chemotherapy, monitor. Patient's hemoglobin is decreased but stable. Had a transfusion while in the hospital in November. Monitor.  4.  Hypokalemia: Patient's potassium is now within normal limits, continue oral supplementation. 5.  Leukopenia: Monitor. ANC 1.1 today. Adequate to treat today. 6.  Elevated bilirubin: Resolved. Likely secondary to gallstones. 7.  Elevated troponin: While in hospital. Resolved. Follow-up with Cardiology.  8. Gallstones: Patient is status post MRCP. Monitor.  Patient expressed understanding and was in agreement with this plan. She also understands that She can call clinic at any time with any questions, concerns, or complaints.   Breast cancer of upper-inner quadrant of left female breast [facility])   Staging form: Breast, AJCC 7th Edition     Pathologic stage from 01/06/2016: Stage IIA (T1c, N1a, M0) - Signed by Lloyd Huger, MD on 01/06/2016  Faythe Casa, NP 07/13/2016 11:10 AM  Patient was seen and evaluated independently and I agree with the assessment and plan as dictated above. Patient will receive cycle 12 in [place] next week. She also reports  that she will have to undergo a cholecystectomy when she completes chemotherapy.  Lloyd Huger, MD 07/15/16 10:09 AM

## 2016-07-13 ENCOUNTER — Inpatient Hospital Stay: Payer: Medicare Other

## 2016-07-13 ENCOUNTER — Inpatient Hospital Stay (HOSPITAL_BASED_OUTPATIENT_CLINIC_OR_DEPARTMENT_OTHER): Payer: Medicare Other | Admitting: Oncology

## 2016-07-13 VITALS — BP 128/68 | HR 83 | Temp 97.8°F | Resp 20

## 2016-07-13 VITALS — BP 140/71 | HR 111 | Temp 98.0°F | Resp 18 | Wt 145.9 lb

## 2016-07-13 DIAGNOSIS — I10 Essential (primary) hypertension: Secondary | ICD-10-CM

## 2016-07-13 DIAGNOSIS — Z171 Estrogen receptor negative status [ER-]: Secondary | ICD-10-CM

## 2016-07-13 DIAGNOSIS — D0512 Intraductal carcinoma in situ of left breast: Secondary | ICD-10-CM | POA: Diagnosis not present

## 2016-07-13 DIAGNOSIS — Z803 Family history of malignant neoplasm of breast: Secondary | ICD-10-CM

## 2016-07-13 DIAGNOSIS — K219 Gastro-esophageal reflux disease without esophagitis: Secondary | ICD-10-CM

## 2016-07-13 DIAGNOSIS — R7989 Other specified abnormal findings of blood chemistry: Secondary | ICD-10-CM

## 2016-07-13 DIAGNOSIS — K8080 Other cholelithiasis without obstruction: Secondary | ICD-10-CM

## 2016-07-13 DIAGNOSIS — Z8673 Personal history of transient ischemic attack (TIA), and cerebral infarction without residual deficits: Secondary | ICD-10-CM

## 2016-07-13 DIAGNOSIS — C50212 Malignant neoplasm of upper-inner quadrant of left female breast: Secondary | ICD-10-CM

## 2016-07-13 DIAGNOSIS — C773 Secondary and unspecified malignant neoplasm of axilla and upper limb lymph nodes: Secondary | ICD-10-CM | POA: Diagnosis not present

## 2016-07-13 DIAGNOSIS — E039 Hypothyroidism, unspecified: Secondary | ICD-10-CM

## 2016-07-13 DIAGNOSIS — Z79899 Other long term (current) drug therapy: Secondary | ICD-10-CM

## 2016-07-13 DIAGNOSIS — M818 Other osteoporosis without current pathological fracture: Secondary | ICD-10-CM

## 2016-07-13 DIAGNOSIS — D6481 Anemia due to antineoplastic chemotherapy: Secondary | ICD-10-CM

## 2016-07-13 DIAGNOSIS — E876 Hypokalemia: Secondary | ICD-10-CM

## 2016-07-13 DIAGNOSIS — D72819 Decreased white blood cell count, unspecified: Secondary | ICD-10-CM

## 2016-07-13 DIAGNOSIS — E7800 Pure hypercholesterolemia, unspecified: Secondary | ICD-10-CM

## 2016-07-13 DIAGNOSIS — I700 Atherosclerosis of aorta: Secondary | ICD-10-CM

## 2016-07-13 DIAGNOSIS — M1990 Unspecified osteoarthritis, unspecified site: Secondary | ICD-10-CM

## 2016-07-13 DIAGNOSIS — R5383 Other fatigue: Secondary | ICD-10-CM

## 2016-07-13 DIAGNOSIS — Z9012 Acquired absence of left breast and nipple: Secondary | ICD-10-CM

## 2016-07-13 DIAGNOSIS — R531 Weakness: Secondary | ICD-10-CM

## 2016-07-13 LAB — CBC WITH DIFFERENTIAL/PLATELET
BASOS ABS: 0 10*3/uL (ref 0–0.1)
BASOS PCT: 1 %
EOS ABS: 0.1 10*3/uL (ref 0–0.7)
EOS PCT: 4 %
HCT: 25.8 % — ABNORMAL LOW (ref 35.0–47.0)
Hemoglobin: 8.7 g/dL — ABNORMAL LOW (ref 12.0–16.0)
Lymphocytes Relative: 42 %
Lymphs Abs: 1 10*3/uL (ref 1.0–3.6)
MCH: 32.1 pg (ref 26.0–34.0)
MCHC: 33.6 g/dL (ref 32.0–36.0)
MCV: 95.4 fL (ref 80.0–100.0)
MONO ABS: 0.1 10*3/uL — AB (ref 0.2–0.9)
MONOS PCT: 6 %
Neutro Abs: 1.1 10*3/uL — ABNORMAL LOW (ref 1.4–6.5)
Neutrophils Relative %: 47 %
PLATELETS: 187 10*3/uL (ref 150–440)
RBC: 2.7 MIL/uL — ABNORMAL LOW (ref 3.80–5.20)
RDW: 16 % — AB (ref 11.5–14.5)
WBC: 2.4 10*3/uL — ABNORMAL LOW (ref 3.6–11.0)

## 2016-07-13 LAB — COMPREHENSIVE METABOLIC PANEL
ALBUMIN: 3.8 g/dL (ref 3.5–5.0)
ALT: 19 U/L (ref 14–54)
ANION GAP: 7 (ref 5–15)
AST: 23 U/L (ref 15–41)
Alkaline Phosphatase: 117 U/L (ref 38–126)
BILIRUBIN TOTAL: 1.1 mg/dL (ref 0.3–1.2)
BUN: 11 mg/dL (ref 6–20)
CHLORIDE: 106 mmol/L (ref 101–111)
CO2: 22 mmol/L (ref 22–32)
Calcium: 8.9 mg/dL (ref 8.9–10.3)
Creatinine, Ser: 0.58 mg/dL (ref 0.44–1.00)
GFR calc Af Amer: >60 mL/min (ref >60)
GFR calc non Af Amer: >60 mL/min (ref >60)
GLUCOSE: 130 mg/dL — AB (ref 65–99)
POTASSIUM: 3.7 mmol/L (ref 3.5–5.1)
SODIUM: 135 mmol/L (ref 135–145)
TOTAL PROTEIN: 6.3 g/dL — AB (ref 6.5–8.1)

## 2016-07-13 LAB — SAMPLE TO BLOOD BANK

## 2016-07-13 MED ORDER — FAMOTIDINE IN NACL 20-0.9 MG/50ML-% IV SOLN
20.0000 mg | Freq: Once | INTRAVENOUS | Status: AC
  Administered 2016-07-13: 20 mg via INTRAVENOUS
  Filled 2016-07-13: qty 50

## 2016-07-13 MED ORDER — DIPHENHYDRAMINE HCL 50 MG/ML IJ SOLN
25.0000 mg | Freq: Once | INTRAMUSCULAR | Status: AC
  Administered 2016-07-13: 25 mg via INTRAVENOUS
  Filled 2016-07-13: qty 1

## 2016-07-13 MED ORDER — SODIUM CHLORIDE 0.9% FLUSH
10.0000 mL | INTRAVENOUS | Status: DC | PRN
  Administered 2016-07-13: 10 mL via INTRAVENOUS
  Filled 2016-07-13: qty 10

## 2016-07-13 MED ORDER — PACLITAXEL CHEMO INJECTION 300 MG/50ML
80.0000 mg/m2 | Freq: Once | INTRAVENOUS | Status: AC
  Administered 2016-07-13: 138 mg via INTRAVENOUS
  Filled 2016-07-13: qty 23

## 2016-07-13 MED ORDER — DEXAMETHASONE SODIUM PHOSPHATE 10 MG/ML IJ SOLN
10.0000 mg | Freq: Once | INTRAMUSCULAR | Status: AC
  Administered 2016-07-13: 10 mg via INTRAVENOUS
  Filled 2016-07-13: qty 1

## 2016-07-13 MED ORDER — SODIUM CHLORIDE 0.9 % IV SOLN
Freq: Once | INTRAVENOUS | Status: AC
  Administered 2016-07-13: 10:00:00 via INTRAVENOUS
  Filled 2016-07-13: qty 1000

## 2016-07-13 MED ORDER — HEPARIN SOD (PORK) LOCK FLUSH 100 UNIT/ML IV SOLN
500.0000 [IU] | Freq: Once | INTRAVENOUS | Status: AC
Start: 2016-07-13 — End: 2016-07-13
  Administered 2016-07-13: 500 [IU] via INTRAVENOUS
  Filled 2016-07-13: qty 5

## 2016-07-13 NOTE — Progress Notes (Signed)
Offers no complaints  

## 2016-07-13 NOTE — Progress Notes (Signed)
Okay to treat with ANC 1.1 per Nurse Practitioner

## 2016-07-18 NOTE — Progress Notes (Signed)
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  INTERVAL HISTORY: Patient returns to clinic today for further evaluation and consideration of cycle 12 of 12 of weekly Taxol. She continues to improve from her hospital stay in November for gallstones and has a follow-up appointment in January. She continues to have some weakness and fatigue, but otherwise feels well. She denies any peripheral neuropathy or other neurologic complaints. She denies any recent fevers or illnesses. She has a good appetite and denies weight loss. She denies any pain. She denies any chest pain or shortness of breath. She denies any nausea, vomiting, constipation, or diarrhea. She has no urinary complaints. Patient offers no further specific complaints today.  REVIEW OF SYSTEMS:   Review of Systems  Constitutional: Positive for malaise/fatigue. Negative for fever and weight loss.  Respiratory: Negative.  Negative for cough and shortness of breath.   Cardiovascular: Negative.  Negative for chest pain.  Gastrointestinal: Negative.  Negative for abdominal pain.  Genitourinary: Negative.   Musculoskeletal: Negative.   Neurological: Positive for weakness.  Psychiatric/Behavioral: Negative.  The patient is not nervous/anxious.    As per HPI. Otherwise, a complete review of systems is negative.   PAST MEDICAL HISTORY: Past Medical History:  Diagnosis Date  . Anemia   . Anemia due to chemotherapy   . Cancer (HCC)    breast  . Generalized OA   . GERD (gastroesophageal reflux disease)   . Hypercholesteremia   . Hypertension   . Hypothyroid   . Migraine   . Osteoporosis   . Stroke ([place])   . TIA  (transient ischemic attack)    July    PAST SURGICAL HISTORY: Past Surgical History:  Procedure Laterality Date  . BREAST BIOPSY Left    bx/clip-neg  . BREAST BIOPSY Left 12/15/2015   path pending/us and stereo bx  . CATARACT EXTRACTION    . DILATION AND CURETTAGE OF UTERUS    . ENDOSCOPIC RETROGRADE CHOLANGIOPANCREATOGRAPHY (ERCP) WITH PROPOFOL N/A 06/19/2016   Procedure: ENDOSCOPIC RETROGRADE CHOLANGIOPANCREATOGRAPHY (ERCP) WITH PROPOFOL;  Surgeon: Lucilla Lame, MD;  Location: [facility] ENDOSCOPY;  Service: Endoscopy;  Laterality: N/A;  . EYE SURGERY Right    Cataract Extraction with IOL  . PARTIAL MASTECTOMY WITH AXILLARY SENTINEL LYMPH NODE BIOPSY Left 01/13/2016   Procedure: PARTIAL MASTECTOMY;  Surgeon: Leonie Green, MD;  Location: [facility] ORS;  Service: General;  Laterality: Left;  . PORTACATH PLACEMENT Right 01/13/2016   Procedure: INSERTION PORT-A-CATH;  Surgeon: Leonie Green, MD;  Location: [facility] ORS;  Service: General;  Laterality: Right;  . RETINAL DETACHMENT SURGERY Right    Dr. Starling Manns, [facility]  . TONSILLECTOMY      FAMILY HISTORY Family History  Problem Relation Age of Onset  . Hypertension Mother   . Osteoarthritis Mother   . Dementia Father   . Breast cancer Cousin 53    mat cousin       ADVANCED DIRECTIVES:    HEALTH MAINTENANCE: Social History  Substance Use Topics  . Smoking status: Never Smoker  . Smokeless tobacco: Never Used  . Alcohol use No     Colonoscopy:  PAP:  Bone density:  Lipid panel:  No Known Allergies  Current Outpatient Prescriptions  Medication Sig  Dispense Refill  . Cholecalciferol (VITAMIN D3) 5000 units CAPS Take 1 capsule by mouth every other day.    Marland Kitchen HYDROcodone-acetaminophen (NORCO/VICODIN) 5-325 MG tablet Take 1-2 tablets by mouth every 6 (six) hours as needed for moderate pain. 20 tablet 0  . levothyroxine (SYNTHROID, LEVOTHROID) 50 MCG tablet Take 1 tablet by mouth daily.  0  . ondansetron (ZOFRAN ODT) 4 MG  disintegrating tablet Take 1 tablet (4 mg total) by mouth every 8 (eight) hours as needed for nausea or vomiting. 20 tablet 0  . potassium chloride SA (K-DUR,KLOR-CON) 20 MEQ tablet Take 20 mEq by mouth 2 (two) times daily.     No current facility-administered medications for this visit.    Facility-Administered Medications Ordered in Other Visits  Medication Dose Route Frequency Provider Last Rate Last Dose  . heparin lock flush 100 unit/mL  500 Units Intravenous Once Lloyd Huger, MD      . PACLitaxel (TAXOL) 138 mg in sodium chloride 0.9 % 250 mL chemo infusion (</= 80mg /m2)  80 mg/m2 (Treatment Plan Recorded) Intravenous Once Lloyd Huger, MD 273 mL/hr at 07/20/16 1142 138 mg at 07/20/16 1142    OBJECTIVE: Vitals:   07/20/16 1029  BP: (!) 158/71  Pulse: (!) 116  Resp: 18  Temp: 98.7 F (37.1 C)     Body mass index is 27.14 kg/m.    ECOG FS:0 - Asymptomatic  General: Well-developed, well-nourished, no acute distress. Eyes: Pink conjunctiva, anicteric sclera. Breasts: Patient requested exam be deferred today. Lungs: Clear to auscultation bilaterally. Heart: Regular rate and rhythm. No rubs, murmurs, or gallops. Abdomen: Soft, nontender, nondistended. No organomegaly noted, normoactive bowel sounds. Musculoskeletal: No edema, cyanosis, or clubbing. Neuro: Alert, answering all questions appropriately. Cranial nerves grossly intact. Skin: No rashes or petechiae noted. Psych: Normal affect.   LAB RESULTS:  Lab Results  Component Value Date   NA 135 07/20/2016   K 3.5 07/20/2016   CL 108 07/20/2016   CO2 22 07/20/2016   GLUCOSE 126 (H) 07/20/2016   BUN 10 07/20/2016   CREATININE 0.63 07/20/2016   CALCIUM 8.9 07/20/2016   PROT 6.4 (L) 07/20/2016   ALBUMIN 3.8 07/20/2016   AST 23 07/20/2016   ALT 17 07/20/2016   ALKPHOS 117 07/20/2016   BILITOT 1.0 07/20/2016   GFRNONAA >60 07/20/2016   GFRAA >60 07/20/2016    Lab Results  Component Value Date   WBC  2.3 (L) 07/20/2016   NEUTROABS 1.1 (L) 07/20/2016   HGB 9.3 (L) 07/20/2016   HCT 26.7 (L) 07/20/2016   MCV 94.1 07/20/2016   PLT 206 07/20/2016     STUDIES: No results found.  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  PLAN:    1. 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 Pretreatment MUGA was 64.5%.  Patient completed 4 cycles of adjuvant Adriamycin and Cytoxan. Because patient had modified mastectomy, she will not require adjuvant XRT. Although will further discuss with radiation oncology at the conclusion of her chemotherapy given her 2 positive axillary lymph nodes. An aromatase inhibitor would offer no benefit given the triple negative status of her disease. Proceed with cycle 12 of 12 of weekly Taxol today. Return to clinic in 3 months for repeat labs and further  evaluation. Will schedule her to see Dr. Baruch Gouty for consideration of radiation in the next 2-3 weeks.   2.  Hypertension: Patient's blood pressure is elevated, but stable. Monitor. Continue current medications. 3.  Anemia: Secondary chemotherapy, monitor. Patient's hemoglobin has increased slightly today.  Monitor.  4.  Hypokalemia: Patient's potassium is now within normal limits, continue oral supplementation. 5.  Leukopenia: Monitor. ANC 1.1 today. Adequate to treat today. 6.  Elevated bilirubin: Resolved. Likely secondary to gallstones. 7.  Elevated troponin: While in hospital. Resolved. Follow-up with Cardiology.  8.  Gallstones: Patient is status post MRCP. Monitor.  Patient expressed understanding and was in agreement with this plan. She also understands that She can call clinic at any time with any questions, concerns, or  complaints.   Breast cancer of upper-inner quadrant of left female breast [facility])   Staging form: Breast, AJCC 7th Edition     Pathologic stage from 01/06/2016: Stage IIA (T1c, N1a, M0) - Signed by Lloyd Huger, MD on 01/06/2016  Faythe Casa, NP 07/20/2016  Patient was seen and evaluated independently and I agree with the assessment and plan as dictated above. Patient may may require cholecystectomy upon completion of chemotherapy. Possible XRT as above.  Lloyd Huger, MD 07/25/16 1:41 PM

## 2016-07-20 ENCOUNTER — Inpatient Hospital Stay: Payer: Medicare Other

## 2016-07-20 ENCOUNTER — Inpatient Hospital Stay (HOSPITAL_BASED_OUTPATIENT_CLINIC_OR_DEPARTMENT_OTHER): Payer: Medicare Other | Admitting: Oncology

## 2016-07-20 VITALS — BP 158/71 | HR 116 | Temp 98.7°F | Resp 18 | Wt 148.4 lb

## 2016-07-20 DIAGNOSIS — I700 Atherosclerosis of aorta: Secondary | ICD-10-CM

## 2016-07-20 DIAGNOSIS — Z9012 Acquired absence of left breast and nipple: Secondary | ICD-10-CM

## 2016-07-20 DIAGNOSIS — R7989 Other specified abnormal findings of blood chemistry: Secondary | ICD-10-CM

## 2016-07-20 DIAGNOSIS — Z171 Estrogen receptor negative status [ER-]: Secondary | ICD-10-CM

## 2016-07-20 DIAGNOSIS — D72819 Decreased white blood cell count, unspecified: Secondary | ICD-10-CM

## 2016-07-20 DIAGNOSIS — C50212 Malignant neoplasm of upper-inner quadrant of left female breast: Secondary | ICD-10-CM

## 2016-07-20 DIAGNOSIS — I10 Essential (primary) hypertension: Secondary | ICD-10-CM

## 2016-07-20 DIAGNOSIS — C773 Secondary and unspecified malignant neoplasm of axilla and upper limb lymph nodes: Secondary | ICD-10-CM

## 2016-07-20 DIAGNOSIS — D0512 Intraductal carcinoma in situ of left breast: Secondary | ICD-10-CM | POA: Diagnosis not present

## 2016-07-20 DIAGNOSIS — Z79899 Other long term (current) drug therapy: Secondary | ICD-10-CM

## 2016-07-20 DIAGNOSIS — K8080 Other cholelithiasis without obstruction: Secondary | ICD-10-CM

## 2016-07-20 DIAGNOSIS — E039 Hypothyroidism, unspecified: Secondary | ICD-10-CM

## 2016-07-20 DIAGNOSIS — D6481 Anemia due to antineoplastic chemotherapy: Secondary | ICD-10-CM

## 2016-07-20 DIAGNOSIS — Z8673 Personal history of transient ischemic attack (TIA), and cerebral infarction without residual deficits: Secondary | ICD-10-CM

## 2016-07-20 DIAGNOSIS — Z803 Family history of malignant neoplasm of breast: Secondary | ICD-10-CM

## 2016-07-20 DIAGNOSIS — E7800 Pure hypercholesterolemia, unspecified: Secondary | ICD-10-CM

## 2016-07-20 DIAGNOSIS — K219 Gastro-esophageal reflux disease without esophagitis: Secondary | ICD-10-CM

## 2016-07-20 DIAGNOSIS — R531 Weakness: Secondary | ICD-10-CM

## 2016-07-20 DIAGNOSIS — M818 Other osteoporosis without current pathological fracture: Secondary | ICD-10-CM

## 2016-07-20 DIAGNOSIS — M1990 Unspecified osteoarthritis, unspecified site: Secondary | ICD-10-CM

## 2016-07-20 DIAGNOSIS — R5383 Other fatigue: Secondary | ICD-10-CM

## 2016-07-20 LAB — CBC WITH DIFFERENTIAL/PLATELET
Basophils Absolute: 0 10*3/uL (ref 0–0.1)
Basophils Relative: 1 %
Eosinophils Absolute: 0 10*3/uL (ref 0–0.7)
Eosinophils Relative: 1 %
HEMATOCRIT: 26.7 % — AB (ref 35.0–47.0)
HEMOGLOBIN: 9.3 g/dL — AB (ref 12.0–16.0)
LYMPHS PCT: 40 %
Lymphs Abs: 0.9 10*3/uL — ABNORMAL LOW (ref 1.0–3.6)
MCH: 32.9 pg (ref 26.0–34.0)
MCHC: 35 g/dL (ref 32.0–36.0)
MCV: 94.1 fL (ref 80.0–100.0)
MONO ABS: 0.2 10*3/uL (ref 0.2–0.9)
MONOS PCT: 8 %
NEUTROS ABS: 1.1 10*3/uL — AB (ref 1.4–6.5)
Neutrophils Relative %: 50 %
Platelets: 206 10*3/uL (ref 150–440)
RBC: 2.84 MIL/uL — ABNORMAL LOW (ref 3.80–5.20)
RDW: 16.6 % — AB (ref 11.5–14.5)
WBC: 2.3 10*3/uL — ABNORMAL LOW (ref 3.6–11.0)

## 2016-07-20 LAB — SAMPLE TO BLOOD BANK

## 2016-07-20 LAB — COMPREHENSIVE METABOLIC PANEL
ALK PHOS: 117 U/L (ref 38–126)
ALT: 17 U/L (ref 14–54)
ANION GAP: 5 (ref 5–15)
AST: 23 U/L (ref 15–41)
Albumin: 3.8 g/dL (ref 3.5–5.0)
BILIRUBIN TOTAL: 1 mg/dL (ref 0.3–1.2)
BUN: 10 mg/dL (ref 6–20)
CALCIUM: 8.9 mg/dL (ref 8.9–10.3)
CO2: 22 mmol/L (ref 22–32)
Chloride: 108 mmol/L (ref 101–111)
Creatinine, Ser: 0.63 mg/dL (ref 0.44–1.00)
GFR calc Af Amer: >60 mL/min (ref >60)
Glucose, Bld: 126 mg/dL — ABNORMAL HIGH (ref 65–99)
POTASSIUM: 3.5 mmol/L (ref 3.5–5.1)
Sodium: 135 mmol/L (ref 135–145)
TOTAL PROTEIN: 6.4 g/dL — AB (ref 6.5–8.1)

## 2016-07-20 MED ORDER — DIPHENHYDRAMINE HCL 50 MG/ML IJ SOLN
25.0000 mg | Freq: Once | INTRAMUSCULAR | Status: AC
  Administered 2016-07-20: 25 mg via INTRAVENOUS
  Filled 2016-07-20: qty 1

## 2016-07-20 MED ORDER — SODIUM CHLORIDE 0.9 % IJ SOLN
10.0000 mL | Freq: Once | INTRAMUSCULAR | Status: AC
  Administered 2016-07-20: 10 mL via INTRAVENOUS
  Filled 2016-07-20: qty 10

## 2016-07-20 MED ORDER — SODIUM CHLORIDE 0.9 % IV SOLN
Freq: Once | INTRAVENOUS | Status: AC
  Administered 2016-07-20: 11:00:00 via INTRAVENOUS
  Filled 2016-07-20: qty 1000

## 2016-07-20 MED ORDER — FAMOTIDINE IN NACL 20-0.9 MG/50ML-% IV SOLN
20.0000 mg | Freq: Once | INTRAVENOUS | Status: AC
  Administered 2016-07-20: 20 mg via INTRAVENOUS
  Filled 2016-07-20: qty 50

## 2016-07-20 MED ORDER — HEPARIN SOD (PORK) LOCK FLUSH 100 UNIT/ML IV SOLN
500.0000 [IU] | Freq: Once | INTRAVENOUS | Status: AC
Start: 2016-07-20 — End: 2016-07-20
  Administered 2016-07-20: 500 [IU] via INTRAVENOUS
  Filled 2016-07-20: qty 5

## 2016-07-20 MED ORDER — DEXAMETHASONE SODIUM PHOSPHATE 10 MG/ML IJ SOLN
10.0000 mg | Freq: Once | INTRAMUSCULAR | Status: AC
  Administered 2016-07-20: 10 mg via INTRAVENOUS
  Filled 2016-07-20: qty 1

## 2016-07-20 MED ORDER — SODIUM CHLORIDE 0.9 % IV SOLN
80.0000 mg/m2 | Freq: Once | INTRAVENOUS | Status: AC
  Administered 2016-07-20: 138 mg via INTRAVENOUS
  Filled 2016-07-20: qty 23

## 2016-07-20 NOTE — Progress Notes (Signed)
Last week noticed mild numbness/tingling in hands and feet. Feeling well today.

## 2016-07-20 NOTE — Progress Notes (Signed)
Leukopenia: Monitor. ANC 1.1 today. Adequate to treat today. Per MD note.

## 2016-08-13 ENCOUNTER — Ambulatory Visit
Admission: RE | Admit: 2016-08-13 | Discharge: 2016-08-13 | Disposition: A | Discharge status: Home / Self Care | Payer: Medicare Other | Source: Ambulatory Visit | Attending: Radiation Oncology | Admitting: Radiation Oncology

## 2016-08-13 ENCOUNTER — Encounter: Payer: Self-pay | Admitting: Radiation Oncology

## 2016-08-13 VITALS — BP 159/88 | HR 116 | Temp 96.1°F | Resp 20 | Wt 150.5 lb

## 2016-08-13 DIAGNOSIS — E039 Hypothyroidism, unspecified: Secondary | ICD-10-CM | POA: Insufficient documentation

## 2016-08-13 DIAGNOSIS — D6481 Anemia due to antineoplastic chemotherapy: Secondary | ICD-10-CM | POA: Diagnosis not present

## 2016-08-13 DIAGNOSIS — Z171 Estrogen receptor negative status [ER-]: Secondary | ICD-10-CM | POA: Insufficient documentation

## 2016-08-13 DIAGNOSIS — Z8669 Personal history of other diseases of the nervous system and sense organs: Secondary | ICD-10-CM | POA: Insufficient documentation

## 2016-08-13 DIAGNOSIS — Z8673 Personal history of transient ischemic attack (TIA), and cerebral infarction without residual deficits: Secondary | ICD-10-CM | POA: Insufficient documentation

## 2016-08-13 DIAGNOSIS — Z9012 Acquired absence of left breast and nipple: Secondary | ICD-10-CM | POA: Insufficient documentation

## 2016-08-13 DIAGNOSIS — C50212 Malignant neoplasm of upper-inner quadrant of left female breast: Secondary | ICD-10-CM | POA: Insufficient documentation

## 2016-08-13 DIAGNOSIS — E7800 Pure hypercholesterolemia, unspecified: Secondary | ICD-10-CM | POA: Insufficient documentation

## 2016-08-13 DIAGNOSIS — C778 Secondary and unspecified malignant neoplasm of lymph nodes of multiple regions: Secondary | ICD-10-CM | POA: Diagnosis present

## 2016-08-13 DIAGNOSIS — Z803 Family history of malignant neoplasm of breast: Secondary | ICD-10-CM | POA: Diagnosis not present

## 2016-08-13 DIAGNOSIS — M818 Other osteoporosis without current pathological fracture: Secondary | ICD-10-CM | POA: Diagnosis not present

## 2016-08-13 DIAGNOSIS — Z170 Estrogen receptor positive status [ER+]: Secondary | ICD-10-CM

## 2016-08-13 DIAGNOSIS — G629 Polyneuropathy, unspecified: Secondary | ICD-10-CM | POA: Diagnosis not present

## 2016-08-13 DIAGNOSIS — Z510 Encounter for antineoplastic radiation therapy: Secondary | ICD-10-CM | POA: Insufficient documentation

## 2016-08-13 DIAGNOSIS — M1990 Unspecified osteoarthritis, unspecified site: Secondary | ICD-10-CM | POA: Insufficient documentation

## 2016-08-13 DIAGNOSIS — I10 Essential (primary) hypertension: Secondary | ICD-10-CM | POA: Diagnosis not present

## 2016-08-13 DIAGNOSIS — K219 Gastro-esophageal reflux disease without esophagitis: Secondary | ICD-10-CM | POA: Diagnosis not present

## 2016-08-13 DIAGNOSIS — Z9049 Acquired absence of other specified parts of digestive tract: Secondary | ICD-10-CM | POA: Insufficient documentation

## 2016-08-13 NOTE — Consult Note (Signed)
NEW PATIENT EVALUATION  Name: Erica Levy  MRN: [id number]  Date:   08/13/2016     DOB: [birth year]   This 76 y.o. 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  REFERRING PHYSICIAN: Ricardo Jericho*  CHIEF COMPLAINT:  Chief Complaint  Patient presents with  . Breast Cancer    Pt is here for initial consultation of breast cancer.      DIAGNOSIS: The encounter diagnosis was Malignant neoplasm of upper-inner quadrant of left breast in female, estrogen receptor positive ([place]).   PREVIOUS INVESTIGATIONS:  Pathology reports reviewed Mammograms and ultrasound reviewed Clinical notes reviewed  HPI: Patient is a 76 year old female who presented with an abnormal mammogram the left breast showing calcifications as well as an irregular hypoechoic mass at 11 locked position 5 cm from the nipple measuring 1.5 cm in greatest dimension. She underwent ultrasound-guided biopsy which was positive for triple negative invasive mammary carcinoma. She went on to have a left modified radical mastectomy showing overall grade 2 triple negative invasive mammary carcinoma with DCIS present. Margins were clear at click greater than 1 cm. The was lymphovascular invasion noted. 2 of 3 lymph nodes showed macro metastatic disease. Patient went on to have adjuvant Cytoxan and Adriamycin followed by 12 weeks of Taxol. 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 She's also been having some goal point bladder issues is being seen by Dr. Tamala Julian this week for possibility of laparoscopic assisted cholecystectomy. From chest wall standpoint she is doing well she specifically denies any chest wall nodularity or masses cough or bone pain. She is now referred to radiation oncology for consideration of treatment.  PLANNED  TREATMENT REGIMEN: Left chest wall and peripheral lymphatic radiation  PAST MEDICAL HISTORY:  has a past medical history of Anemia; Anemia due to chemotherapy; Cancer ([place]); Generalized OA; GERD (gastroesophageal reflux disease); Hypercholesteremia; Hypertension; Hypothyroid; Migraine; Osteoporosis; Stroke [facility]); and TIA (transient ischemic attack).    PAST SURGICAL HISTORY:  Past Surgical History:  Procedure Laterality Date  . BREAST BIOPSY Left    bx/clip-neg  . BREAST BIOPSY Left 12/15/2015   path pending/us and stereo bx  . CATARACT EXTRACTION    . DILATION AND CURETTAGE OF UTERUS    . ENDOSCOPIC RETROGRADE CHOLANGIOPANCREATOGRAPHY (ERCP) WITH PROPOFOL N/A 06/19/2016   Procedure: ENDOSCOPIC RETROGRADE CHOLANGIOPANCREATOGRAPHY (ERCP) WITH PROPOFOL;  Surgeon: Lucilla Lame, MD;  Location: [facility] ENDOSCOPY;  Service: Endoscopy;  Laterality: N/A;  . EYE SURGERY Right    Cataract Extraction with IOL  . PARTIAL MASTECTOMY WITH AXILLARY SENTINEL LYMPH NODE BIOPSY Left 01/13/2016   Procedure: PARTIAL MASTECTOMY;  Surgeon: Leonie Green, MD;  Location: [facility] ORS;  Service: General;  Laterality: Left;  . PORTACATH PLACEMENT Right 01/13/2016   Procedure: INSERTION PORT-A-CATH;  Surgeon: Leonie Green, MD;  Location: [facility] ORS;  Service: General;  Laterality: Right;  . RETINAL DETACHMENT SURGERY Right    Dr. Starling Manns, [facility]  . TONSILLECTOMY      FAMILY HISTORY: family history includes Breast cancer (age of onset: 66) in her cousin; Dementia in her father; Hypertension in her mother; Osteoarthritis in her mother.  SOCIAL HISTORY:  reports that she has never smoked. She has never used smokeless tobacco. She reports that she does not drink alcohol or use drugs.  ALLERGIES: Patient has no known allergies.  MEDICATIONS:  Current Outpatient Prescriptions  Medication Sig Dispense Refill  . Cholecalciferol (VITAMIN D3) 5000 units CAPS Take 1 capsule by mouth every other day.    .  levothyroxine (SYNTHROID, LEVOTHROID) 50 MCG tablet Take 1 tablet by mouth daily.  0  . ondansetron (ZOFRAN ODT) 4 MG disintegrating tablet Take 1 tablet (4 mg total) by mouth every 8 (eight) hours as needed for nausea or vomiting. 20 tablet 0  . HYDROcodone-acetaminophen (NORCO/VICODIN) 5-325 MG tablet Take 1-2 tablets by mouth every 6 (six) hours as needed for moderate pain. (Patient not taking: Reported on 08/13/2016) 20 tablet 0  . potassium chloride SA (K-DUR,KLOR-CON) 20 MEQ tablet Take 20 mEq by mouth 2 (two) times daily.     No current facility-administered medications for this encounter.     ECOG PERFORMANCE STATUS:  0 - Asymptomatic  REVIEW OF SYSTEMS:  Patient denies any weight loss, fatigue, weakness, fever, chills or night sweats. Patient denies any loss of vision, blurred vision. Patient denies any ringing  of the ears or hearing loss. No irregular heartbeat. Patient denies heart murmur or history of fainting. Patient denies any chest pain or pain radiating to her upper extremities. Patient denies any shortness of breath, difficulty breathing at night, cough or hemoptysis. Patient denies any swelling in the lower legs. Patient denies any nausea vomiting, vomiting of blood, or coffee ground material in the vomitus. Patient denies any stomach pain. Patient states has had normal bowel movements no significant constipation or diarrhea. Patient denies any dysuria, hematuria or significant nocturia. Patient denies any problems walking, swelling in the joints or loss of balance. Patient denies any skin changes, loss of hair or loss of weight. Patient denies any excessive worrying or anxiety or significant depression. Patient denies any problems with insomnia. Patient denies excessive thirst, polyuria, polydipsia. Patient denies any swollen glands, patient denies easy bruising or easy bleeding. Patient denies any recent infections, allergies or URI. Patient "s visual fields have not changed  significantly in recent time.    PHYSICAL EXAM: BP (!) 159/88   Pulse (!) 116   Temp (!) 96.1 F (35.6 C)   Resp 20   Wt 150 lb 7.4 oz (68.2 kg)   BMI 27.52 kg/m  Patient is status post left modified radical mastectomy. Chest walls clear without evidence of mass or nodularity right breast is free of dominant mass or nodularity in 2 positions examined. No axillary or supraclavicular adenopathy is identified. Well-developed well-nourished patient in NAD. HEENT reveals PERLA, EOMI, discs not visualized.  Oral cavity is clear. No oral mucosal lesions are identified. Neck is clear without evidence of cervical or supraclavicular adenopathy. Lungs are clear to A&P. Cardiac examination is essentially unremarkable with regular rate and rhythm without murmur rub or thrill. Abdomen is benign with no organomegaly or masses noted. Motor sensory and DTR levels are equal and symmetric in the upper and lower extremities. Cranial nerves II through XII are grossly intact. Proprioception is intact. No peripheral adenopathy or edema is identified. No motor or sensory levels are noted. Crude visual fields are within normal range.  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 Would treat those areas to 5040 cGy in 28 fractions. Do not see need to boost her scar since margins were significantly clear. Risks and benefits of treatment including skin reaction fatigue alteration of blood counts and slight possibility of lymphedema of  her left upper extremity were all discussed in detail with the patient.There will be extra effort by both professional staff as well as technical staff to coordinate and manage concurrent chemoradiation and ensuing side effects during her treatments. I'm especially concerned about the limited dissection of her axilla and this is one strong recommendations for adjuvant chest wall peripheral lymphatic radiation. I personally ordered and set up CT simulation for later this week.  I would like to take this opportunity to thank you for allowing me to participate in the care of your patient.Armstead Peaks., MD

## 2016-08-15 ENCOUNTER — Ambulatory Visit: Payer: Medicare Other

## 2016-08-16 ENCOUNTER — Ambulatory Visit: Payer: Medicare Other

## 2016-08-21 ENCOUNTER — Ambulatory Visit
Admission: RE | Admit: 2016-08-21 | Discharge: 2016-08-21 | Disposition: A | Discharge status: Home / Self Care | Payer: Medicare Other | Source: Ambulatory Visit | Attending: Radiation Oncology | Admitting: Radiation Oncology

## 2016-08-21 DIAGNOSIS — C778 Secondary and unspecified malignant neoplasm of lymph nodes of multiple regions: Secondary | ICD-10-CM | POA: Diagnosis not present

## 2016-08-23 ENCOUNTER — Other Ambulatory Visit: Payer: Self-pay | Admitting: *Deleted

## 2016-08-23 DIAGNOSIS — C778 Secondary and unspecified malignant neoplasm of lymph nodes of multiple regions: Secondary | ICD-10-CM | POA: Diagnosis not present

## 2016-08-23 DIAGNOSIS — C50212 Malignant neoplasm of upper-inner quadrant of left female breast: Secondary | ICD-10-CM

## 2016-08-28 ENCOUNTER — Ambulatory Visit
Admission: RE | Admit: 2016-08-28 | Discharge: 2016-08-28 | Disposition: A | Discharge status: Home / Self Care | Payer: Medicare Other | Source: Ambulatory Visit | Attending: Radiation Oncology | Admitting: Radiation Oncology

## 2016-08-28 DIAGNOSIS — C778 Secondary and unspecified malignant neoplasm of lymph nodes of multiple regions: Secondary | ICD-10-CM | POA: Diagnosis not present

## 2016-08-29 ENCOUNTER — Ambulatory Visit
Admission: RE | Admit: 2016-08-29 | Discharge: 2016-08-29 | Disposition: A | Discharge status: Home / Self Care | Payer: Medicare Other | Source: Ambulatory Visit | Attending: Radiation Oncology | Admitting: Radiation Oncology

## 2016-08-29 DIAGNOSIS — C778 Secondary and unspecified malignant neoplasm of lymph nodes of multiple regions: Secondary | ICD-10-CM | POA: Diagnosis not present

## 2016-08-30 ENCOUNTER — Ambulatory Visit
Admission: RE | Admit: 2016-08-30 | Discharge: 2016-08-30 | Disposition: A | Discharge status: Home / Self Care | Payer: Medicare Other | Source: Ambulatory Visit | Attending: Radiation Oncology | Admitting: Radiation Oncology

## 2016-08-30 DIAGNOSIS — C778 Secondary and unspecified malignant neoplasm of lymph nodes of multiple regions: Secondary | ICD-10-CM | POA: Diagnosis not present

## 2016-08-31 ENCOUNTER — Ambulatory Visit
Admission: RE | Admit: 2016-08-31 | Discharge: 2016-08-31 | Disposition: A | Discharge status: Home / Self Care | Payer: Medicare Other | Source: Ambulatory Visit | Attending: Radiation Oncology | Admitting: Radiation Oncology

## 2016-08-31 DIAGNOSIS — C778 Secondary and unspecified malignant neoplasm of lymph nodes of multiple regions: Secondary | ICD-10-CM | POA: Diagnosis not present

## 2016-09-03 ENCOUNTER — Ambulatory Visit
Admission: RE | Admit: 2016-09-03 | Discharge: 2016-09-03 | Disposition: A | Discharge status: Home / Self Care | Payer: Medicare Other | Source: Ambulatory Visit | Attending: Radiation Oncology | Admitting: Radiation Oncology

## 2016-09-03 ENCOUNTER — Inpatient Hospital Stay: Payer: Medicare Other | Attending: Oncology

## 2016-09-03 DIAGNOSIS — Z452 Encounter for adjustment and management of vascular access device: Secondary | ICD-10-CM | POA: Diagnosis not present

## 2016-09-03 DIAGNOSIS — C50212 Malignant neoplasm of upper-inner quadrant of left female breast: Secondary | ICD-10-CM | POA: Insufficient documentation

## 2016-09-03 DIAGNOSIS — C778 Secondary and unspecified malignant neoplasm of lymph nodes of multiple regions: Secondary | ICD-10-CM | POA: Diagnosis not present

## 2016-09-03 DIAGNOSIS — Z171 Estrogen receptor negative status [ER-]: Secondary | ICD-10-CM | POA: Insufficient documentation

## 2016-09-03 MED ORDER — SODIUM CHLORIDE 0.9% FLUSH
10.0000 mL | INTRAVENOUS | Status: DC | PRN
  Administered 2016-09-03: 10 mL via INTRAVENOUS
  Filled 2016-09-03: qty 10

## 2016-09-03 MED ORDER — HEPARIN SOD (PORK) LOCK FLUSH 100 UNIT/ML IV SOLN
INTRAVENOUS | Status: AC
  Filled 2016-09-03: qty 5

## 2016-09-03 MED ORDER — HEPARIN SOD (PORK) LOCK FLUSH 100 UNIT/ML IV SOLN
500.0000 [IU] | Freq: Once | INTRAVENOUS | Status: AC
  Administered 2016-09-03: 500 [IU] via INTRAVENOUS

## 2016-09-04 ENCOUNTER — Ambulatory Visit
Admission: RE | Admit: 2016-09-04 | Discharge: 2016-09-04 | Disposition: A | Discharge status: Home / Self Care | Payer: Medicare Other | Source: Ambulatory Visit | Attending: Radiation Oncology | Admitting: Radiation Oncology

## 2016-09-04 DIAGNOSIS — C778 Secondary and unspecified malignant neoplasm of lymph nodes of multiple regions: Secondary | ICD-10-CM | POA: Diagnosis not present

## 2016-09-05 ENCOUNTER — Ambulatory Visit
Admission: RE | Admit: 2016-09-05 | Discharge: 2016-09-05 | Disposition: A | Discharge status: Home / Self Care | Payer: Medicare Other | Source: Ambulatory Visit | Attending: Radiation Oncology | Admitting: Radiation Oncology

## 2016-09-05 DIAGNOSIS — C778 Secondary and unspecified malignant neoplasm of lymph nodes of multiple regions: Secondary | ICD-10-CM | POA: Diagnosis not present

## 2016-09-06 ENCOUNTER — Ambulatory Visit
Admission: RE | Admit: 2016-09-06 | Discharge: 2016-09-06 | Disposition: A | Discharge status: Home / Self Care | Payer: Medicare Other | Source: Ambulatory Visit | Attending: Radiation Oncology | Admitting: Radiation Oncology

## 2016-09-06 DIAGNOSIS — C778 Secondary and unspecified malignant neoplasm of lymph nodes of multiple regions: Secondary | ICD-10-CM | POA: Diagnosis not present

## 2016-09-07 ENCOUNTER — Ambulatory Visit
Admission: RE | Admit: 2016-09-07 | Discharge: 2016-09-07 | Disposition: A | Discharge status: Home / Self Care | Payer: Medicare Other | Source: Ambulatory Visit | Attending: Radiation Oncology | Admitting: Radiation Oncology

## 2016-09-07 DIAGNOSIS — C778 Secondary and unspecified malignant neoplasm of lymph nodes of multiple regions: Secondary | ICD-10-CM | POA: Diagnosis not present

## 2016-09-10 ENCOUNTER — Ambulatory Visit
Admission: RE | Admit: 2016-09-10 | Discharge: 2016-09-10 | Disposition: A | Discharge status: Home / Self Care | Payer: Medicare Other | Source: Ambulatory Visit | Attending: Radiation Oncology | Admitting: Radiation Oncology

## 2016-09-10 DIAGNOSIS — C778 Secondary and unspecified malignant neoplasm of lymph nodes of multiple regions: Secondary | ICD-10-CM | POA: Diagnosis not present

## 2016-09-11 ENCOUNTER — Other Ambulatory Visit: Payer: Self-pay | Admitting: *Deleted

## 2016-09-11 ENCOUNTER — Ambulatory Visit
Admission: RE | Admit: 2016-09-11 | Discharge: 2016-09-11 | Disposition: A | Discharge status: Home / Self Care | Payer: Medicare Other | Source: Ambulatory Visit | Attending: Radiation Oncology | Admitting: Radiation Oncology

## 2016-09-11 DIAGNOSIS — C778 Secondary and unspecified malignant neoplasm of lymph nodes of multiple regions: Secondary | ICD-10-CM | POA: Diagnosis not present

## 2016-09-11 MED ORDER — AZITHROMYCIN 250 MG PO TABS: ORAL_TABLET | ORAL | 0 refills | Status: DC

## 2016-09-12 ENCOUNTER — Inpatient Hospital Stay: Payer: Medicare Other

## 2016-09-12 ENCOUNTER — Ambulatory Visit
Admission: RE | Admit: 2016-09-12 | Discharge: 2016-09-12 | Disposition: A | Discharge status: Home / Self Care | Payer: Medicare Other | Source: Ambulatory Visit | Attending: Radiation Oncology | Admitting: Radiation Oncology

## 2016-09-12 DIAGNOSIS — C50212 Malignant neoplasm of upper-inner quadrant of left female breast: Secondary | ICD-10-CM

## 2016-09-12 DIAGNOSIS — C778 Secondary and unspecified malignant neoplasm of lymph nodes of multiple regions: Secondary | ICD-10-CM | POA: Diagnosis not present

## 2016-09-12 LAB — CBC
HCT: 34.2 % — ABNORMAL LOW (ref 35.0–47.0)
Hemoglobin: 11.8 g/dL — ABNORMAL LOW (ref 12.0–16.0)
MCH: 30.9 pg (ref 26.0–34.0)
MCHC: 34.6 g/dL (ref 32.0–36.0)
MCV: 89.1 fL (ref 80.0–100.0)
PLATELETS: 181 10*3/uL (ref 150–440)
RBC: 3.84 MIL/uL (ref 3.80–5.20)
RDW: 14 % (ref 11.5–14.5)
WBC: 3.4 10*3/uL — AB (ref 3.6–11.0)

## 2016-09-13 ENCOUNTER — Ambulatory Visit
Admission: RE | Admit: 2016-09-13 | Discharge: 2016-09-13 | Disposition: A | Discharge status: Home / Self Care | Payer: Medicare Other | Source: Ambulatory Visit | Attending: Radiation Oncology | Admitting: Radiation Oncology

## 2016-09-13 DIAGNOSIS — C778 Secondary and unspecified malignant neoplasm of lymph nodes of multiple regions: Secondary | ICD-10-CM | POA: Diagnosis not present

## 2016-09-14 ENCOUNTER — Ambulatory Visit
Admission: RE | Admit: 2016-09-14 | Discharge: 2016-09-14 | Disposition: A | Discharge status: Home / Self Care | Payer: Medicare Other | Source: Ambulatory Visit | Attending: Radiation Oncology | Admitting: Radiation Oncology

## 2016-09-14 DIAGNOSIS — C778 Secondary and unspecified malignant neoplasm of lymph nodes of multiple regions: Secondary | ICD-10-CM | POA: Diagnosis not present

## 2016-09-17 ENCOUNTER — Ambulatory Visit
Admission: RE | Admit: 2016-09-17 | Discharge: 2016-09-17 | Disposition: A | Discharge status: Home / Self Care | Payer: Medicare Other | Source: Ambulatory Visit | Attending: Radiation Oncology | Admitting: Radiation Oncology

## 2016-09-17 DIAGNOSIS — C778 Secondary and unspecified malignant neoplasm of lymph nodes of multiple regions: Secondary | ICD-10-CM | POA: Diagnosis not present

## 2016-09-18 ENCOUNTER — Ambulatory Visit
Admission: RE | Admit: 2016-09-18 | Discharge: 2016-09-18 | Disposition: A | Discharge status: Home / Self Care | Payer: Medicare Other | Source: Ambulatory Visit | Attending: Radiation Oncology | Admitting: Radiation Oncology

## 2016-09-18 DIAGNOSIS — C778 Secondary and unspecified malignant neoplasm of lymph nodes of multiple regions: Secondary | ICD-10-CM | POA: Diagnosis not present

## 2016-09-19 ENCOUNTER — Ambulatory Visit
Admission: RE | Admit: 2016-09-19 | Discharge: 2016-09-19 | Disposition: A | Discharge status: Home / Self Care | Payer: Medicare Other | Source: Ambulatory Visit | Attending: Radiation Oncology | Admitting: Radiation Oncology

## 2016-09-19 DIAGNOSIS — C778 Secondary and unspecified malignant neoplasm of lymph nodes of multiple regions: Secondary | ICD-10-CM | POA: Diagnosis not present

## 2016-09-20 ENCOUNTER — Ambulatory Visit
Admission: RE | Admit: 2016-09-20 | Discharge: 2016-09-20 | Disposition: A | Discharge status: Home / Self Care | Payer: Medicare Other | Source: Ambulatory Visit | Attending: Radiation Oncology | Admitting: Radiation Oncology

## 2016-09-20 DIAGNOSIS — C778 Secondary and unspecified malignant neoplasm of lymph nodes of multiple regions: Secondary | ICD-10-CM | POA: Diagnosis not present

## 2016-09-21 ENCOUNTER — Ambulatory Visit
Admission: RE | Admit: 2016-09-21 | Discharge: 2016-09-21 | Disposition: A | Discharge status: Home / Self Care | Payer: Medicare Other | Source: Ambulatory Visit | Attending: Radiation Oncology | Admitting: Radiation Oncology

## 2016-09-21 ENCOUNTER — Inpatient Hospital Stay: Payer: Medicare Other

## 2016-09-21 DIAGNOSIS — Z171 Estrogen receptor negative status [ER-]: Secondary | ICD-10-CM

## 2016-09-21 DIAGNOSIS — C50212 Malignant neoplasm of upper-inner quadrant of left female breast: Secondary | ICD-10-CM

## 2016-09-21 DIAGNOSIS — C778 Secondary and unspecified malignant neoplasm of lymph nodes of multiple regions: Secondary | ICD-10-CM | POA: Diagnosis not present

## 2016-09-21 LAB — CBC WITH DIFFERENTIAL/PLATELET
BASOS PCT: 1 %
Basophils Absolute: 0 10*3/uL (ref 0–0.1)
Eosinophils Absolute: 0.3 10*3/uL (ref 0–0.7)
Eosinophils Relative: 9 %
HEMATOCRIT: 33.3 % — AB (ref 35.0–47.0)
HEMOGLOBIN: 11.6 g/dL — AB (ref 12.0–16.0)
LYMPHS ABS: 0.7 10*3/uL — AB (ref 1.0–3.6)
Lymphocytes Relative: 25 %
MCH: 30.9 pg (ref 26.0–34.0)
MCHC: 35 g/dL (ref 32.0–36.0)
MCV: 88.3 fL (ref 80.0–100.0)
MONO ABS: 0.2 10*3/uL (ref 0.2–0.9)
MONOS PCT: 7 %
NEUTROS ABS: 1.7 10*3/uL (ref 1.4–6.5)
NEUTROS PCT: 58 %
Platelets: 161 10*3/uL (ref 150–440)
RBC: 3.77 MIL/uL — ABNORMAL LOW (ref 3.80–5.20)
RDW: 14.1 % (ref 11.5–14.5)
WBC: 2.9 10*3/uL — ABNORMAL LOW (ref 3.6–11.0)

## 2016-09-21 LAB — SAMPLE TO BLOOD BANK

## 2016-09-21 LAB — COMPREHENSIVE METABOLIC PANEL
ALBUMIN: 4 g/dL (ref 3.5–5.0)
ALT: 17 U/L (ref 14–54)
ANION GAP: 8 (ref 5–15)
AST: 23 U/L (ref 15–41)
Alkaline Phosphatase: 105 U/L (ref 38–126)
BILIRUBIN TOTAL: 0.9 mg/dL (ref 0.3–1.2)
BUN: 14 mg/dL (ref 6–20)
CO2: 24 mmol/L (ref 22–32)
Calcium: 9.1 mg/dL (ref 8.9–10.3)
Chloride: 105 mmol/L (ref 101–111)
Creatinine, Ser: 0.61 mg/dL (ref 0.44–1.00)
GFR calc Af Amer: >60 mL/min (ref >60)
GLUCOSE: 166 mg/dL — AB (ref 65–99)
Potassium: 3.6 mmol/L (ref 3.5–5.1)
Sodium: 137 mmol/L (ref 135–145)
TOTAL PROTEIN: 6.7 g/dL (ref 6.5–8.1)

## 2016-09-24 ENCOUNTER — Ambulatory Visit: Payer: Medicare Other

## 2016-09-25 ENCOUNTER — Ambulatory Visit: Payer: Medicare Other

## 2016-09-26 ENCOUNTER — Ambulatory Visit: Payer: Medicare Other

## 2016-09-26 ENCOUNTER — Inpatient Hospital Stay: Payer: Medicare Other

## 2016-09-27 ENCOUNTER — Ambulatory Visit: Payer: Medicare Other

## 2016-09-28 ENCOUNTER — Ambulatory Visit: Payer: Medicare Other

## 2016-09-28 DIAGNOSIS — C778 Secondary and unspecified malignant neoplasm of lymph nodes of multiple regions: Secondary | ICD-10-CM | POA: Diagnosis not present

## 2016-10-01 ENCOUNTER — Ambulatory Visit
Admission: RE | Admit: 2016-10-01 | Discharge: 2016-10-01 | Disposition: A | Discharge status: Home / Self Care | Payer: Medicare Other | Source: Ambulatory Visit | Attending: Radiation Oncology | Admitting: Radiation Oncology

## 2016-10-01 DIAGNOSIS — C778 Secondary and unspecified malignant neoplasm of lymph nodes of multiple regions: Secondary | ICD-10-CM | POA: Diagnosis not present

## 2016-10-02 ENCOUNTER — Ambulatory Visit
Admission: RE | Admit: 2016-10-02 | Discharge: 2016-10-02 | Disposition: A | Discharge status: Home / Self Care | Payer: Medicare Other | Source: Ambulatory Visit | Attending: Radiation Oncology | Admitting: Radiation Oncology

## 2016-10-02 DIAGNOSIS — C778 Secondary and unspecified malignant neoplasm of lymph nodes of multiple regions: Secondary | ICD-10-CM | POA: Diagnosis not present

## 2016-10-03 ENCOUNTER — Ambulatory Visit
Admission: RE | Admit: 2016-10-03 | Discharge: 2016-10-03 | Disposition: A | Discharge status: Home / Self Care | Payer: Medicare Other | Source: Ambulatory Visit | Attending: Radiation Oncology | Admitting: Radiation Oncology

## 2016-10-03 DIAGNOSIS — C778 Secondary and unspecified malignant neoplasm of lymph nodes of multiple regions: Secondary | ICD-10-CM | POA: Diagnosis not present

## 2016-10-04 ENCOUNTER — Ambulatory Visit
Admission: RE | Admit: 2016-10-04 | Discharge: 2016-10-04 | Disposition: A | Discharge status: Home / Self Care | Payer: Medicare Other | Source: Ambulatory Visit | Attending: Radiation Oncology | Admitting: Radiation Oncology

## 2016-10-04 DIAGNOSIS — C778 Secondary and unspecified malignant neoplasm of lymph nodes of multiple regions: Secondary | ICD-10-CM | POA: Diagnosis not present

## 2016-10-05 ENCOUNTER — Ambulatory Visit
Admission: RE | Admit: 2016-10-05 | Discharge: 2016-10-05 | Disposition: A | Discharge status: Home / Self Care | Payer: Medicare Other | Source: Ambulatory Visit | Attending: Radiation Oncology | Admitting: Radiation Oncology

## 2016-10-05 DIAGNOSIS — C778 Secondary and unspecified malignant neoplasm of lymph nodes of multiple regions: Secondary | ICD-10-CM | POA: Diagnosis not present

## 2016-10-06 ENCOUNTER — Encounter: Payer: Self-pay | Admitting: *Deleted

## 2016-10-06 ENCOUNTER — Ambulatory Visit
Admission: EM | Admit: 2016-10-06 | Discharge: 2016-10-06 | Disposition: A | Discharge status: Home / Self Care | Payer: Medicare Other | Attending: Family Medicine | Admitting: Family Medicine

## 2016-10-06 DIAGNOSIS — J0100 Acute maxillary sinusitis, unspecified: Secondary | ICD-10-CM | POA: Diagnosis not present

## 2016-10-06 DIAGNOSIS — J0110 Acute frontal sinusitis, unspecified: Secondary | ICD-10-CM | POA: Diagnosis not present

## 2016-10-06 DIAGNOSIS — R51 Headache: Secondary | ICD-10-CM | POA: Diagnosis not present

## 2016-10-06 DIAGNOSIS — R519 Headache, unspecified: Secondary | ICD-10-CM

## 2016-10-06 MED ORDER — HYDROCODONE-ACETAMINOPHEN 5-325 MG PO TABS: ORAL_TABLET | ORAL | 0 refills | Status: DC

## 2016-10-06 MED ORDER — AMOXICILLIN-POT CLAVULANATE 875-125 MG PO TABS: 1.0000 | ORAL_TABLET | Freq: Two times a day (BID) | ORAL | 0 refills | Status: DC

## 2016-10-06 NOTE — ED Triage Notes (Signed)
Patient started having symptom of headache 1 month ago. Patient not sure what's causing her headache.

## 2016-10-06 NOTE — ED Provider Notes (Signed)
[facility]    CSN: [id number] Arrival date & time: 10/06/16  1020     History   Chief Complaint Chief Complaint  Patient presents with  . Headache    HPI Erica Levy is a 76 y.o. female.   The history is provided by the patient.  Headache  Pain location:  Frontal Radiates to:  Does not radiate Severity currently:  4/10 Onset quality:  Gradual Duration:  4 weeks Timing:  Constant Progression:  Unchanged Chronicity:  New Similar to prior headaches: no   Relieved by:  Acetaminophen and NSAIDs (mild) Associated symptoms: congestion, facial pain, fatigue, sinus pressure and URI   Associated symptoms: no abdominal pain, no back pain, no blurred vision, no cough, no diarrhea, no dizziness, no drainage, no ear pain, no eye pain, no fever, no focal weakness, no hearing loss, no loss of balance, no myalgias, no nausea, no near-syncope, no neck pain, no neck stiffness, no numbness, no paresthesias, no photophobia, no seizures, no sore throat, no swollen glands, no syncope, no tingling, no visual change, no vomiting and no weakness     Past Medical History:  Diagnosis Date  . Anemia   . Anemia due to chemotherapy   . Cancer ([facility])    breast  . Generalized OA   . GERD (gastroesophageal reflux disease)   . Hypercholesteremia   . Hypertension   . Hypothyroid   . Migraine   . Osteoporosis   . Stroke ([place])   . TIA (transient ischemic attack)    July    Patient Active Problem List   Diagnosis Date Noted  . Bilateral upper abdominal pain 06/17/2016  . Cholecystitis, acute 06/17/2016  . Elevated troponin 06/17/2016  . Acute cholecystitis 06/17/2016  . Chest pain 06/06/2016  . Breast cancer of upper-inner quadrant of left female breast ([place]) 01/06/2016  . CVA (cerebral infarction) 02/09/2015    Past Surgical History:  Procedure Laterality Date  . BREAST BIOPSY Left    bx/clip-neg  . BREAST BIOPSY Left 12/15/2015   path pending/us and stereo bx  .  CATARACT EXTRACTION    . DILATION AND CURETTAGE OF UTERUS    . ENDOSCOPIC RETROGRADE CHOLANGIOPANCREATOGRAPHY (ERCP) WITH PROPOFOL N/A 06/19/2016   Procedure: ENDOSCOPIC RETROGRADE CHOLANGIOPANCREATOGRAPHY (ERCP) WITH PROPOFOL;  Surgeon: Lucilla Lame, MD;  Location: [facility] ENDOSCOPY;  Service: Endoscopy;  Laterality: N/A;  . EYE SURGERY Right    Cataract Extraction with IOL  . PARTIAL MASTECTOMY WITH AXILLARY SENTINEL LYMPH NODE BIOPSY Left 01/13/2016   Procedure: PARTIAL MASTECTOMY;  Surgeon: Leonie Green, MD;  Location: [facility] ORS;  Service: General;  Laterality: Left;  . PORTACATH PLACEMENT Right 01/13/2016   Procedure: INSERTION PORT-A-CATH;  Surgeon: Leonie Green, MD;  Location: [facility] ORS;  Service: General;  Laterality: Right;  . RETINAL DETACHMENT SURGERY Right    Dr. Starling Manns, [facility]  . TONSILLECTOMY      OB History    No data available       Home Medications    Prior to Admission medications   Medication Sig Start Date End Date Taking? Authorizing Provider  atorvastatin (LIPITOR) 80 MG tablet Take 80 mg by mouth daily.   Yes Historical Provider, MD  Cholecalciferol (VITAMIN D3) 5000 units CAPS Take 1 capsule by mouth every other day.   Yes Historical Provider, MD  hydrochlorothiazide (MICROZIDE) 12.5 MG capsule Take 12.5 mg by mouth daily.   Yes Historical Provider, MD  levothyroxine (SYNTHROID, LEVOTHROID) 50 MCG tablet Take 1 tablet by mouth  daily. 01/08/16  Yes Historical Provider, MD  ondansetron (ZOFRAN ODT) 4 MG disintegrating tablet Take 1 tablet (4 mg total) by mouth every 8 (eight) hours as needed for nausea or vomiting. 06/22/16  Yes Gladstone Lighter, MD  amoxicillin-clavulanate (AUGMENTIN) 875-125 MG tablet Take 1 tablet by mouth 2 (two) times daily. 10/06/16   Norval Gable, MD  azithromycin (ZITHROMAX Z-PAK) 250 MG tablet Follow package directions 09/11/16   Noreene Filbert, MD  HYDROcodone-acetaminophen (NORCO/VICODIN) 5-325 MG tablet 1-2 tabs po qd prn for  pain/headache 10/06/16   Norval Gable, MD  potassium chloride SA (K-DUR,KLOR-CON) 20 MEQ tablet Take 20 mEq by mouth 2 (two) times daily.    Historical Provider, MD    Family History Family History  Problem Relation Age of Onset  . Hypertension Mother   . Osteoarthritis Mother   . Dementia Father   . Breast cancer Cousin 98    mat cousin    Social History Social History  Substance Use Topics  . Smoking status: Never Smoker  . Smokeless tobacco: Never Used  . Alcohol use No     Allergies   Patient has no known allergies.   Review of Systems Review of Systems  Constitutional: Positive for fatigue. Negative for fever.  HENT: Positive for congestion and sinus pressure. Negative for ear pain, hearing loss, postnasal drip and sore throat.   Eyes: Negative for blurred vision, photophobia and pain.  Respiratory: Negative for cough.   Cardiovascular: Negative for syncope and near-syncope.  Gastrointestinal: Negative for abdominal pain, diarrhea, nausea and vomiting.  Musculoskeletal: Negative for back pain, myalgias, neck pain and neck stiffness.  Neurological: Positive for headaches. Negative for dizziness, focal weakness, seizures, weakness, numbness, paresthesias and loss of balance.     Physical Exam Triage Vital Signs ED Triage Vitals  Enc Vitals Group     BP 10/06/16 1109 (!) 144/51     Pulse Rate 10/06/16 1109 96     Resp 10/06/16 1109 16     Temp 10/06/16 1109 98 F (36.7 C)     Temp Source 10/06/16 1109 Oral     SpO2 10/06/16 1109 100 %     Weight 10/06/16 1111 148 lb (67.1 kg)     Height 10/06/16 1111 5\' 2"  (1.575 m)     Head Circumference --      Peak Flow --      Pain Score 10/06/16 1114 2     Pain Loc --      Pain Edu? --      Excl. in [place]? --    No data found.   Updated Vital Signs BP (!) 144/51 (BP Location: Right Arm)   Pulse 96   Temp 98 F (36.7 C) (Oral)   Resp 16   Ht 5\' 2"  (1.575 m)   Wt 148 lb (67.1 kg)   SpO2 100%   BMI 27.07 kg/m     Visual Acuity Right Eye Distance:   Left Eye Distance:   Bilateral Distance:    Right Eye Near:   Left Eye Near:    Bilateral Near:     Physical Exam  Constitutional: She is oriented to person, place, and time. She appears well-developed and well-nourished. No distress.  HENT:  Head: Normocephalic and atraumatic.  Right Ear: Tympanic membrane, external ear and ear canal normal.  Left Ear: Tympanic membrane, external ear and ear canal normal.  Nose: Mucosal edema and rhinorrhea present. No nose lacerations, sinus tenderness, nasal deformity, septal deviation or nasal septal  hematoma. No epistaxis.  No foreign bodies. Right sinus exhibits maxillary sinus tenderness and frontal sinus tenderness. Left sinus exhibits maxillary sinus tenderness and frontal sinus tenderness.  Mouth/Throat: Uvula is midline, oropharynx is clear and moist and mucous membranes are normal. No oropharyngeal exudate.  Eyes: Conjunctivae and EOM are normal. Pupils are equal, round, and reactive to light. Right eye exhibits no discharge. Left eye exhibits no discharge. No scleral icterus.  Neck: Normal range of motion. Neck supple. No thyromegaly present.  Cardiovascular: Normal rate, regular rhythm and normal heart sounds.   Pulmonary/Chest: Effort normal and breath sounds normal. No respiratory distress. She has no wheezes. She has no rales.  Lymphadenopathy:    She has no cervical adenopathy.  Neurological: She is alert and oriented to person, place, and time. She displays normal reflexes. No cranial nerve deficit or sensory deficit. She exhibits normal muscle tone. Coordination normal.  Skin: She is not diaphoretic.  Nursing note and vitals reviewed.    UC Treatments / Results  Labs (all labs ordered are listed, but only abnormal results are displayed) Labs Reviewed - No data to display  EKG  EKG Interpretation None       Radiology No results found.  Procedures Procedures (including critical  care time)  Medications Ordered in UC Medications - No data to display   Initial Impression / Assessment and Plan / UC Course  I have reviewed the triage vital signs and the nursing notes.  Pertinent labs & imaging results that were available during my care of the patient were reviewed by me and considered in my medical decision making (see chart for details).       Final Clinical Impressions(s) / UC Diagnoses   Final diagnoses:  Acute maxillary sinusitis, recurrence not specified  Acute frontal sinusitis, recurrence not specified  Sinus headache    New Prescriptions Discharge Medication List as of 10/06/2016 12:22 PM    START taking these medications   Details  amoxicillin-clavulanate (AUGMENTIN) 875-125 MG tablet Take 1 tablet by mouth 2 (two) times daily., Starting Sat 10/06/2016, Normal        1.diagnosis reviewed with patient 2. rx as per orders above; reviewed possible side effects, interactions, risks and benefits  3. Recommend supportive treatment with otc flonase  4. Follow-up prn if symptoms worsen or don't improve   Norval Gable, MD 10/06/16 1236

## 2016-10-08 ENCOUNTER — Ambulatory Visit
Admission: RE | Admit: 2016-10-08 | Discharge: 2016-10-08 | Disposition: A | Discharge status: Home / Self Care | Payer: Medicare Other | Source: Ambulatory Visit | Attending: Radiation Oncology | Admitting: Radiation Oncology

## 2016-10-08 DIAGNOSIS — C778 Secondary and unspecified malignant neoplasm of lymph nodes of multiple regions: Secondary | ICD-10-CM | POA: Diagnosis not present

## 2016-10-09 ENCOUNTER — Ambulatory Visit
Admission: RE | Admit: 2016-10-09 | Discharge: 2016-10-09 | Disposition: A | Discharge status: Home / Self Care | Payer: Medicare Other | Source: Ambulatory Visit | Attending: Radiation Oncology | Admitting: Radiation Oncology

## 2016-10-09 DIAGNOSIS — C778 Secondary and unspecified malignant neoplasm of lymph nodes of multiple regions: Secondary | ICD-10-CM | POA: Diagnosis not present

## 2016-10-10 ENCOUNTER — Inpatient Hospital Stay: Payer: Medicare Other | Attending: Oncology

## 2016-10-10 ENCOUNTER — Ambulatory Visit
Admission: RE | Admit: 2016-10-10 | Discharge: 2016-10-10 | Disposition: A | Discharge status: Home / Self Care | Payer: Medicare Other | Source: Ambulatory Visit | Attending: Radiation Oncology | Admitting: Radiation Oncology

## 2016-10-10 DIAGNOSIS — Z803 Family history of malignant neoplasm of breast: Secondary | ICD-10-CM | POA: Diagnosis not present

## 2016-10-10 DIAGNOSIS — E039 Hypothyroidism, unspecified: Secondary | ICD-10-CM | POA: Diagnosis not present

## 2016-10-10 DIAGNOSIS — Z8673 Personal history of transient ischemic attack (TIA), and cerebral infarction without residual deficits: Secondary | ICD-10-CM | POA: Insufficient documentation

## 2016-10-10 DIAGNOSIS — R51 Headache: Secondary | ICD-10-CM | POA: Diagnosis not present

## 2016-10-10 DIAGNOSIS — D6481 Anemia due to antineoplastic chemotherapy: Secondary | ICD-10-CM | POA: Insufficient documentation

## 2016-10-10 DIAGNOSIS — D72819 Decreased white blood cell count, unspecified: Secondary | ICD-10-CM | POA: Insufficient documentation

## 2016-10-10 DIAGNOSIS — K219 Gastro-esophageal reflux disease without esophagitis: Secondary | ICD-10-CM | POA: Diagnosis not present

## 2016-10-10 DIAGNOSIS — C50212 Malignant neoplasm of upper-inner quadrant of left female breast: Secondary | ICD-10-CM

## 2016-10-10 DIAGNOSIS — E7800 Pure hypercholesterolemia, unspecified: Secondary | ICD-10-CM | POA: Diagnosis not present

## 2016-10-10 DIAGNOSIS — Z171 Estrogen receptor negative status [ER-]: Secondary | ICD-10-CM | POA: Insufficient documentation

## 2016-10-10 DIAGNOSIS — K8080 Other cholelithiasis without obstruction: Secondary | ICD-10-CM | POA: Insufficient documentation

## 2016-10-10 DIAGNOSIS — Z8669 Personal history of other diseases of the nervous system and sense organs: Secondary | ICD-10-CM | POA: Insufficient documentation

## 2016-10-10 DIAGNOSIS — Z452 Encounter for adjustment and management of vascular access device: Secondary | ICD-10-CM | POA: Insufficient documentation

## 2016-10-10 DIAGNOSIS — M818 Other osteoporosis without current pathological fracture: Secondary | ICD-10-CM | POA: Insufficient documentation

## 2016-10-10 DIAGNOSIS — I10 Essential (primary) hypertension: Secondary | ICD-10-CM | POA: Diagnosis not present

## 2016-10-10 DIAGNOSIS — Z79899 Other long term (current) drug therapy: Secondary | ICD-10-CM | POA: Insufficient documentation

## 2016-10-10 DIAGNOSIS — C778 Secondary and unspecified malignant neoplasm of lymph nodes of multiple regions: Secondary | ICD-10-CM | POA: Diagnosis not present

## 2016-10-10 LAB — CBC
HCT: 34.9 % — ABNORMAL LOW (ref 35.0–47.0)
HEMOGLOBIN: 12.2 g/dL (ref 12.0–16.0)
MCH: 30.8 pg (ref 26.0–34.0)
MCHC: 34.9 g/dL (ref 32.0–36.0)
MCV: 88.1 fL (ref 80.0–100.0)
Platelets: 169 10*3/uL (ref 150–440)
RBC: 3.96 MIL/uL (ref 3.80–5.20)
RDW: 14.8 % — ABNORMAL HIGH (ref 11.5–14.5)
WBC: 3.5 10*3/uL — ABNORMAL LOW (ref 3.6–11.0)

## 2016-10-11 ENCOUNTER — Ambulatory Visit
Admission: RE | Admit: 2016-10-11 | Discharge: 2016-10-11 | Disposition: A | Discharge status: Home / Self Care | Payer: Medicare Other | Source: Ambulatory Visit | Attending: Radiation Oncology | Admitting: Radiation Oncology

## 2016-10-11 ENCOUNTER — Inpatient Hospital Stay: Payer: Medicare Other

## 2016-10-11 DIAGNOSIS — C50212 Malignant neoplasm of upper-inner quadrant of left female breast: Secondary | ICD-10-CM

## 2016-10-11 DIAGNOSIS — C778 Secondary and unspecified malignant neoplasm of lymph nodes of multiple regions: Secondary | ICD-10-CM | POA: Diagnosis not present

## 2016-10-11 MED ORDER — HEPARIN SOD (PORK) LOCK FLUSH 100 UNIT/ML IV SOLN
500.0000 [IU] | Freq: Once | INTRAVENOUS | Status: AC
  Administered 2016-10-11: 500 [IU] via INTRAVENOUS

## 2016-10-11 MED ORDER — HEPARIN SOD (PORK) LOCK FLUSH 100 UNIT/ML IV SOLN
INTRAVENOUS | Status: AC
  Filled 2016-10-11: qty 5

## 2016-10-11 MED ORDER — SODIUM CHLORIDE 0.9% FLUSH
10.0000 mL | Freq: Once | INTRAVENOUS | Status: AC
Start: 2016-10-11 — End: 2016-10-11
  Administered 2016-10-11: 10 mL via INTRAVENOUS
  Filled 2016-10-11: qty 10

## 2016-10-12 ENCOUNTER — Ambulatory Visit
Admission: RE | Admit: 2016-10-12 | Discharge: 2016-10-12 | Disposition: A | Discharge status: Home / Self Care | Payer: Medicare Other | Source: Ambulatory Visit | Attending: Radiation Oncology | Admitting: Radiation Oncology

## 2016-10-12 ENCOUNTER — Ambulatory Visit: Payer: Medicare Other

## 2016-10-12 DIAGNOSIS — C778 Secondary and unspecified malignant neoplasm of lymph nodes of multiple regions: Secondary | ICD-10-CM | POA: Diagnosis not present

## 2016-10-14 DIAGNOSIS — Z7189 Other specified counseling: Secondary | ICD-10-CM | POA: Insufficient documentation

## 2016-10-14 NOTE — Progress Notes (Signed)
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  INTERVAL HISTORY: Patient returns to clinic today for routine 3 month follow-up. She has now completed her adjuvant XRT and tolerated it well without significant side effects. She has an intermittent headache, but denies any peripheral neuropathy or other neurologic complaints. She denies any recent fevers or illnesses. She has a good appetite and denies weight loss. She denies any pain. She denies any chest pain or shortness of breath. She denies any nausea, vomiting, constipation, or diarrhea. She has no urinary complaints. Patient offers no specific complaints today.  REVIEW OF SYSTEMS:   Review of Systems  Constitutional: Negative for fever, malaise/fatigue and weight loss.  Respiratory: Negative.  Negative for cough and shortness of breath.   Cardiovascular: Negative.  Negative for chest pain and leg swelling.  Gastrointestinal: Negative.  Negative for abdominal pain and nausea.  Genitourinary: Negative.   Musculoskeletal: Negative.   Neurological: Negative.  Negative for sensory change and weakness.  Psychiatric/Behavioral: Negative.  The patient is not nervous/anxious.    As per HPI. Otherwise, a complete review of systems is negative.   PAST MEDICAL HISTORY: Past Medical History:  Diagnosis Date  . Anemia   . Anemia due to chemotherapy   . Cancer (HCC)    breast  . Generalized OA   . GERD (gastroesophageal reflux disease)   . Hypercholesteremia   . Hypertension   . Hypothyroid   . Migraine   . Osteoporosis   . Stroke ([place])   . TIA (transient ischemic attack)    July    PAST SURGICAL HISTORY: Past  Surgical History:  Procedure Laterality Date  . BREAST BIOPSY Left    bx/clip-neg  . BREAST BIOPSY Left 12/15/2015   path pending/us and stereo bx  . CATARACT EXTRACTION    . DILATION AND CURETTAGE OF UTERUS    . ENDOSCOPIC RETROGRADE CHOLANGIOPANCREATOGRAPHY (ERCP) WITH PROPOFOL N/A 06/19/2016   Procedure: ENDOSCOPIC RETROGRADE CHOLANGIOPANCREATOGRAPHY (ERCP) WITH PROPOFOL;  Surgeon: Lucilla Lame, MD;  Location: [facility] ENDOSCOPY;  Service: Endoscopy;  Laterality: N/A;  . EYE SURGERY Right    Cataract Extraction with IOL  . PARTIAL MASTECTOMY WITH AXILLARY SENTINEL LYMPH NODE BIOPSY Left 01/13/2016   Procedure: PARTIAL MASTECTOMY;  Surgeon: Leonie Green, MD;  Location: [facility] ORS;  Service: General;  Laterality: Left;  . PORTACATH PLACEMENT Right 01/13/2016   Procedure: INSERTION PORT-A-CATH;  Surgeon: Leonie Green, MD;  Location: [facility] ORS;  Service: General;  Laterality: Right;  . RETINAL DETACHMENT SURGERY Right    Dr. Starling Manns, [facility]  . TONSILLECTOMY      FAMILY HISTORY Family History  Problem Relation Age of Onset  . Hypertension Mother   . Osteoarthritis Mother   . Dementia Father   . Breast cancer Cousin 27    mat cousin       ADVANCED DIRECTIVES:    HEALTH MAINTENANCE: Social History  Substance Use Topics  . Smoking status: Never Smoker  . Smokeless tobacco: Never Used  . Alcohol use No     Colonoscopy:  PAP:  Bone density:  Lipid panel:  No Known Allergies  Current Outpatient Prescriptions  Medication Sig Dispense Refill  . atorvastatin (LIPITOR) 80 MG tablet Take 80 mg  by mouth daily.    . Cholecalciferol (VITAMIN D3) 5000 units CAPS Take 1 capsule by mouth every other day.    . hydrochlorothiazide (MICROZIDE) 12.5 MG capsule Take 12.5 mg by mouth daily.    Marland Kitchen HYDROcodone-acetaminophen (NORCO/VICODIN) 5-325 MG tablet 1-2 tabs po qd prn for pain/headache 6 tablet 0  . levothyroxine (SYNTHROID, LEVOTHROID) 50 MCG tablet Take 1 tablet by mouth  daily.  0  . ondansetron (ZOFRAN ODT) 4 MG disintegrating tablet Take 1 tablet (4 mg total) by mouth every 8 (eight) hours as needed for nausea or vomiting. 20 tablet 0   No current facility-administered medications for this visit.     OBJECTIVE: Vitals:   10/16/16 1052  BP: 139/85  Pulse: 82  Resp: 18  Temp: 97.1 F (36.2 C)     Body mass index is 27.31 kg/m.    ECOG FS:0 - Asymptomatic  General: Well-developed, well-nourished, no acute distress. Eyes: Pink conjunctiva, anicteric sclera. Breasts: Patient requested exam be deferred today. Lungs: Clear to auscultation bilaterally. Heart: Regular rate and rhythm. No rubs, murmurs, or gallops. Abdomen: Soft, nontender, nondistended. No organomegaly noted, normoactive bowel sounds. Musculoskeletal: No edema, cyanosis, or clubbing. Neuro: Alert, answering all questions appropriately. Cranial nerves grossly intact. Skin: No rashes or petechiae noted. Psych: Normal affect.   LAB RESULTS:  Lab Results  Component Value Date   NA 136 10/16/2016   K 3.5 10/16/2016   CL 103 10/16/2016   CO2 27 10/16/2016   GLUCOSE 122 (H) 10/16/2016   BUN 13 10/16/2016   CREATININE 0.82 10/16/2016   CALCIUM 9.3 10/16/2016   PROT 7.1 10/16/2016   ALBUMIN 4.1 10/16/2016   AST 23 10/16/2016   ALT 18 10/16/2016   ALKPHOS 104 10/16/2016   BILITOT 0.8 10/16/2016   GFRNONAA >60 10/16/2016   GFRAA >60 10/16/2016    Lab Results  Component Value Date   WBC 3.1 (L) 10/16/2016   NEUTROABS 2.0 10/16/2016   HGB 12.3 10/16/2016   HCT 34.9 (L) 10/16/2016   MCV 88.1 10/16/2016   PLT 174 10/16/2016     STUDIES: No results found.  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  PLAN:    1. 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 She has now completed adjuvant XRT. An aromatase inhibitor would offer no benefit given the triple negative status of her disease. No further intervention is needed at this time. Return to clinic in 3 months for repeat labs and further evaluation.  2.  Hypertension: Patient's blood pressure is essentially within normal limits today. Continue current medications. 3.  Anemia: Secondary chemotherapy, monitor. Patient's hemoglobin has increased slightly today.  Monitor.  4.  Hypokalemia: Patient's potassium is now within normal limits, continue oral supplementation. 5.  Leukopenia: Mild, monitor 6.  Gallstones: Patient has not undergone cholecystectomy, but reports the plan is still to remove her gallbladder in the near future. She has preadmission testing on November 02, 2016.  7. Headache: Patient was referred to neurology by primary care for further evaluation.  Patient expressed understanding and was in agreement with this plan. She also understands that She can call clinic at any time with any questions, concerns, or complaints.   Breast cancer of upper-inner quadrant of left female breast [facility])   Staging form: Breast, AJCC 7th  Edition     Pathologic stage from 01/06/2016: Stage IIA (T1c, N1a, M0) - Signed by Lloyd Huger, MD on 01/06/2016   Lloyd Huger, MD 10/16/16 11:11 AM

## 2016-10-15 ENCOUNTER — Ambulatory Visit
Admission: RE | Admit: 2016-10-15 | Discharge: 2016-10-15 | Disposition: A | Discharge status: Home / Self Care | Payer: Medicare Other | Source: Ambulatory Visit | Attending: Radiation Oncology | Admitting: Radiation Oncology

## 2016-10-15 ENCOUNTER — Ambulatory Visit: Payer: Medicare Other

## 2016-10-15 DIAGNOSIS — C778 Secondary and unspecified malignant neoplasm of lymph nodes of multiple regions: Secondary | ICD-10-CM | POA: Diagnosis not present

## 2016-10-16 ENCOUNTER — Ambulatory Visit
Admission: RE | Admit: 2016-10-16 | Discharge: 2016-10-16 | Disposition: A | Discharge status: Home / Self Care | Payer: Medicare Other | Source: Ambulatory Visit | Attending: Radiation Oncology | Admitting: Radiation Oncology

## 2016-10-16 ENCOUNTER — Encounter: Payer: Self-pay | Admitting: *Deleted

## 2016-10-16 ENCOUNTER — Encounter: Payer: Self-pay | Admitting: Oncology

## 2016-10-16 ENCOUNTER — Inpatient Hospital Stay (HOSPITAL_BASED_OUTPATIENT_CLINIC_OR_DEPARTMENT_OTHER): Payer: Medicare Other | Admitting: Oncology

## 2016-10-16 ENCOUNTER — Ambulatory Visit: Payer: Medicare Other

## 2016-10-16 ENCOUNTER — Inpatient Hospital Stay: Payer: Medicare Other

## 2016-10-16 ENCOUNTER — Emergency Department
Admission: EM | Admit: 2016-10-16 | Discharge: 2016-10-17 | Disposition: A | Discharge status: Home / Self Care | Payer: Medicare Other | Attending: Emergency Medicine | Admitting: Emergency Medicine

## 2016-10-16 ENCOUNTER — Emergency Department: Payer: Medicare Other

## 2016-10-16 VITALS — BP 139/85 | HR 82 | Temp 97.1°F | Resp 18 | Ht 62.0 in | Wt 149.3 lb

## 2016-10-16 DIAGNOSIS — Z452 Encounter for adjustment and management of vascular access device: Secondary | ICD-10-CM | POA: Diagnosis not present

## 2016-10-16 DIAGNOSIS — C778 Secondary and unspecified malignant neoplasm of lymph nodes of multiple regions: Secondary | ICD-10-CM | POA: Diagnosis not present

## 2016-10-16 DIAGNOSIS — I10 Essential (primary) hypertension: Secondary | ICD-10-CM

## 2016-10-16 DIAGNOSIS — K8080 Other cholelithiasis without obstruction: Secondary | ICD-10-CM

## 2016-10-16 DIAGNOSIS — Z853 Personal history of malignant neoplasm of breast: Secondary | ICD-10-CM | POA: Insufficient documentation

## 2016-10-16 DIAGNOSIS — Z171 Estrogen receptor negative status [ER-]: Secondary | ICD-10-CM

## 2016-10-16 DIAGNOSIS — R51 Headache: Secondary | ICD-10-CM | POA: Diagnosis not present

## 2016-10-16 DIAGNOSIS — M818 Other osteoporosis without current pathological fracture: Secondary | ICD-10-CM

## 2016-10-16 DIAGNOSIS — C50212 Malignant neoplasm of upper-inner quadrant of left female breast: Secondary | ICD-10-CM

## 2016-10-16 DIAGNOSIS — E039 Hypothyroidism, unspecified: Secondary | ICD-10-CM | POA: Insufficient documentation

## 2016-10-16 DIAGNOSIS — R519 Headache, unspecified: Secondary | ICD-10-CM

## 2016-10-16 DIAGNOSIS — D72819 Decreased white blood cell count, unspecified: Secondary | ICD-10-CM

## 2016-10-16 DIAGNOSIS — K219 Gastro-esophageal reflux disease without esophagitis: Secondary | ICD-10-CM | POA: Diagnosis not present

## 2016-10-16 DIAGNOSIS — Z803 Family history of malignant neoplasm of breast: Secondary | ICD-10-CM

## 2016-10-16 DIAGNOSIS — E7800 Pure hypercholesterolemia, unspecified: Secondary | ICD-10-CM

## 2016-10-16 DIAGNOSIS — Z8669 Personal history of other diseases of the nervous system and sense organs: Secondary | ICD-10-CM | POA: Diagnosis not present

## 2016-10-16 DIAGNOSIS — D6481 Anemia due to antineoplastic chemotherapy: Secondary | ICD-10-CM | POA: Diagnosis not present

## 2016-10-16 DIAGNOSIS — Z79899 Other long term (current) drug therapy: Secondary | ICD-10-CM

## 2016-10-16 DIAGNOSIS — Z8673 Personal history of transient ischemic attack (TIA), and cerebral infarction without residual deficits: Secondary | ICD-10-CM

## 2016-10-16 DIAGNOSIS — Z7189 Other specified counseling: Secondary | ICD-10-CM

## 2016-10-16 LAB — CBC WITH DIFFERENTIAL/PLATELET
Basophils Absolute: 0 10*3/uL (ref 0–0.1)
Basophils Relative: 1 %
Eosinophils Absolute: 0.2 10*3/uL (ref 0–0.7)
Eosinophils Relative: 7 %
HEMATOCRIT: 34.9 % — AB (ref 35.0–47.0)
Hemoglobin: 12.3 g/dL (ref 12.0–16.0)
LYMPHS ABS: 0.7 10*3/uL — AB (ref 1.0–3.6)
LYMPHS PCT: 21 %
MCH: 31.1 pg (ref 26.0–34.0)
MCHC: 35.3 g/dL (ref 32.0–36.0)
MCV: 88.1 fL (ref 80.0–100.0)
MONO ABS: 0.3 10*3/uL (ref 0.2–0.9)
MONOS PCT: 8 %
NEUTROS ABS: 2 10*3/uL (ref 1.4–6.5)
Neutrophils Relative %: 63 %
PLATELETS: 174 10*3/uL (ref 150–440)
RBC: 3.96 MIL/uL (ref 3.80–5.20)
RDW: 15.1 % — AB (ref 11.5–14.5)
WBC: 3.1 10*3/uL — ABNORMAL LOW (ref 3.6–11.0)

## 2016-10-16 LAB — COMPREHENSIVE METABOLIC PANEL
ALT: 18 U/L (ref 14–54)
AST: 23 U/L (ref 15–41)
Albumin: 4.1 g/dL (ref 3.5–5.0)
Alkaline Phosphatase: 104 U/L (ref 38–126)
Anion gap: 6 (ref 5–15)
BILIRUBIN TOTAL: 0.8 mg/dL (ref 0.3–1.2)
BUN: 13 mg/dL (ref 6–20)
CHLORIDE: 103 mmol/L (ref 101–111)
CO2: 27 mmol/L (ref 22–32)
Calcium: 9.3 mg/dL (ref 8.9–10.3)
Creatinine, Ser: 0.82 mg/dL (ref 0.44–1.00)
GLUCOSE: 122 mg/dL — AB (ref 65–99)
POTASSIUM: 3.5 mmol/L (ref 3.5–5.1)
Sodium: 136 mmol/L (ref 135–145)
Total Protein: 7.1 g/dL (ref 6.5–8.1)

## 2016-10-16 LAB — URINALYSIS, COMPLETE (UACMP) WITH MICROSCOPIC
BACTERIA UA: NONE SEEN
Bilirubin Urine: NEGATIVE
Glucose, UA: NEGATIVE mg/dL
HGB URINE DIPSTICK: NEGATIVE
Ketones, ur: NEGATIVE mg/dL
NITRITE: NEGATIVE
Protein, ur: NEGATIVE mg/dL
SPECIFIC GRAVITY, URINE: 1.015 (ref 1.005–1.030)
pH: 5 (ref 5.0–8.0)

## 2016-10-16 LAB — SAMPLE TO BLOOD BANK

## 2016-10-16 MED ORDER — SODIUM CHLORIDE 0.9 % IV BOLUS (SEPSIS): 1000.0000 mL | Freq: Once | INTRAVENOUS | Status: DC

## 2016-10-16 NOTE — ED Notes (Signed)
Patient left for CT scan. 

## 2016-10-16 NOTE — Progress Notes (Signed)
Pt cont to have HA off and on each day. She has been referred to neurology.

## 2016-10-16 NOTE — ED Provider Notes (Addendum)
[facility] Emergency Department Provider Note  ____________________________________________   I have reviewed the triage vital signs and the nursing notes.   HISTORY  Chief Complaint Headache    HPI Erica Levy is a 76 y.o. female who presents today complaining of 6 weeks of headache. Patient does have a history of breast cancer. She's been off chemotherapy since December she still gets radiation apparently. Had  blood work at already today in routine appointments. Her headache at this time is "not so bad". Patient does have a history of chronic headaches and has had multiple CT scans for headaches in the past bleeding headaches here. Patient has no neurologic deficit. The headache is a frontal pressure. She's been on 2 different courses of antibiotics for this even though she has no sinus drainage or discharge or other symptoms of sinusitis such as fever. In any event, this antibiotic courses did not help her headache. The headache comes and goes. Nothing makes it worse, it better when she takes Percogesic which she did before coming in. She is told her cancer doctor about this and has an outpatient appointment with neurology for her chronic headache but she elected to come in here tonight. The headache is not markedly worse infected this time and is somewhat better. She is very anxious about it she states. This is similar to multiple prior headaches and multiple prior headaches leading to CT she states. He tells me she has seen 3 different doctors and mentioned the headaches each one but none of them appear in a satisfactory answer and she is eager to have more information. She denies any focal numbness or weakness or fever or chills or nausea or vomiting or diarrhea or abdominal pain or rash or stiff neck or difficulty speaking or walking or change in vision or any other associated symptoms with this persistent headache.     Past Medical History:  Diagnosis Date   . Anemia   . Anemia due to chemotherapy   . Cancer (HCC)    breast  . Generalized OA   . GERD (gastroesophageal reflux disease)   . Hypercholesteremia   . Hypertension   . Hypothyroid   . Migraine   . Osteoporosis   . Stroke ([place])   . TIA (transient ischemic attack)    July    Patient Active Problem List   Diagnosis Date Noted  . Goals of care, counseling/discussion 10/14/2016  . Bilateral upper abdominal pain 06/17/2016  . Cholecystitis, acute 06/17/2016  . Elevated troponin 06/17/2016  . Acute cholecystitis 06/17/2016  . Chest pain 06/06/2016  . Breast cancer of upper-inner quadrant of left female breast ([place]) 01/06/2016  . CVA (cerebral infarction) 02/09/2015    Past Surgical History:  Procedure Laterality Date  . BREAST BIOPSY Left    bx/clip-neg  . BREAST BIOPSY Left 12/15/2015   path pending/us and stereo bx  . CATARACT EXTRACTION    . DILATION AND CURETTAGE OF UTERUS    . ENDOSCOPIC RETROGRADE CHOLANGIOPANCREATOGRAPHY (ERCP) WITH PROPOFOL N/A 06/19/2016   Procedure: ENDOSCOPIC RETROGRADE CHOLANGIOPANCREATOGRAPHY (ERCP) WITH PROPOFOL;  Surgeon: Lucilla Lame, MD;  Location: [facility] ENDOSCOPY;  Service: Endoscopy;  Laterality: N/A;  . EYE SURGERY Right    Cataract Extraction with IOL  . PARTIAL MASTECTOMY WITH AXILLARY SENTINEL LYMPH NODE BIOPSY Left 01/13/2016   Procedure: PARTIAL MASTECTOMY;  Surgeon: Leonie Green, MD;  Location: [facility] ORS;  Service: General;  Laterality: Left;  . PORTACATH PLACEMENT Right 01/13/2016   Procedure: INSERTION PORT-A-CATH;  Surgeon: Leonie Green, MD;  Location: [facility] ORS;  Service: General;  Laterality: Right;  . RETINAL DETACHMENT SURGERY Right    Dr. Starling Manns, [facility]  . TONSILLECTOMY      Prior to Admission medications   Medication Sig Start Date End Date Taking? Authorizing Provider  atorvastatin (LIPITOR) 80 MG tablet Take 80 mg by mouth daily.    Historical Provider, MD  Cholecalciferol (VITAMIN D3) 5000 units CAPS  Take 1 capsule by mouth every other day.    Historical Provider, MD  hydrochlorothiazide (MICROZIDE) 12.5 MG capsule Take 12.5 mg by mouth daily.    Historical Provider, MD  HYDROcodone-acetaminophen (NORCO/VICODIN) 5-325 MG tablet 1-2 tabs po qd prn for pain/headache 10/06/16   Norval Gable, MD  levothyroxine (SYNTHROID, LEVOTHROID) 50 MCG tablet Take 1 tablet by mouth daily. 01/08/16   Historical Provider, MD  ondansetron (ZOFRAN ODT) 4 MG disintegrating tablet Take 1 tablet (4 mg total) by mouth every 8 (eight) hours as needed for nausea or vomiting. 06/22/16   Gladstone Lighter, MD    Allergies Patient has no known allergies.  Family History  Problem Relation Age of Onset  . Hypertension Mother   . Osteoarthritis Mother   . Dementia Father   . Breast cancer Cousin 32    mat cousin    Social History Social History  Substance Use Topics  . Smoking status: Never Smoker  . Smokeless tobacco: Never Used  . Alcohol use No    Review of Systems Constitutional: No fever/chills Eyes: No visual changes. ENT: No sore throat. No stiff neck no neck pain Cardiovascular: Denies chest pain. Respiratory: Denies shortness of breath. Gastrointestinal:   no vomiting.  No diarrhea.  No constipation. Genitourinary: Negative for dysuria. Musculoskeletal: Negative lower extremity swelling Skin: Negative for rash. Neurological: Negative for severe headaches, focal weakness or numbness. 10-point ROS otherwise negative.  ____________________________________________   PHYSICAL EXAM:  VITAL SIGNS: ED Triage Vitals  Enc Vitals Group     BP 10/16/16 2205 (!) 144/99     Pulse Rate 10/16/16 2205 95     Resp 10/16/16 2205 20     Temp 10/16/16 2205 98.2 F (36.8 C)     Temp Source 10/16/16 2205 Oral     SpO2 10/16/16 2205 98 %     Weight 10/16/16 2206 149 lb (67.6 kg)     Height 10/16/16 2206 5\' 2"  (1.575 m)     Head Circumference --      Peak Flow --      Pain Score 10/16/16 2206 8      Pain Loc --      Pain Edu? --      Excl. in [place]? --     Constitutional: Alert and oriented. Well appearing and in no acute distress. Eyes: Conjunctivae are normal. PERRL. EOMI. Head: Atraumatic. Nose: No congestion/rhinnorhea. Mouth/Throat: Mucous membranes are moist.  Oropharynx non-erythematous. Neck: No stridor.   Nontender with no meningismus Cardiovascular: Normal rate, regular rhythm. Grossly normal heart sounds.  Good peripheral circulation. Respiratory: Normal respiratory effort.  No retractions. Lungs CTAB. Abdominal: Soft and nontender. No distention. No guarding no rebound Back:  There is no focal tenderness or step off.  there is no midline tenderness there are no lesions noted. there is no CVA tenderness Musculoskeletal: No lower extremity tenderness, no upper extremity tenderness. No joint effusions, no DVT signs strong distal pulses no edema Neurologic:  Normal speech and language. No gross focal neurologic deficits are appreciated.  Skin:  Skin  is warm, dry and intact. No rash noted. Psychiatric: Mood and affect are normal. Speech and behavior are normal.  ____________________________________________   LABS (all labs ordered are listed, but only abnormal results are displayed)  Labs Reviewed  URINALYSIS, COMPLETE (UACMP) WITH MICROSCOPIC   ____________________________________________  EKG  I personally interpreted any EKGs ordered by me or triage  ____________________________________________  RADIOLOGY  I reviewed any imaging ordered by me or triage that were performed during my shift and, if possible, patient and/or family made aware of any abnormal findings. ____________________________________________   PROCEDURES  Procedure(s) performed: None  Procedures  Critical Care performed: None  ____________________________________________   INITIAL IMPRESSION / ASSESSMENT AND PLAN / ED COURSE  Pertinent labs & imaging results that were available during  my care of the patient were reviewed by me and considered in my medical decision making (see chart for details).  I have been able to review the patient's blood work already which looks good, unclear why she has had this headache which is quite chronic. She has had no change in diet no caffeine, no neurologic deficits to suggest bleed in the time is off of that nothing to suggest meningitis and the timing is off of that as well, nothing to suggest cavernous thrombosis, nothing to suggest temporal arteritis she has no tenderness to palpation along the temples, metastatic disease is possible rebound headaches from analgesias possible there are many things that could cause this. If her CT is negative however have a splint to her that we will likely have to defer to outpatient neurologic follow-up.  ----------------------------------------- 12:04 AM on 10/17/2016 -----------------------------------------  Patient is very relieved that there is no obvious pathology on CT. She states that mostly she was worried that she might have metastases to her brain. I have assured her that there is nothing evident on CT scan, but that MRI is a better test than she does need a follow-up very closely with neurology as already scheduled as well as her primary care doctor. Patient states her headache is still not significant and she is requesting discharge before tonight's snowstorm. She declines further workup and I don't see any evidence of any acute pathology today. Patient has had symptoms for 6 weeks and seen multiple doctors for this and has a negative CT with a normal neurologic exam and no evidence of acute infection    ____________________________________________   FINAL CLINICAL IMPRESSION(S) / ED DIAGNOSES  Final diagnoses:  None      This chart was dictated using voice recognition software.  Despite best efforts to proofread,  errors can occur which can change meaning.      Schuyler Amor,  MD 10/16/16 [place], MD 10/17/16 0005

## 2016-10-16 NOTE — ED Triage Notes (Signed)
Pt ambulatory to triage.  Pt has intermittent headaches for 6 weeks.  No n/v/d.  Pt alert.  Speech clear.

## 2016-10-16 NOTE — ED Notes (Signed)
Patient is back from CT. Patient is drinking water from large [place] cup at this time.

## 2016-10-17 ENCOUNTER — Ambulatory Visit
Admission: RE | Admit: 2016-10-17 | Discharge: 2016-10-17 | Disposition: A | Discharge status: Home / Self Care | Payer: Medicare Other | Source: Ambulatory Visit | Attending: Radiation Oncology | Admitting: Radiation Oncology

## 2016-10-17 ENCOUNTER — Ambulatory Visit: Payer: Medicare Other

## 2016-10-17 DIAGNOSIS — C778 Secondary and unspecified malignant neoplasm of lymph nodes of multiple regions: Secondary | ICD-10-CM | POA: Diagnosis not present

## 2016-10-18 ENCOUNTER — Ambulatory Visit: Payer: Medicare Other | Admitting: Oncology

## 2016-10-18 ENCOUNTER — Other Ambulatory Visit: Payer: Medicare Other

## 2016-10-18 ENCOUNTER — Encounter: Payer: Self-pay | Admitting: *Deleted

## 2016-10-18 ENCOUNTER — Ambulatory Visit
Admission: RE | Admit: 2016-10-18 | Discharge: 2016-10-18 | Disposition: A | Discharge status: Home / Self Care | Payer: Medicare Other | Source: Ambulatory Visit | Attending: Radiation Oncology | Admitting: Radiation Oncology

## 2016-10-18 DIAGNOSIS — C778 Secondary and unspecified malignant neoplasm of lymph nodes of multiple regions: Secondary | ICD-10-CM | POA: Diagnosis not present

## 2016-10-18 NOTE — Progress Notes (Signed)
  Oncology Nurse Navigator Documentation  Navigator Location: [facility] (10/18/16 1200)   )Navigator Encounter Type: Letter/Fax/Email (10/18/16 1200)        Mailed patient a thinking of you card.  She is completing radiation therapy.  She is to call if she has any questions or needs.                                            Time Spent with Patient: 15 (10/18/16 1200)

## 2016-10-19 ENCOUNTER — Ambulatory Visit
Admission: RE | Admit: 2016-10-19 | Discharge: 2016-10-19 | Disposition: A | Discharge status: Home / Self Care | Payer: Medicare Other | Source: Ambulatory Visit | Attending: Radiation Oncology | Admitting: Radiation Oncology

## 2016-10-19 ENCOUNTER — Ambulatory Visit: Payer: Medicare Other

## 2016-10-19 DIAGNOSIS — C778 Secondary and unspecified malignant neoplasm of lymph nodes of multiple regions: Secondary | ICD-10-CM | POA: Diagnosis not present

## 2016-10-22 DIAGNOSIS — Z5321 Procedure and treatment not carried out due to patient leaving prior to being seen by health care provider: Secondary | ICD-10-CM | POA: Insufficient documentation

## 2016-10-22 DIAGNOSIS — R51 Headache: Secondary | ICD-10-CM | POA: Insufficient documentation

## 2016-10-22 DIAGNOSIS — I10 Essential (primary) hypertension: Secondary | ICD-10-CM | POA: Diagnosis not present

## 2016-10-22 DIAGNOSIS — E039 Hypothyroidism, unspecified: Secondary | ICD-10-CM | POA: Diagnosis not present

## 2016-10-22 NOTE — ED Triage Notes (Addendum)
Patient ambulatory to triage with steady gait, without difficulty or distress noted; pt reports generalized HA accomp by nausea x 7wks; seen here recently for same with negative CT; pt A&Ox3, MAEW, PERRL; st no new symptoms just that the pain won't go away

## 2016-10-23 ENCOUNTER — Emergency Department
Admission: EM | Admit: 2016-10-23 | Discharge: 2016-10-23 | Disposition: A | Discharge status: Home / Self Care | Payer: Medicare Other | Attending: Emergency Medicine | Admitting: Emergency Medicine

## 2016-10-23 NOTE — ED Notes (Signed)
No answer when called from lobby for recheck 

## 2016-11-02 ENCOUNTER — Encounter
Admission: RE | Admit: 2016-11-02 | Discharge: 2016-11-02 | Disposition: A | Discharge status: Home / Self Care | Payer: Medicare Other | Source: Ambulatory Visit | Attending: Surgery | Admitting: Surgery

## 2016-11-02 DIAGNOSIS — C7931 Secondary malignant neoplasm of brain: Secondary | ICD-10-CM | POA: Diagnosis not present

## 2016-11-02 DIAGNOSIS — R410 Disorientation, unspecified: Secondary | ICD-10-CM | POA: Diagnosis not present

## 2016-11-02 HISTORY — DX: Calculus of gallbladder without cholecystitis without obstruction: K80.20

## 2016-11-02 HISTORY — DX: Anxiety disorder, unspecified: F41.9

## 2016-11-02 HISTORY — DX: Type 2 diabetes mellitus without complications: E11.9

## 2016-11-02 NOTE — Patient Instructions (Signed)
Your procedure is scheduled on: 11/09/16 Fri Report to Same Day Surgery 2nd floor medical mall [facility] Entrance-take elevator on left to 2nd floor.  Check in with surgery information desk.) To find out your arrival time please call [phone] between 1PM - 3PM on Thurs 11/08/16  Remember: Instructions that are not followed completely may result in serious medical risk, up to and including death, or upon the discretion of your surgeon and anesthesiologist your surgery may need to be rescheduled.    _x___ 1. Do not eat food or drink liquids after midnight. No gum chewing or                              hard candies.     __x__ 2. No Alcohol for 24 hours before or after surgery.   __x__3. No Smoking for 24 prior to surgery.   ____  4. Bring all medications with you on the day of surgery if instructed.    __x__ 5. Notify your doctor if there is any change in your medical condition     (cold, fever, infections).     Do not wear jewelry, make-up, hairpins, clips or nail polish.  Do not wear lotions, powders, or perfumes. You may wear deodorant.  Do not shave 48 hours prior to surgery. Men may shave face and neck.  Do not bring valuables to the hospital.    [facility] is not responsible for any belongings or valuables.               Contacts, dentures or bridgework may not be worn into surgery.  Leave your suitcase in the car. After surgery it may be brought to your room.  For patients admitted to the hospital, discharge time is determined by your                       treatment team.   Patients discharged the day of surgery will not be allowed to drive home.  You will need someone to drive you home and stay with you the night of your procedure.    Please read over the following fact sheets that you were given:   [facility] Preparing for Surgery and or MRSA Information   _x___ Take anti-hypertensive (unless it includes a diuretic), cardiac, seizure, asthma,     anti-reflux and  psychiatric medicines. These include:  1. levothyroxine (SYNTHROID  2.HYDROcodone-acetaminophen (NORCO/VICODIN)  If needed for pain  3.  4.  5.  6.  ____Fleets enema or Magnesium Citrate as directed.   _x___ Use CHG Soap or sage wipes as directed on instruction sheet   ____ Use inhalers on the day of surgery and bring to hospital day of surgery  ____ Stop Metformin and Janumet 2 days prior to surgery.    ____ Take 1/2 of usual insulin dose the night before surgery and none on the morning     surgery.   _x___ Follow recommendations from Cardiologist, Pulmonologist or PCP regarding          stopping Aspirin, Coumadin, Pllavix ,Eliquis, Effient, or Pradaxa, and Pletal.  X____Stop Anti-inflammatories such as Advil, Aleve, Ibuprofen, Motrin, Naproxen, Naprosyn, Goodies powders or aspirin products. OK to take Tylenol and                          Celebrex.   _x___ Stop supplements until after surgery.  But may continue Vitamin D, Vitamin B,       and multivitamin.   ____ Bring C-Pap to the hospital.

## 2016-11-03 ENCOUNTER — Emergency Department
Admission: EM | Admit: 2016-11-03 | Discharge: 2016-11-04 | Disposition: A | Discharge status: Home / Self Care | Payer: Medicare Other | Source: Home / Self Care | Attending: Emergency Medicine | Admitting: Emergency Medicine

## 2016-11-03 ENCOUNTER — Emergency Department: Payer: Medicare Other

## 2016-11-03 ENCOUNTER — Other Ambulatory Visit: Payer: Self-pay

## 2016-11-03 ENCOUNTER — Encounter: Payer: Self-pay | Admitting: Emergency Medicine

## 2016-11-03 DIAGNOSIS — Y999 Unspecified external cause status: Secondary | ICD-10-CM | POA: Insufficient documentation

## 2016-11-03 DIAGNOSIS — R42 Dizziness and giddiness: Secondary | ICD-10-CM

## 2016-11-03 DIAGNOSIS — R51 Headache: Secondary | ICD-10-CM

## 2016-11-03 DIAGNOSIS — W1809XA Striking against other object with subsequent fall, initial encounter: Secondary | ICD-10-CM | POA: Insufficient documentation

## 2016-11-03 DIAGNOSIS — E876 Hypokalemia: Secondary | ICD-10-CM

## 2016-11-03 DIAGNOSIS — E039 Hypothyroidism, unspecified: Secondary | ICD-10-CM

## 2016-11-03 DIAGNOSIS — Y929 Unspecified place or not applicable: Secondary | ICD-10-CM | POA: Insufficient documentation

## 2016-11-03 DIAGNOSIS — Z79899 Other long term (current) drug therapy: Secondary | ICD-10-CM | POA: Insufficient documentation

## 2016-11-03 DIAGNOSIS — E119 Type 2 diabetes mellitus without complications: Secondary | ICD-10-CM | POA: Insufficient documentation

## 2016-11-03 DIAGNOSIS — Y939 Activity, unspecified: Secondary | ICD-10-CM

## 2016-11-03 DIAGNOSIS — I10 Essential (primary) hypertension: Secondary | ICD-10-CM

## 2016-11-03 DIAGNOSIS — Z853 Personal history of malignant neoplasm of breast: Secondary | ICD-10-CM | POA: Insufficient documentation

## 2016-11-03 DIAGNOSIS — R519 Headache, unspecified: Secondary | ICD-10-CM

## 2016-11-03 LAB — COMPREHENSIVE METABOLIC PANEL
ALBUMIN: 4.4 g/dL (ref 3.5–5.0)
ALT: 35 U/L (ref 14–54)
ANION GAP: 9 (ref 5–15)
AST: 35 U/L (ref 15–41)
Alkaline Phosphatase: 74 U/L (ref 38–126)
BUN: 31 mg/dL — ABNORMAL HIGH (ref 6–20)
CHLORIDE: 99 mmol/L — AB (ref 101–111)
CO2: 26 mmol/L (ref 22–32)
Calcium: 9.6 mg/dL (ref 8.9–10.3)
Creatinine, Ser: 0.87 mg/dL (ref 0.44–1.00)
GFR calc Af Amer: >60 mL/min (ref >60)
GFR calc non Af Amer: >60 mL/min (ref >60)
GLUCOSE: 154 mg/dL — AB (ref 65–99)
POTASSIUM: 3.1 mmol/L — AB (ref 3.5–5.1)
SODIUM: 134 mmol/L — AB (ref 135–145)
TOTAL PROTEIN: 7 g/dL (ref 6.5–8.1)
Total Bilirubin: 1.8 mg/dL — ABNORMAL HIGH (ref 0.3–1.2)

## 2016-11-03 LAB — CBC WITH DIFFERENTIAL/PLATELET
BASOS ABS: 0 10*3/uL (ref 0–0.1)
BASOS PCT: 0 %
EOS ABS: 0 10*3/uL (ref 0–0.7)
Eosinophils Relative: 0 %
HCT: 38.9 % (ref 35.0–47.0)
Hemoglobin: 14.1 g/dL (ref 12.0–16.0)
Lymphocytes Relative: 10 %
Lymphs Abs: 0.6 10*3/uL — ABNORMAL LOW (ref 1.0–3.6)
MCH: 31.6 pg (ref 26.0–34.0)
MCHC: 36.3 g/dL — AB (ref 32.0–36.0)
MCV: 87 fL (ref 80.0–100.0)
MONO ABS: 0.6 10*3/uL (ref 0.2–0.9)
MONOS PCT: 9 %
NEUTROS PCT: 81 %
Neutro Abs: 4.7 10*3/uL (ref 1.4–6.5)
PLATELETS: 197 10*3/uL (ref 150–440)
RBC: 4.47 MIL/uL (ref 3.80–5.20)
RDW: 15.5 % — AB (ref 11.5–14.5)
WBC: 5.8 10*3/uL (ref 3.6–11.0)

## 2016-11-03 LAB — TROPONIN I: Troponin I: <0.03 ng/mL (ref <0.03)

## 2016-11-03 LAB — LIPASE, BLOOD: Lipase: <10 U/L — ABNORMAL LOW (ref 11–51)

## 2016-11-03 MED ORDER — SODIUM CHLORIDE 0.9 % IV BOLUS (SEPSIS)
500.0000 mL | Freq: Once | INTRAVENOUS | Status: AC
  Administered 2016-11-03: 500 mL via INTRAVENOUS

## 2016-11-03 MED ORDER — POTASSIUM CHLORIDE CRYS ER 20 MEQ PO TBCR
40.0000 meq | EXTENDED_RELEASE_TABLET | Freq: Once | ORAL | Status: AC
  Administered 2016-11-04: 40 meq via ORAL
  Filled 2016-11-03: qty 2

## 2016-11-03 MED ORDER — DIPHENHYDRAMINE HCL 50 MG/ML IJ SOLN
25.0000 mg | Freq: Once | INTRAMUSCULAR | Status: AC
  Administered 2016-11-03: 25 mg via INTRAVENOUS
  Filled 2016-11-03: qty 1

## 2016-11-03 MED ORDER — MECLIZINE HCL 25 MG PO TABS
25.0000 mg | ORAL_TABLET | Freq: Once | ORAL | Status: AC
  Administered 2016-11-03: 25 mg via ORAL
  Filled 2016-11-03: qty 1

## 2016-11-03 MED ORDER — PROCHLORPERAZINE EDISYLATE 5 MG/ML IJ SOLN
10.0000 mg | Freq: Once | INTRAMUSCULAR | Status: AC
  Administered 2016-11-03: 10 mg via INTRAVENOUS
  Filled 2016-11-03: qty 2

## 2016-11-03 MED ORDER — KETOROLAC TROMETHAMINE 30 MG/ML IJ SOLN
30.0000 mg | Freq: Once | INTRAMUSCULAR | Status: AC
  Administered 2016-11-03: 30 mg via INTRAVENOUS
  Filled 2016-11-03: qty 1

## 2016-11-03 NOTE — ED Triage Notes (Signed)
Pt arrives via [facility] with c/o fall this AM. Pt states that she did hit her head but denies LOC. Pt reports that she has been feeling weak x1 week. Per EMS, pt VS 156/60 BP, 97% RA, 99 PR, 97.8 Temp, and 216 CBG. Pt is in NAD at this time.

## 2016-11-03 NOTE — ED Provider Notes (Signed)
[facility] Emergency Department Provider Note  ____________________________________________   First MD Initiated Contact with Patient 11/03/16 2046     (approximate)  I have reviewed the triage vital signs and the nursing notes.   HISTORY  Chief Complaint Fall   HPI Erica Levy is a 76 y.o. female with a history of diabetes as well as breast cancer on chemotherapy was presenting to the emergency department today after a fall this morning. She said that she lost her balance and fell, hitting her head but not losing consciousness. She says that since falling she has had a right-sided headache which she describes as a 10 out of 10. She says that she also has a history of headaches has been taking medication for headaches for the past several weeks. She says that she does have vomiting with the headaches. Denies any blurred vision with the headaches. Says that she is chronic and bilateral pain in her hips but did not fall on her hips when she fell today. Denies any abdominal pain. Denies any chest pain. Says that she was also vertiginous after her fall but that she has a chronic history of vertigo. Denies any vertigo will rest urine output says it worsens when she gets up and walks. Says that she is also been ringing in her ears which has been ongoing for years.Patient also with vomiting 2 over the past day. Denies any diarrhea.   Past Medical History:  Diagnosis Date  . Anemia   . Anemia due to chemotherapy   . Anxiety   . Cancer ([place])    breast  . Diabetes mellitus without complication ([place])    diet controlled  . Gallbladder calculus   . Generalized OA   . GERD (gastroesophageal reflux disease)   . Hypercholesteremia   . Hypertension   . Hypothyroid   . Migraine   . Osteoporosis   . Stroke ([place])   . TIA (transient ischemic attack)    July    Patient Active Problem List   Diagnosis Date Noted  . Goals of care, counseling/discussion  10/14/2016  . Bilateral upper abdominal pain 06/17/2016  . Cholecystitis, acute 06/17/2016  . Elevated troponin 06/17/2016  . Acute cholecystitis 06/17/2016  . Chest pain 06/06/2016  . Breast cancer of upper-inner quadrant of left female breast ([place]) 01/06/2016  . CVA (cerebral infarction) 02/09/2015    Past Surgical History:  Procedure Laterality Date  . BREAST BIOPSY Left    bx/clip-neg  . BREAST BIOPSY Left 12/15/2015   path pending/us and stereo bx  . CATARACT EXTRACTION    . DILATION AND CURETTAGE OF UTERUS    . ENDOSCOPIC RETROGRADE CHOLANGIOPANCREATOGRAPHY (ERCP) WITH PROPOFOL N/A 06/19/2016   Procedure: ENDOSCOPIC RETROGRADE CHOLANGIOPANCREATOGRAPHY (ERCP) WITH PROPOFOL;  Surgeon: Lucilla Lame, MD;  Location: [facility] ENDOSCOPY;  Service: Endoscopy;  Laterality: N/A;  . EYE SURGERY Right    Cataract Extraction with IOL  . PARTIAL MASTECTOMY WITH AXILLARY SENTINEL LYMPH NODE BIOPSY Left 01/13/2016   Procedure: PARTIAL MASTECTOMY;  Surgeon: Leonie Green, MD;  Location: [facility] ORS;  Service: General;  Laterality: Left;  . PORTACATH PLACEMENT Right 01/13/2016   Procedure: INSERTION PORT-A-CATH;  Surgeon: Leonie Green, MD;  Location: [facility] ORS;  Service: General;  Laterality: Right;  . RETINAL DETACHMENT SURGERY Right    Dr. Starling Manns, [facility]  . TONSILLECTOMY      Prior to Admission medications   Medication Sig Start Date End Date Taking? Authorizing Provider  aspirin-acetaminophen-caffeine ([place]) [phone] MG  tablet Take 2 tablets by mouth every 4 (four) hours as needed for headache.    Historical Provider, MD  atorvastatin (LIPITOR) 80 MG tablet Take 80 mg by mouth daily at 6 PM.     Historical Provider, MD  Cholecalciferol (VITAMIN D3) 5000 units CAPS Take 1 capsule by mouth every other day.    Historical Provider, MD  Diphenhydramine-APAP (PERCOGESIC EXTRA STRENGTH) 12.5-500 MG TABS Take 2 tablets by mouth every 6 (six) hours as needed (headache, pain).     Historical Provider, MD  fluticasone (FLONASE) 50 MCG/ACT nasal spray Place 2 sprays into both nostrils 2 (two) times daily as needed for allergies or rhinitis.    Historical Provider, MD  hydrochlorothiazide (MICROZIDE) 12.5 MG capsule Take 12.5 mg by mouth daily.    Historical Provider, MD  HYDROcodone-acetaminophen (NORCO/VICODIN) 5-325 MG tablet 1-2 tabs po qd prn for pain/headache Patient taking differently: Take 1 tablet by mouth every 4 (four) hours as needed. pain/headache 10/06/16   Norval Gable, MD  ibuprofen (ADVIL,MOTRIN) 200 MG tablet Take 400 mg by mouth every 4 (four) hours as needed for mild pain.    Historical Provider, MD  levothyroxine (SYNTHROID, LEVOTHROID) 50 MCG tablet Take 1 tablet by mouth daily. 01/08/16   Historical Provider, MD  nortriptyline (PAMELOR) 10 MG capsule Take 10 mg by mouth at bedtime. 10/29/16   Historical Provider, MD  ondansetron (ZOFRAN ODT) 4 MG disintegrating tablet Take 1 tablet (4 mg total) by mouth every 8 (eight) hours as needed for nausea or vomiting. Patient not taking: Reported on 10/30/2016 06/22/16   Gladstone Lighter, MD  ondansetron (ZOFRAN) 8 MG tablet Take 8 mg by mouth every 8 (eight) hours as needed for nausea or vomiting.  10/15/16   Historical Provider, MD  predniSONE (DELTASONE) 10 MG tablet Take 10 mg by mouth daily with breakfast. Should be completed by 11-01-2016 10/23/16   Historical Provider, MD    Allergies Patient has no known allergies.  Family History  Problem Relation Age of Onset  . Hypertension Mother   . Osteoarthritis Mother   . Dementia Father   . Breast cancer Cousin 45    mat cousin    Social History Social History  Substance Use Topics  . Smoking status: Never Smoker  . Smokeless tobacco: Never Used  . Alcohol use No    Review of Systems Constitutional: No fever/chills Eyes: No visual changes. ENT: No sore throat. Cardiovascular: Denies chest pain. Respiratory: Denies shortness of  breath. Gastrointestinal: No abdominal pain.  No diarrhea.  No constipation. Genitourinary: Negative for dysuria. Musculoskeletal: Negative for back pain. Skin: Negative for rash. Neurological: Negative for focal weakness or numbness.  10-point ROS otherwise negative.  ____________________________________________   PHYSICAL EXAM:  VITAL SIGNS: ED Triage Vitals  Enc Vitals Group     BP 11/03/16 2046 (!) 156/60     Pulse Rate 11/03/16 2046 99     Resp --      Temp 11/03/16 2046 97.5 F (36.4 C)     Temp Source 11/03/16 2046 Oral     SpO2 11/03/16 2046 98 %     Weight 11/03/16 2043 142 lb (64.4 kg)     Height 11/03/16 2043 5\' 2"  (1.575 m)     Head Circumference --      Peak Flow --      Pain Score 11/03/16 2041 10     Pain Loc --      Pain Edu? --      Excl. in  GC? --     Constitutional: Alert and oriented. Well appearing and in no acute distress. Eyes: Conjunctivae are normal. PERRL. EOMI. Head: Atraumatic. Nose: No congestion/rhinnorhea. Mouth/Throat: Mucous membranes are moist.   Neck: No stridor.  Tenderness to the midline cervical spine. No deformity or step-off. Cardiovascular: Normal rate, regular rhythm. Grossly normal heart sounds.   Respiratory: Normal respiratory effort.  No retractions. Lungs CTAB. Gastrointestinal: Soft and nontender. No distention Musculoskeletal: No lower extremity tenderness nor edema.  No joint effusions. Neurologic:  Normal speech and language. No gross focal neurologic deficits are appreciated.  No ataxia on finger to nose testing. No nystagmus.  Skin:  Skin is warm, dry and intact. No rash noted. Psychiatric: Mood and affect are normal. Speech and behavior are normal.  ____________________________________________   LABS (all labs ordered are listed, but only abnormal results are displayed)  Labs Reviewed  CBC WITH DIFFERENTIAL/PLATELET - Abnormal; Notable for the following:       Result Value   MCHC 36.3 (*)    RDW 15.5 (*)     Lymphs Abs 0.6 (*)    All other components within normal limits  COMPREHENSIVE METABOLIC PANEL - Abnormal; Notable for the following:    Sodium 134 (*)    Potassium 3.1 (*)    Chloride 99 (*)    Glucose, Bld 154 (*)    BUN 31 (*)    Total Bilirubin 1.8 (*)    All other components within normal limits  LIPASE, BLOOD - Abnormal; Notable for the following:    Lipase <10 (*)    All other components within normal limits  TROPONIN I  URINALYSIS, COMPLETE (UACMP) WITH MICROSCOPIC   ____________________________________________  EKG  ED ECG REPORT I, Doran Stabler, the attending physician, personally viewed and interpreted this ECG.   Date: 11/03/2016  EKG Time: 2050  Rate: 95  Rhythm: normal sinus rhythm  Axis: Normal  Intervals:none  ST&T Change: No ST segment elevation or depression. No abnormal T-wave inversion.  ____________________________________________  MVHQIONGE  CT Head Wo Contrast (Final result)  Result time 11/03/16 21:43:27  Final result by Marin Olp, MD (11/03/16 21:43:27)           Narrative:   CLINICAL DATA: Fall this morning with generalized weakness 1 week. Right-sided headache. Breast cancer.  EXAM: CT HEAD WITHOUT CONTRAST  TECHNIQUE: Contiguous axial images were obtained from the base of the skull through the vertex without intravenous contrast.  COMPARISON: 10/16/2016 and 02/09/2015  FINDINGS: Brain: Ventricles, cisterns and other CSF spaces are within normal. No mass, mass effect, shift of midline structures or acute hemorrhage. No evidence of acute infarction. Minimal chronic ischemic microvascular disease.  Vascular: Calcified atherosclerotic plaque over the cavernous segment of the internal carotid arteries.  Skull: Within normal.  Sinuses/Orbits: Within normal.  Other: None.  IMPRESSION: No acute intracranial findings.  Mild chronic ischemic microvascular disease.   Electronically Signed By: Marin Olp  M.D. On: 11/03/2016 21:43            DG Chest 1 View (Final result)  Result time 11/03/16 21:13:59  Final result by Marin Olp, MD (11/03/16 21:13:59)           Narrative:   CLINICAL DATA: Fall this morning.  EXAM: CHEST 1 VIEW  COMPARISON: 06/06/2016  FINDINGS: Right IJ Port-A-Cath unchanged. Lungs are adequately inflated without consolidation, effusion or pneumothorax. Cardiomediastinal silhouette is normal. Calcified plaque over the thoracic aorta. Mild degenerate change of the spine.  IMPRESSION: No active  disease.   Electronically Signed By: Marin Olp M.D. On: 11/03/2016 21:13            DG Pelvis 1-2 Views (Final result)  Result time 11/03/16 21:16:21  Final result by Marin Olp, MD (11/03/16 21:16:21)           Narrative:   CLINICAL DATA: Fall this morning. Right hip pain.  EXAM: PELVIS - 1-2 VIEW  COMPARISON: 11/10/2015 and 06/28/2009  FINDINGS: There is no evidence of pelvic fracture or diastasis. No pelvic bone lesions are seen. Degenerative change of the spine.  IMPRESSION: No acute findings.   Electronically Signed By: Marin Olp M.D. On: 11/03/2016 21:16          ____________________________________________   PROCEDURES  Procedure(s) performed:   Procedures  Critical Care performed:   ____________________________________________   INITIAL IMPRESSION / ASSESSMENT AND PLAN / ED COURSE  Pertinent labs & imaging results that were available during my care of the patient were reviewed by me and considered in my medical decision making (see chart for details).  ----------------------------------------- 12:23 AM on 11/04/2016 -----------------------------------------  Patient with reassuring workup but still symptomatic with headache. Ordered migraine meds including Compazine, Toradol and diphenhydramine. Dr. Karma Greaser to reevaluate. Also still pending urine.  Clinical Course as of Nov 04 21  Sat Nov 03, 2016  2343 Assuming care from Dr. Clearnce Hasten.  In short, Erica Levy is a 76 y.o. female with a chief complaint of vertigo and recent falls.  Refer to the original H&P for additional details.  The current plan of care is to reassess after treatment.  Ordered some oral potassium as well.   [CF]    Clinical Course User Index [CF] Hinda Kehr, MD     ____________________________________________   FINAL CLINICAL IMPRESSION(S) / ED DIAGNOSES  Final diagnoses:  Vertigo  Hypokalemia  Headache.    NEW MEDICATIONS STARTED DURING THIS VISIT:  New Prescriptions   No medications on file     Note:  This document was prepared using Dragon voice recognition software and may include unintentional dictation errors.    Orbie Pyo, MD 11/04/16 [phone]

## 2016-11-04 ENCOUNTER — Inpatient Hospital Stay
Admission: EM | Admit: 2016-11-04 | Discharge: 2016-11-09 | DRG: 055 | Disposition: A | Discharge status: Skilled Nursing Facility | Payer: Medicare Other | Attending: Internal Medicine | Admitting: Internal Medicine

## 2016-11-04 ENCOUNTER — Other Ambulatory Visit: Payer: Self-pay

## 2016-11-04 ENCOUNTER — Emergency Department: Payer: Medicare Other

## 2016-11-04 ENCOUNTER — Encounter: Payer: Self-pay | Admitting: Emergency Medicine

## 2016-11-04 DIAGNOSIS — G910 Communicating hydrocephalus: Secondary | ICD-10-CM | POA: Diagnosis present

## 2016-11-04 DIAGNOSIS — E876 Hypokalemia: Secondary | ICD-10-CM | POA: Diagnosis present

## 2016-11-04 DIAGNOSIS — W010XXA Fall on same level from slipping, tripping and stumbling without subsequent striking against object, initial encounter: Secondary | ICD-10-CM | POA: Diagnosis present

## 2016-11-04 DIAGNOSIS — Y92019 Unspecified place in single-family (private) house as the place of occurrence of the external cause: Secondary | ICD-10-CM

## 2016-11-04 DIAGNOSIS — Z171 Estrogen receptor negative status [ER-]: Secondary | ICD-10-CM

## 2016-11-04 DIAGNOSIS — Z803 Family history of malignant neoplasm of breast: Secondary | ICD-10-CM

## 2016-11-04 DIAGNOSIS — E039 Hypothyroidism, unspecified: Secondary | ICD-10-CM | POA: Diagnosis present

## 2016-11-04 DIAGNOSIS — R410 Disorientation, unspecified: Secondary | ICD-10-CM

## 2016-11-04 DIAGNOSIS — G43909 Migraine, unspecified, not intractable, without status migrainosus: Secondary | ICD-10-CM | POA: Diagnosis present

## 2016-11-04 DIAGNOSIS — I10 Essential (primary) hypertension: Secondary | ICD-10-CM | POA: Diagnosis present

## 2016-11-04 DIAGNOSIS — R519 Headache, unspecified: Secondary | ICD-10-CM

## 2016-11-04 DIAGNOSIS — Z853 Personal history of malignant neoplasm of breast: Secondary | ICD-10-CM

## 2016-11-04 DIAGNOSIS — Z9181 History of falling: Secondary | ICD-10-CM

## 2016-11-04 DIAGNOSIS — Z8673 Personal history of transient ischemic attack (TIA), and cerebral infarction without residual deficits: Secondary | ICD-10-CM

## 2016-11-04 DIAGNOSIS — M810 Age-related osteoporosis without current pathological fracture: Secondary | ICD-10-CM | POA: Diagnosis present

## 2016-11-04 DIAGNOSIS — E119 Type 2 diabetes mellitus without complications: Secondary | ICD-10-CM

## 2016-11-04 DIAGNOSIS — C7932 Secondary malignant neoplasm of cerebral meninges: Secondary | ICD-10-CM | POA: Diagnosis present

## 2016-11-04 DIAGNOSIS — Z66 Do not resuscitate: Secondary | ICD-10-CM | POA: Diagnosis present

## 2016-11-04 DIAGNOSIS — R4182 Altered mental status, unspecified: Secondary | ICD-10-CM | POA: Diagnosis present

## 2016-11-04 DIAGNOSIS — Z9221 Personal history of antineoplastic chemotherapy: Secondary | ICD-10-CM

## 2016-11-04 DIAGNOSIS — E7800 Pure hypercholesterolemia, unspecified: Secondary | ICD-10-CM | POA: Diagnosis present

## 2016-11-04 DIAGNOSIS — G131 Other systemic atrophy primarily affecting central nervous system in neoplastic disease: Secondary | ICD-10-CM | POA: Diagnosis present

## 2016-11-04 DIAGNOSIS — Z923 Personal history of irradiation: Secondary | ICD-10-CM

## 2016-11-04 DIAGNOSIS — Z8249 Family history of ischemic heart disease and other diseases of the circulatory system: Secondary | ICD-10-CM

## 2016-11-04 DIAGNOSIS — M25551 Pain in right hip: Secondary | ICD-10-CM | POA: Diagnosis present

## 2016-11-04 DIAGNOSIS — M25552 Pain in left hip: Secondary | ICD-10-CM | POA: Diagnosis present

## 2016-11-04 DIAGNOSIS — R531 Weakness: Secondary | ICD-10-CM

## 2016-11-04 DIAGNOSIS — M159 Polyosteoarthritis, unspecified: Secondary | ICD-10-CM | POA: Diagnosis present

## 2016-11-04 DIAGNOSIS — R296 Repeated falls: Secondary | ICD-10-CM | POA: Diagnosis present

## 2016-11-04 DIAGNOSIS — Z515 Encounter for palliative care: Secondary | ICD-10-CM

## 2016-11-04 DIAGNOSIS — K219 Gastro-esophageal reflux disease without esophagitis: Secondary | ICD-10-CM | POA: Diagnosis present

## 2016-11-04 DIAGNOSIS — R51 Headache: Secondary | ICD-10-CM

## 2016-11-04 DIAGNOSIS — Z9012 Acquired absence of left breast and nipple: Secondary | ICD-10-CM

## 2016-11-04 DIAGNOSIS — G8929 Other chronic pain: Secondary | ICD-10-CM

## 2016-11-04 DIAGNOSIS — G9340 Encephalopathy, unspecified: Secondary | ICD-10-CM | POA: Diagnosis present

## 2016-11-04 DIAGNOSIS — C7931 Secondary malignant neoplasm of brain: Secondary | ICD-10-CM

## 2016-11-04 LAB — COMPREHENSIVE METABOLIC PANEL
ALT: 34 U/L (ref 14–54)
ANION GAP: 8 (ref 5–15)
AST: 29 U/L (ref 15–41)
Albumin: 3.9 g/dL (ref 3.5–5.0)
Alkaline Phosphatase: 71 U/L (ref 38–126)
BILIRUBIN TOTAL: 1.5 mg/dL — AB (ref 0.3–1.2)
BUN: 26 mg/dL — ABNORMAL HIGH (ref 6–20)
CHLORIDE: 102 mmol/L (ref 101–111)
CO2: 23 mmol/L (ref 22–32)
Calcium: 8.9 mg/dL (ref 8.9–10.3)
Creatinine, Ser: 0.71 mg/dL (ref 0.44–1.00)
GFR calc Af Amer: >60 mL/min (ref >60)
Glucose, Bld: 186 mg/dL — ABNORMAL HIGH (ref 65–99)
POTASSIUM: 3.5 mmol/L (ref 3.5–5.1)
Sodium: 133 mmol/L — ABNORMAL LOW (ref 135–145)
TOTAL PROTEIN: 6.5 g/dL (ref 6.5–8.1)

## 2016-11-04 LAB — CBC WITH DIFFERENTIAL/PLATELET
Basophils Absolute: 0 10*3/uL (ref 0–0.1)
Basophils Relative: 0 %
EOS PCT: 0 %
Eosinophils Absolute: 0 10*3/uL (ref 0–0.7)
HEMATOCRIT: 37.2 % (ref 35.0–47.0)
Hemoglobin: 13.3 g/dL (ref 12.0–16.0)
Lymphocytes Relative: 9 %
Lymphs Abs: 0.7 10*3/uL — ABNORMAL LOW (ref 1.0–3.6)
MCH: 31.4 pg (ref 26.0–34.0)
MCHC: 35.8 g/dL (ref 32.0–36.0)
MCV: 87.8 fL (ref 80.0–100.0)
MONO ABS: 0.6 10*3/uL (ref 0.2–0.9)
MONOS PCT: 7 %
NEUTROS ABS: 7.1 10*3/uL — AB (ref 1.4–6.5)
Neutrophils Relative %: 84 %
PLATELETS: 177 10*3/uL (ref 150–440)
RBC: 4.23 MIL/uL (ref 3.80–5.20)
RDW: 15.8 % — AB (ref 11.5–14.5)
WBC: 8.4 10*3/uL (ref 3.6–11.0)

## 2016-11-04 LAB — URINALYSIS, COMPLETE (UACMP) WITH MICROSCOPIC
BACTERIA UA: NONE SEEN
BILIRUBIN URINE: NEGATIVE
Bacteria, UA: NONE SEEN
Bilirubin Urine: NEGATIVE
GLUCOSE, UA: NEGATIVE mg/dL
GLUCOSE, UA: 50 mg/dL — AB
HGB URINE DIPSTICK: NEGATIVE
Ketones, ur: 5 mg/dL — AB
Ketones, ur: 5 mg/dL — AB
LEUKOCYTES UA: NEGATIVE
Leukocytes, UA: NEGATIVE
NITRITE: NEGATIVE
NITRITE: NEGATIVE
PH: 5 (ref 5.0–8.0)
PROTEIN: NEGATIVE mg/dL
Protein, ur: NEGATIVE mg/dL
SPECIFIC GRAVITY, URINE: 1.021 (ref 1.005–1.030)
SPECIFIC GRAVITY, URINE: 1.02 (ref 1.005–1.030)
pH: 5 (ref 5.0–8.0)

## 2016-11-04 LAB — BLOOD GAS, VENOUS
Acid-Base Excess: 0.4 mmol/L (ref 0.0–2.0)
Bicarbonate: 22.9 mmol/L (ref 20.0–28.0)
O2 Saturation: 97 %
PCO2 VEN: 30 mmHg — AB (ref 44.0–60.0)
PH VEN: 7.49 — AB (ref 7.250–7.430)
Patient temperature: 37
pO2, Ven: 83 mmHg — ABNORMAL HIGH (ref 32.0–45.0)

## 2016-11-04 LAB — TROPONIN I: Troponin I: <0.03 ng/mL (ref <0.03)

## 2016-11-04 LAB — LIPASE, BLOOD: LIPASE: 14 U/L (ref 11–51)

## 2016-11-04 LAB — AMMONIA: AMMONIA: 20 umol/L (ref 9–35)

## 2016-11-04 LAB — LACTIC ACID, PLASMA: LACTIC ACID, VENOUS: 1.1 mmol/L (ref 0.5–1.9)

## 2016-11-04 LAB — GLUCOSE, CAPILLARY: GLUCOSE-CAPILLARY: 168 mg/dL — AB (ref 65–99)

## 2016-11-04 MED ORDER — HEPARIN SOD (PORK) LOCK FLUSH 10 UNIT/ML IV SOLN
INTRAVENOUS | Status: AC
  Filled 2016-11-04: qty 1

## 2016-11-04 MED ORDER — SODIUM CHLORIDE 0.9 % IV BOLUS (SEPSIS)
500.0000 mL | Freq: Once | INTRAVENOUS | Status: AC
  Administered 2016-11-04: 500 mL via INTRAVENOUS

## 2016-11-04 MED ORDER — HEPARIN SOD (PORK) LOCK FLUSH 100 UNIT/ML IV SOLN
500.0000 [IU] | Freq: Once | INTRAVENOUS | Status: AC
  Administered 2016-11-04: 100 [IU] via INTRAVENOUS

## 2016-11-04 MED ORDER — HEPARIN SOD (PORK) LOCK FLUSH 100 UNIT/ML IV SOLN
INTRAVENOUS | Status: AC
  Administered 2016-11-04: 100 [IU] via INTRAVENOUS
  Filled 2016-11-04: qty 5

## 2016-11-04 MED ORDER — POTASSIUM CHLORIDE CRYS ER 20 MEQ PO TBCR: 40.0000 meq | EXTENDED_RELEASE_TABLET | Freq: Once | ORAL | Status: DC

## 2016-11-04 NOTE — ED Triage Notes (Signed)
Pt presents to ED 12 via EMS c/o altered mental status; per family symptoms started around 1000 today; per family pt has not used the bathroom today, all the answers to all questions were "yes"; pt also had a fall around 1600 when family tried to get pt up to walk; pt has not had "but two bites of mashed potatoes to eat" today and about 500 ml of water to drink all day; at this time, pt is responding with an of "Saturday" to all questions, pt is cooperative when asked to do something as in hold breath while x-ray was being taken; pt does not appear to be in distress at this time; pt's VS are WDL; family at bedside

## 2016-11-04 NOTE — ED Provider Notes (Signed)
[facility] Emergency Department Provider Note  ____________________________________________   First MD Initiated Contact with Patient 11/04/16 2133     (approximate)  I have reviewed the triage vital signs and the nursing notes.   HISTORY  Chief Complaint Altered Mental Status  EM caveat: The patient is confused, but his not answering questions appropriately or making sense  HPI Erica Levy is a 76 y.o. female history of cancer, diabetes, hypertension TIA and stroke  EMS reports that the patient was seen here yesterday for a fall. Today family has noticed that the patient has been confused, not answering questions appropriately, seeming very fatigued. The patient was noted by EMS to last the scene well potentially around noon, but reports that symptoms have been ongoing now for over 9 hours. The EMS prehospital stroke screen was negative.  Patient is unable to provide any history.  Spoke with daughter Lenna Sciara) at 945pm, reports patient has been having ongoing headaches for a month and she fell twice last 2 days, came to er yesterday. Seemed last night was confused with problems answering questions. Last night, same but with coaching able to answer some. Today, patient not able to answer questions, not evan able to say her birthday. Seems to be getting worse last 2 days.    Past Medical History:  Diagnosis Date  . Anemia   . Anemia due to chemotherapy   . Anxiety   . Cancer ([place])    breast  . Diabetes mellitus without complication ([place])    diet controlled  . Gallbladder calculus   . Generalized OA   . GERD (gastroesophageal reflux disease)   . Hypercholesteremia   . Hypertension   . Hypothyroid   . Migraine   . Osteoporosis   . Stroke ([place])   . TIA (transient ischemic attack)    July    Patient Active Problem List   Diagnosis Date Noted  . Goals of care, counseling/discussion 10/14/2016  . Bilateral upper abdominal pain 06/17/2016    . Cholecystitis, acute 06/17/2016  . Elevated troponin 06/17/2016  . Acute cholecystitis 06/17/2016  . Chest pain 06/06/2016  . 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 CVA (cerebral infarction) 02/09/2015    Past Surgical History:  Procedure Laterality Date  . BREAST BIOPSY Left    bx/clip-neg  . BREAST BIOPSY Left 12/15/2015   path pending/us and stereo bx  . CATARACT EXTRACTION    . DILATION AND CURETTAGE OF UTERUS    . ENDOSCOPIC RETROGRADE CHOLANGIOPANCREATOGRAPHY (ERCP) WITH PROPOFOL N/A 06/19/2016   Procedure: ENDOSCOPIC RETROGRADE CHOLANGIOPANCREATOGRAPHY (ERCP) WITH PROPOFOL;  Surgeon: Lucilla Lame, MD;  Location: [facility] ENDOSCOPY;  Service: Endoscopy;  Laterality: N/A;  . EYE SURGERY Right    Cataract Extraction with IOL  . PARTIAL MASTECTOMY WITH AXILLARY SENTINEL LYMPH NODE BIOPSY Left 01/13/2016   Procedure: PARTIAL MASTECTOMY;  Surgeon: Leonie Green, MD;  Location: [facility] ORS;  Service: General;  Laterality: Left;  . PORTACATH PLACEMENT Right 01/13/2016   Procedure: INSERTION PORT-A-CATH;  Surgeon: Leonie Green, MD;  Location: [facility] ORS;  Service: General;  Laterality: Right;  . RETINAL DETACHMENT SURGERY Right    Dr. Starling Manns, [facility]  . TONSILLECTOMY      Prior to Admission medications   Medication Sig Start Date End Date Taking? Authorizing Provider  aspirin-acetaminophen-caffeine (EXCEDRIN MIGRAINE) [id number] MG tablet Take 2 tablets by mouth every 4 (four) hours as needed for headache.    Historical Provider, MD  atorvastatin (  LIPITOR) 80 MG tablet Take 80 mg by mouth daily at 6 PM.     Historical Provider, MD  Cholecalciferol (VITAMIN D3) 5000 units CAPS Take 1 capsule by mouth every other day.    Historical Provider, MD  Diphenhydramine-APAP (PERCOGESIC EXTRA STRENGTH) 12.5-500 MG TABS Take 2 tablets by mouth every 6 (six) hours as needed (headache, pain).    Historical Provider, MD  fluticasone (FLONASE) 50  MCG/ACT nasal spray Place 2 sprays into both nostrils 2 (two) times daily as needed for allergies or rhinitis.    Historical Provider, MD  hydrochlorothiazide (MICROZIDE) 12.5 MG capsule Take 12.5 mg by mouth daily.    Historical Provider, MD  HYDROcodone-acetaminophen (NORCO/VICODIN) 5-325 MG tablet 1-2 tabs po qd prn for pain/headache Patient taking differently: Take 1 tablet by mouth every 4 (four) hours as needed. pain/headache 10/06/16   Norval Gable, MD  ibuprofen (ADVIL,MOTRIN) 200 MG tablet Take 400 mg by mouth every 4 (four) hours as needed for mild pain.    Historical Provider, MD  levothyroxine (SYNTHROID, LEVOTHROID) 50 MCG tablet Take 1 tablet by mouth daily. 01/08/16   Historical Provider, MD  nortriptyline (PAMELOR) 10 MG capsule Take 10 mg by mouth at bedtime. 10/29/16   Historical Provider, MD  ondansetron (ZOFRAN ODT) 4 MG disintegrating tablet Take 1 tablet (4 mg total) by mouth every 8 (eight) hours as needed for nausea or vomiting. Patient not taking: Reported on 10/30/2016 06/22/16   Gladstone Lighter, MD  ondansetron (ZOFRAN) 8 MG tablet Take 8 mg by mouth every 8 (eight) hours as needed for nausea or vomiting.  10/15/16   Historical Provider, MD  predniSONE (DELTASONE) 10 MG tablet Take 10 mg by mouth daily with breakfast. Should be completed by 11-01-2016 10/23/16   Historical Provider, MD    Allergies Patient has no known allergies.  Family History  Problem Relation Age of Onset  . Hypertension Mother   . Osteoarthritis Mother   . Dementia Father   . Breast cancer Cousin 3    mat cousin    Social History Social History  Substance Use Topics  . Smoking status: Never Smoker  . Smokeless tobacco: Never Used  . Alcohol use No    Review of Systems EM caveat ____________________________________________   PHYSICAL EXAM:  VITAL SIGNS: ED Triage Vitals [11/04/16 2133]  Enc Vitals Group     BP      Pulse      Resp      Temp      Temp src      SpO2 98 %      Weight      Height      Head Circumference      Peak Flow      Pain Score      Pain Loc      Pain Edu?      Excl. in [place]?     Constitutional: Alert and disriented. The patient answers "yes" to every question asked, and is clearly confused. She is in no distress, resting and does not appear in painful extremities or restaurant extremity. Rather pleasantly confused this probably Eyes: Conjunctivae are normal. PERRL. EOMI. Head: Atraumatic. Nose: No congestion/rhinnorhea. Mouth/Throat: Mucous membranes are moist.  Oropharynx non-erythematous. Neck: No stridor.  No meningismus or rigidity. Cardiovascular: Normal rate, regular rhythm. Grossly normal heart sounds.  Good peripheral circulation. Clean and intact right upper chest port. Respiratory: Normal respiratory effort.  No retractions. Lungs CTAB. Gastrointestinal: Soft and nontender. No distention. No rebound  or guarding. Musculoskeletal: No lower extremity tenderness nor edema.   Neurologic:  The patient answers "yes" to all questions including what her name is, her birthdate, etc. She seems delirious, occasionally aching at her wires. She does not follow commands, but will move all extremities without a notable gross deficit. Her extraocular movements are intact. Skin:  Skin is warm, dry and intact. No rash noted. Psychiatric: Mood and affect are normal. Speech and behavior are normal.  ____________________________________________   LABS (all labs ordered are listed, but only abnormal results are displayed)  Labs Reviewed  CBC WITH DIFFERENTIAL/PLATELET - Abnormal; Notable for the following:       Result Value   RDW 15.8 (*)    Neutro Abs 7.1 (*)    Lymphs Abs 0.7 (*)    All other components within normal limits  COMPREHENSIVE METABOLIC PANEL - Abnormal; Notable for the following:    Sodium 133 (*)    Glucose, Bld 186 (*)    BUN 26 (*)    Total Bilirubin 1.5 (*)    All other components within normal limits  BLOOD GAS, VENOUS  - Abnormal; Notable for the following:    pH, Ven 7.49 (*)    pCO2, Ven 30 (*)    pO2, Ven 83.0 (*)    All other components within normal limits  URINALYSIS, COMPLETE (UACMP) WITH MICROSCOPIC - Abnormal; Notable for the following:    Color, Urine YELLOW (*)    APPearance CLOUDY (*)    Glucose, UA 50 (*)    Hgb urine dipstick SMALL (*)    Ketones, ur 5 (*)    Squamous Epithelial / LPF 0-5 (*)    All other components within normal limits  GLUCOSE, CAPILLARY - Abnormal; Notable for the following:    Glucose-Capillary 168 (*)    All other components within normal limits  LIPASE, BLOOD  TROPONIN I  LACTIC ACID, PLASMA  AMMONIA  LACTIC ACID, PLASMA  CBG MONITORING, ED   ____________________________________________  EKG  ED ECG REPORT I, Tawana Pasch, the attending physician, personally viewed and interpreted this ECG.  Date: 11/04/2016 EKG Time: 2140 Rate: 65 Rhythm: normal sinus rhythm QRS Axis: normal Intervals: normal ST/T Wave abnormalities: normal Conduction Disturbances: none Narrative Interpretation: unremarkable  ____________________________________________  RADIOLOGY  Ct Head Wo Contrast  Result Date: 11/04/2016 CLINICAL DATA:  76 y/o F; increased confusion today. Fall yesterday. History of breast cancer. EXAM: CT HEAD WITHOUT CONTRAST TECHNIQUE: Contiguous axial images were obtained from the base of the skull through the vertex without intravenous contrast. COMPARISON:  11/03/2016 CT of the head. FINDINGS: Brain: No evidence of acute infarction, hemorrhage, hydrocephalus, extra-axial collection or mass lesion/mass effect. Stable mild parenchymal volume loss. Vascular: Extensive calcific atherosclerosis of the cavernous and paraclinoid internal carotid arteries. Skull: Normal. Negative for fracture or focal lesion. Sinuses/Orbits: No acute finding. Other: Right intra-ocular lens replacement. IMPRESSION: 1. No acute intracranial abnormality identified. 2. Stable mild  parenchymal volume loss. Electronically Signed   By: Kristine Garbe M.D.   On: 11/04/2016 22:30   Dg Chest Port 1 View  Result Date: 11/04/2016 CLINICAL DATA:  Weakness and altered mental status. EXAM: PORTABLE CHEST 1 VIEW COMPARISON:  11/03/2016 FINDINGS: Right IJ Port-A-Cath unchanged. Lungs are adequately inflated without airspace consolidation or effusion. Cardiomediastinal silhouette is within normal. There is calcified plaque over the thoracic aorta. There are mild degenerative changes of the spine. IMPRESSION: No acute cardiopulmonary disease. Aortic atherosclerosis. Electronically Signed   By: Marin Olp M.D.  On: 11/04/2016 22:39    ____________________________________________   PROCEDURES  Procedure(s) performed: None  Procedures  Critical Care performed: No  ____________________________________________   INITIAL IMPRESSION / ASSESSMENT AND PLAN / ED COURSE  Pertinent labs & imaging results that were available during my care of the patient were reviewed by me and considered in my medical decision making (see chart for details).  Patient presents for altered mental status. A worsening altered mental status, appears to slowly progress her last 48 hours. The patient is under cancer care and treatment. She was seen last night in the ER, and her symptoms have continued to progress. Hemodynamic was stable, no obvious evidence of acute infectious etiology. CBC and metabolic panel without obvious cause for confusion/altered mental status. No evidence of acute cardiac or pulmonary disease. No hypoxia.  ----------------------------------------- 11:34 PM on 11/04/2016 -----------------------------------------  Patient workup thus far largely unremarkable. Given her history of cancer, we'll admit to the hospitalist service for further workup. Consider MRI of the brain and neurology consultation discussed with Dr. Jannifer Franklin who will be admitting.        ____________________________________________   FINAL CLINICAL IMPRESSION(S) / ED DIAGNOSES  Final diagnoses:  Confusion  Delirium      NEW MEDICATIONS STARTED DURING THIS VISIT:  New Prescriptions   No medications on file     Note:  This document was prepared using Dragon voice recognition software and may include unintentional dictation errors.     Delman Kitten, MD 11/04/16 [phone]

## 2016-11-04 NOTE — ED Notes (Signed)
In and Out cath was preformed by this tech and Beth V. EDT assisting

## 2016-11-04 NOTE — ED Notes (Signed)
When asked by this nurse, Are you in pain? Pt. Answered "Yes".  I asked can you point to where it hurts "pt. Responded "no"

## 2016-11-05 ENCOUNTER — Inpatient Hospital Stay
Admit: 2016-11-05 | Discharge: 2016-11-05 | Disposition: A | Discharge status: Home / Self Care | Payer: Medicare Other | Attending: Internal Medicine | Admitting: Internal Medicine

## 2016-11-05 ENCOUNTER — Inpatient Hospital Stay: Payer: Medicare Other

## 2016-11-05 DIAGNOSIS — I10 Essential (primary) hypertension: Secondary | ICD-10-CM | POA: Diagnosis not present

## 2016-11-05 DIAGNOSIS — E119 Type 2 diabetes mellitus without complications: Secondary | ICD-10-CM | POA: Diagnosis not present

## 2016-11-05 DIAGNOSIS — E7800 Pure hypercholesterolemia, unspecified: Secondary | ICD-10-CM

## 2016-11-05 DIAGNOSIS — K8080 Other cholelithiasis without obstruction: Secondary | ICD-10-CM

## 2016-11-05 DIAGNOSIS — C50919 Malignant neoplasm of unspecified site of unspecified female breast: Secondary | ICD-10-CM | POA: Diagnosis not present

## 2016-11-05 DIAGNOSIS — E039 Hypothyroidism, unspecified: Secondary | ICD-10-CM | POA: Diagnosis not present

## 2016-11-05 DIAGNOSIS — M1990 Unspecified osteoarthritis, unspecified site: Secondary | ICD-10-CM | POA: Diagnosis not present

## 2016-11-05 DIAGNOSIS — F419 Anxiety disorder, unspecified: Secondary | ICD-10-CM | POA: Diagnosis not present

## 2016-11-05 DIAGNOSIS — K219 Gastro-esophageal reflux disease without esophagitis: Secondary | ICD-10-CM | POA: Diagnosis present

## 2016-11-05 DIAGNOSIS — D6481 Anemia due to antineoplastic chemotherapy: Secondary | ICD-10-CM

## 2016-11-05 DIAGNOSIS — G9340 Encephalopathy, unspecified: Secondary | ICD-10-CM | POA: Diagnosis present

## 2016-11-05 DIAGNOSIS — R4182 Altered mental status, unspecified: Secondary | ICD-10-CM | POA: Diagnosis present

## 2016-11-05 DIAGNOSIS — Z171 Estrogen receptor negative status [ER-]: Secondary | ICD-10-CM

## 2016-11-05 LAB — CBC
HEMATOCRIT: 36.6 % (ref 35.0–47.0)
HEMOGLOBIN: 13.2 g/dL (ref 12.0–16.0)
MCH: 31.6 pg (ref 26.0–34.0)
MCHC: 36 g/dL (ref 32.0–36.0)
MCV: 87.7 fL (ref 80.0–100.0)
Platelets: 179 10*3/uL (ref 150–440)
RBC: 4.18 MIL/uL (ref 3.80–5.20)
RDW: 15.8 % — ABNORMAL HIGH (ref 11.5–14.5)
WBC: 9.1 10*3/uL (ref 3.6–11.0)

## 2016-11-05 LAB — LIPID PANEL
Cholesterol: 142 mg/dL (ref 0–200)
HDL: 52 mg/dL (ref >40)
LDL Cholesterol: 75 mg/dL (ref 0–99)
Total CHOL/HDL Ratio: 2.7 RATIO
Triglycerides: 74 mg/dL (ref <150)
VLDL: 15 mg/dL (ref 0–40)

## 2016-11-05 LAB — BASIC METABOLIC PANEL
ANION GAP: 7 (ref 5–15)
BUN: 21 mg/dL — ABNORMAL HIGH (ref 6–20)
CHLORIDE: 101 mmol/L (ref 101–111)
CO2: 25 mmol/L (ref 22–32)
Calcium: 8.9 mg/dL (ref 8.9–10.3)
Creatinine, Ser: 0.74 mg/dL (ref 0.44–1.00)
GFR calc Af Amer: >60 mL/min (ref >60)
GLUCOSE: 144 mg/dL — AB (ref 65–99)
POTASSIUM: 3.5 mmol/L (ref 3.5–5.1)
Sodium: 133 mmol/L — ABNORMAL LOW (ref 135–145)

## 2016-11-05 LAB — VITAMIN B12: VITAMIN B 12: 187 pg/mL (ref 180–914)

## 2016-11-05 LAB — ECHOCARDIOGRAM COMPLETE
Height: 62 in
Weight: 2272 oz

## 2016-11-05 MED ORDER — PREDNISONE 10 MG PO TABS: 10.0000 mg | ORAL_TABLET | Freq: Every day | ORAL | Status: DC

## 2016-11-05 MED ORDER — ONDANSETRON HCL 4 MG PO TABS
4.0000 mg | ORAL_TABLET | Freq: Four times a day (QID) | ORAL | Status: DC | PRN
  Filled 2016-11-05: qty 1

## 2016-11-05 MED ORDER — ACETAMINOPHEN 325 MG PO TABS
650.0000 mg | ORAL_TABLET | Freq: Four times a day (QID) | ORAL | Status: DC | PRN
  Administered 2016-11-08 – 2016-11-09 (×3): 650 mg via ORAL
  Filled 2016-11-05 (×3): qty 2

## 2016-11-05 MED ORDER — LEVOTHYROXINE SODIUM 50 MCG PO TABS
50.0000 ug | ORAL_TABLET | Freq: Every day | ORAL | Status: DC
  Administered 2016-11-06 – 2016-11-09 (×4): 50 ug via ORAL
  Filled 2016-11-05 (×4): qty 1

## 2016-11-05 MED ORDER — DIPHENHYDRAMINE HCL 50 MG/ML IJ SOLN
12.5000 mg | Freq: Once | INTRAMUSCULAR | Status: AC
  Administered 2016-11-05: 12.5 mg via INTRAVENOUS

## 2016-11-05 MED ORDER — KETOROLAC TROMETHAMINE 30 MG/ML IJ SOLN
15.0000 mg | Freq: Once | INTRAMUSCULAR | Status: AC
  Administered 2016-11-05: 15 mg via INTRAVENOUS

## 2016-11-05 MED ORDER — LORAZEPAM 2 MG/ML IJ SOLN
2.0000 mg | Freq: Once | INTRAMUSCULAR | Status: AC
  Administered 2016-11-05: 2 mg via INTRAVENOUS
  Filled 2016-11-05: qty 1

## 2016-11-05 MED ORDER — ONDANSETRON HCL 4 MG/2ML IJ SOLN
4.0000 mg | Freq: Four times a day (QID) | INTRAMUSCULAR | Status: DC | PRN
  Administered 2016-11-09: 4 mg via INTRAVENOUS
  Filled 2016-11-05: qty 2

## 2016-11-05 MED ORDER — PROCHLORPERAZINE EDISYLATE 5 MG/ML IJ SOLN
5.0000 mg | Freq: Once | INTRAMUSCULAR | Status: AC
  Administered 2016-11-05: 5 mg via INTRAVENOUS

## 2016-11-05 MED ORDER — ORAL CARE MOUTH RINSE
15.0000 mL | Freq: Two times a day (BID) | OROMUCOSAL | Status: DC
  Administered 2016-11-05 – 2016-11-07 (×5): 15 mL via OROMUCOSAL

## 2016-11-05 MED ORDER — IOPAMIDOL (ISOVUE-300) INJECTION 61%
75.0000 mL | Freq: Once | INTRAVENOUS | Status: AC | PRN
Start: 2016-11-05 — End: 2016-11-05
  Administered 2016-11-05: 16:00:00 75 mL via INTRAVENOUS

## 2016-11-05 MED ORDER — STROKE: EARLY STAGES OF RECOVERY BOOK
Freq: Once | Status: AC
  Administered 2016-11-05: 04:00:00

## 2016-11-05 MED ORDER — ACETAMINOPHEN 650 MG RE SUPP: 650.0000 mg | Freq: Four times a day (QID) | RECTAL | Status: DC | PRN

## 2016-11-05 MED ORDER — ASPIRIN 300 MG RE SUPP
300.0000 mg | Freq: Every day | RECTAL | Status: DC
  Administered 2016-11-05: 12:00:00 300 mg via RECTAL
  Filled 2016-11-05: qty 1

## 2016-11-05 MED ORDER — NORTRIPTYLINE HCL 10 MG PO CAPS
10.0000 mg | ORAL_CAPSULE | Freq: Every day | ORAL | Status: DC
  Filled 2016-11-05: qty 1

## 2016-11-05 MED ORDER — KETOROLAC TROMETHAMINE 30 MG/ML IJ SOLN
INTRAMUSCULAR | Status: AC
  Administered 2016-11-05: 15 mg via INTRAVENOUS
  Filled 2016-11-05: qty 1

## 2016-11-05 MED ORDER — SODIUM CHLORIDE 0.9% FLUSH
3.0000 mL | Freq: Two times a day (BID) | INTRAVENOUS | Status: DC
  Administered 2016-11-05 – 2016-11-09 (×10): 3 mL via INTRAVENOUS

## 2016-11-05 MED ORDER — ASPIRIN 325 MG PO TABS
325.0000 mg | ORAL_TABLET | Freq: Every day | ORAL | Status: DC
  Administered 2016-11-06 – 2016-11-09 (×4): 325 mg via ORAL
  Filled 2016-11-05 (×4): qty 1

## 2016-11-05 MED ORDER — DIPHENHYDRAMINE HCL 50 MG/ML IJ SOLN
INTRAMUSCULAR | Status: AC
  Administered 2016-11-05: 12.5 mg via INTRAVENOUS
  Filled 2016-11-05: qty 1

## 2016-11-05 MED ORDER — DIAZEPAM 5 MG/ML IJ SOLN
5.0000 mg | Freq: Once | INTRAMUSCULAR | Status: DC
Start: 2016-11-05 — End: 2016-11-05

## 2016-11-05 MED ORDER — ATORVASTATIN CALCIUM 20 MG PO TABS
80.0000 mg | ORAL_TABLET | Freq: Every day | ORAL | Status: DC
  Administered 2016-11-06 – 2016-11-08 (×3): 80 mg via ORAL
  Filled 2016-11-05 (×3): qty 4

## 2016-11-05 MED ORDER — ENOXAPARIN SODIUM 40 MG/0.4ML ~~LOC~~ SOLN
40.0000 mg | SUBCUTANEOUS | Status: DC
  Administered 2016-11-05 – 2016-11-08 (×4): 40 mg via SUBCUTANEOUS
  Filled 2016-11-05 (×4): qty 0.4

## 2016-11-05 MED ORDER — PROCHLORPERAZINE EDISYLATE 5 MG/ML IJ SOLN
INTRAMUSCULAR | Status: AC
  Administered 2016-11-05: 5 mg via INTRAVENOUS
  Filled 2016-11-05: qty 2

## 2016-11-05 MED ORDER — CHLORHEXIDINE GLUCONATE 0.12 % MT SOLN
15.0000 mL | Freq: Two times a day (BID) | OROMUCOSAL | Status: DC
  Administered 2016-11-05 – 2016-11-09 (×8): 15 mL via OROMUCOSAL
  Filled 2016-11-05 (×8): qty 15

## 2016-11-05 NOTE — Progress Notes (Signed)
Erica Levy at [facility]                                                                                                                                                                                  Patient Demographics   Erica Levy, is a 76 y.o. female, DOB - 11-Apr-1941, FVC:[id number]  Admit date - 11/04/2016   Admitting Physician Lance Coon, MD  Outpatient Primary MD for the patient is [name], Erica Och, NP   LOS - 0  Subjective: Pt admited with confusion, still confused Son is at the bedside.    Review of Systems:   CONSTITUTIONAL: Unable to provide due to confusion   Vitals:   Vitals:   11/05/16 0626 11/05/16 0830 11/05/16 1045 11/05/16 1241  BP: 104/80 (!) 132/56 126/78 (!) 112/95  Pulse: (!) 102 95 90 93  Resp: 16 18 18 18   Temp: 98.6 F (37 C)   97.8 F (36.6 C)  TempSrc: Oral   Axillary  SpO2: 98% 99% 100% 100%  Weight:      Height:        Wt Readings from Last 3 Encounters:  11/04/16 142 lb (64.4 kg)  11/03/16 142 lb (64.4 kg)  11/02/16 142 lb (64.4 kg)    No intake or output data in the 24 hours ending 11/05/16 1244  Physical Exam:   GENERAL: confused HEAD, EYES, EARS, NOSE AND THROAT: Atraumatic, normocephalic. Extraocular muscles are intact. Pupils equal and reactive to light. Sclerae anicteric. No conjunctival injection. No oro-pharyngeal erythema.  NECK: Supple. There is no jugular venous distention. No bruits, no lymphadenopathy, no thyromegaly.  HEART: Regular rate and rhythm,. No murmurs, no rubs, no clicks.  LUNGS: Clear to auscultation bilaterally. No rales or rhonchi. No wheezes.  ABDOMEN: Soft, flat, nontender, nondistended. Has good bowel sounds. No hepatosplenomegaly appreciated.  EXTREMITIES: No evidence of any cyanosis, clubbing, or peripheral edema.  +2 pedal and radial pulses bilaterally.  NEUROLOGIC: confused SKIN: Moist and warm with no rashes appreciated.  Psych: confused LN: No inguinal LN  enlargement    Antibiotics   Anti-infectives    None      Medications   Scheduled Meds: . aspirin  300 mg Rectal Daily   Or  . aspirin  325 mg Oral Daily  . atorvastatin  80 mg Oral q1800  . diazepam  5 mg Intravenous Once  . enoxaparin (LOVENOX) injection  40 mg Subcutaneous Q24H  . levothyroxine  50 mcg Oral QAC breakfast  . nortriptyline  10 mg Oral QHS  . predniSONE  10 mg Oral Q breakfast  . sodium chloride flush  3 mL Intravenous Q12H   Continuous Infusions:  PRN Meds:.acetaminophen **OR** acetaminophen, ondansetron **OR** ondansetron (ZOFRAN) IV   Data Review:   Micro Results No results found for this or any previous visit (from the past 240 hour(s)).  Radiology Reports Dg Chest 1 View  Result Date: 11/03/2016 CLINICAL DATA:  Fall this morning. EXAM: CHEST 1 VIEW COMPARISON:  06/06/2016 FINDINGS: Right IJ Port-A-Cath unchanged. Lungs are adequately inflated without consolidation, effusion or pneumothorax. Cardiomediastinal silhouette is normal. Calcified plaque over the thoracic aorta. Mild degenerate change of the spine. IMPRESSION: No active disease. Electronically Signed   By: Marin Olp M.D.   On: 11/03/2016 21:13   Dg Pelvis 1-2 Views  Result Date: 11/03/2016 CLINICAL DATA:  Fall this morning.  Right hip pain. EXAM: PELVIS - 1-2 VIEW COMPARISON:  11/10/2015 and 06/28/2009 FINDINGS: There is no evidence of pelvic fracture or diastasis. No pelvic bone lesions are seen. Degenerative change of the spine. IMPRESSION: No acute findings. Electronically Signed   By: Marin Olp M.D.   On: 11/03/2016 21:16   Ct Head Wo Contrast  Result Date: 11/04/2016 CLINICAL DATA:  76 y/o F; increased confusion today. Fall yesterday. History of breast cancer. EXAM: CT HEAD WITHOUT CONTRAST TECHNIQUE: Contiguous axial images were obtained from the base of the skull through the vertex without intravenous contrast. COMPARISON:  11/03/2016 CT of the head. FINDINGS: Brain: No evidence of  acute infarction, hemorrhage, hydrocephalus, extra-axial collection or mass lesion/mass effect. Stable mild parenchymal volume loss. Vascular: Extensive calcific atherosclerosis of the cavernous and paraclinoid internal carotid arteries. Skull: Normal. Negative for fracture or focal lesion. Sinuses/Orbits: No acute finding. Other: Right intra-ocular lens replacement. IMPRESSION: 1. No acute intracranial abnormality identified. 2. Stable mild parenchymal volume loss. Electronically Signed   By: Kristine Garbe M.D.   On: 11/04/2016 22:30   Ct Head Wo Contrast  Result Date: 11/03/2016 CLINICAL DATA:  Fall this morning with generalized weakness 1 week. Right-sided headache. Breast cancer. EXAM: CT HEAD WITHOUT CONTRAST TECHNIQUE: Contiguous axial images were obtained from the base of the skull through the vertex without intravenous contrast. COMPARISON:  10/16/2016 and 02/09/2015 FINDINGS: Brain: Ventricles, cisterns and other CSF spaces are within normal. No mass, mass effect, shift of midline structures or acute hemorrhage. No evidence of acute infarction. Minimal chronic ischemic microvascular disease. Vascular: Calcified atherosclerotic plaque over the cavernous segment of the internal carotid arteries. Skull: Within normal. Sinuses/Orbits: Within normal. Other: None. IMPRESSION: No acute intracranial findings. Mild chronic ischemic microvascular disease. Electronically Signed   By: Marin Olp M.D.   On: 11/03/2016 21:43   Ct Head Wo Contrast  Result Date: 10/16/2016 CLINICAL DATA:  Headache history of breast cancer EXAM: CT HEAD WITHOUT CONTRAST TECHNIQUE: Contiguous axial images were obtained from the base of the skull through the vertex without intravenous contrast. COMPARISON:  06/01/2015 FINDINGS: Brain: No acute territorial infarction, intracranial hemorrhage or extra-axial fluid collection is seen. There is no focal mass, mass effect or midline shift. There is mild cortical atrophy. The  ventricles are non enlarged. Vascular: No hyperdense vessels.  Carotid artery calcifications. Skull: Normal. Negative for fracture or focal lesion. Sinuses/Orbits: No acute finding. Other: None IMPRESSION: No CT evidence for acute intracranial abnormality. Electronically Signed   By: Donavan Foil M.D.   On: 10/16/2016 23:27   US Carotid Bilateral (at [facility] Only)  Result Date: 11/05/2016 CLINICAL DATA:  Weakness EXAM: BILATERAL CAROTID DUPLEX ULTRASOUND TECHNIQUE: Pearline Cables scale imaging, color Doppler and duplex ultrasound were performed of bilateral carotid and vertebral arteries in the neck.  COMPARISON:  None. FINDINGS: Criteria: Quantification of carotid stenosis is based on velocity parameters that correlate the residual internal carotid diameter with NASCET-based stenosis levels, using the diameter of the distal internal carotid lumen as the denominator for stenosis measurement. The following velocity measurements were obtained: RIGHT ICA:  91 cm/sec CCA:  115 cm/sec SYSTOLIC ICA/CCA RATIO:  0.9 DIASTOLIC ICA/CCA RATIO:  2.1 ECA:  94 cm/sec LEFT ICA:  110 cm/sec CCA:  85 cm/sec SYSTOLIC ICA/CCA RATIO:  1.3 DIASTOLIC ICA/CCA RATIO:  1.9 ECA:  133 cm/sec RIGHT CAROTID ARTERY: Little if any plaque in the bulb. Low resistance internal carotid Doppler pattern. RIGHT VERTEBRAL ARTERY:  Antegrade. LEFT CAROTID ARTERY: Little if any plaque in the bulb. Low resistance internal carotid Doppler pattern. LEFT VERTEBRAL ARTERY:  Antegrade. IMPRESSION: Less than 50% stenosis in the right and left internal carotid arteries. Electronically Signed   By: Marybelle Killings M.D.   On: 11/05/2016 11:00   Dg Chest Port 1 View  Result Date: 11/04/2016 CLINICAL DATA:  Weakness and altered mental status. EXAM: PORTABLE CHEST 1 VIEW COMPARISON:  11/03/2016 FINDINGS: Right IJ Port-A-Cath unchanged. Lungs are adequately inflated without airspace consolidation or effusion. Cardiomediastinal silhouette is within normal. There is  calcified plaque over the thoracic aorta. There are mild degenerative changes of the spine. IMPRESSION: No acute cardiopulmonary disease. Aortic atherosclerosis. Electronically Signed   By: Marin Olp M.D.   On: 11/04/2016 22:39     CBC  Recent Labs Lab 11/03/16 2111 11/04/16 2153 11/05/16 0507  WBC 5.8 8.4 9.1  HGB 14.1 13.3 13.2  HCT 38.9 37.2 36.6  PLT 197 177 179  MCV 87.0 87.8 87.7  MCH 31.6 31.4 31.6  MCHC 36.3* 35.8 36.0  RDW 15.5* 15.8* 15.8*  LYMPHSABS 0.6* 0.7*  --   MONOABS 0.6 0.6  --   EOSABS 0.0 0.0  --   BASOSABS 0.0 0.0  --     Chemistries   Recent Labs Lab 11/03/16 2111 11/04/16 2153 11/05/16 0507  NA 134* 133* 133*  K 3.1* 3.5 3.5  CL 99* 102 101  CO2 26 23 25   GLUCOSE 154* 186* 144*  BUN 31* 26* 21*  CREATININE 0.87 0.71 0.74  CALCIUM 9.6 8.9 8.9  AST 35 29  --   ALT 35 34  --   ALKPHOS 74 71  --   BILITOT 1.8* 1.5*  --    ------------------------------------------------------------------------------------------------------------------ estimated creatinine clearance is 54.3 mL/min (by C-G formula based on SCr of 0.74 mg/dL). ------------------------------------------------------------------------------------------------------------------ No results for input(s): HGBA1C in the last 72 hours. ------------------------------------------------------------------------------------------------------------------  Recent Labs  11/05/16 0507  CHOL 142  HDL 52  LDLCALC 75  TRIG 74  CHOLHDL 2.7   ------------------------------------------------------------------------------------------------------------------ No results for input(s): TSH, T4TOTAL, T3FREE, THYROIDAB in the last 72 hours.  Invalid input(s): FREET3 ------------------------------------------------------------------------------------------------------------------ No results for input(s): VITAMINB12, FOLATE, FERRITIN, TIBC, IRON, RETICCTPCT in the last 72 hours.  Coagulation  profile No results for input(s): INR, PROTIME in the last 168 hours.  No results for input(s): DDIMER in the last 72 hours.  Cardiac Enzymes  Recent Labs Lab 11/03/16 2111 11/04/16 2153  TROPONINI <0.03 <0.03   ------------------------------------------------------------------------------------------------------------------ Invalid input(s): POCBNP    Assessment & Plan  Patient is a 76 year old admitted with acute encephalopathy 1.  Acute encephalopathy etiology unclear Patient getting an MRI of the brain as well as CTV to rule out thrombus No sign of infection, was taken off oTC for headache which has tyelnolol and antihistamin 2.   Hypothyroidism  continue syntrhoid 3.  Essential  HTN (hypertension) bp stable  4.   GERD (gastroesophageal reflux disease) not on any medication currently  5. Diabetes ([place]) start ssi      Code Status Orders        Start     Ordered   11/05/16 0318  Do not attempt resuscitation (DNR)  Continuous    Question Answer Comment  In the event of cardiac or respiratory ARREST Do not call a "code blue"   In the event of cardiac or respiratory ARREST Do not perform Intubation, CPR, defibrillation or ACLS   In the event of cardiac or respiratory ARREST Use medication by any route, position, wound care, and other measures to relive pain and suffering. May use oxygen, suction and manual treatment of airway obstruction as needed for comfort.      11/05/16 0317    Code Status History    Date Active Date Inactive Code Status Order ID Comments User Context   06/20/2016  9:06 AM 06/22/2016  1:01 PM DNR [id number]  Demetrios Loll, MD Inpatient   06/17/2016  8:23 PM 06/20/2016  9:06 AM Full Code [id number]  Idelle Crouch, MD Inpatient   06/06/2016 12:18 PM 06/07/2016  5:40 PM Full Code [id number]  Lytle Butte, MD ED   01/13/2016  4:52 PM 01/14/2016  4:12 PM Full Code [id number]  Leonie Green, MD Inpatient   02/09/2015  2:48 PM 02/10/2015  8:32 PM Full Code  [id number]  Dustin Flock, MD Inpatient           Consults  neuro   DVT Prophylaxis  Lovenox    Lab Results  Component Value Date   PLT 179 11/05/2016     Time Spent in minutes  23min  Greater than 50% of time spent in care coordination and counseling patient regarding the condition and plan of care.   Dustin Flock M.D on 11/05/2016 at 12:44 PM  Between 7am to 6pm - Pager - [phone]  After 6pm go to [email or web address] - password [email or web address] [facility]   Office  [phone]

## 2016-11-05 NOTE — Evaluation (Signed)
Clinical/Bedside Swallow Evaluation Patient Details  Name: Erica Levy MRN: [id number] Date of Birth: 1941-07-07  Today's Date: 11/05/2016 Time: SLP Start Time (ACUTE ONLY): 1200 SLP Stop Time (ACUTE ONLY): 1300 SLP Time Calculation (min) (ACUTE ONLY): 60 min  Past Medical History:  Past Medical History:  Diagnosis Date  . Anemia   . Anemia due to chemotherapy   . Anxiety   . Cancer ([place])    breast  . Diabetes mellitus without complication ([place])    diet controlled  . Gallbladder calculus   . Generalized OA   . GERD (gastroesophageal reflux disease)   . Hypercholesteremia   . Hypertension   . Hypothyroid   . Migraine   . Osteoporosis   . Stroke ([place])   . TIA (transient ischemic attack)    July   Past Surgical History:  Past Surgical History:  Procedure Laterality Date  . BREAST BIOPSY Left    bx/clip-neg  . BREAST BIOPSY Left 12/15/2015   path pending/us and stereo bx  . CATARACT EXTRACTION    . DILATION AND CURETTAGE OF UTERUS    . ENDOSCOPIC RETROGRADE CHOLANGIOPANCREATOGRAPHY (ERCP) WITH PROPOFOL N/A 06/19/2016   Procedure: ENDOSCOPIC RETROGRADE CHOLANGIOPANCREATOGRAPHY (ERCP) WITH PROPOFOL;  Surgeon: Lucilla Lame, MD;  Location: [facility] ENDOSCOPY;  Service: Endoscopy;  Laterality: N/A;  . EYE SURGERY Right    Cataract Extraction with IOL  . PARTIAL MASTECTOMY WITH AXILLARY SENTINEL LYMPH NODE BIOPSY Left 01/13/2016   Procedure: PARTIAL MASTECTOMY;  Surgeon: Leonie Green, MD;  Location: [facility] ORS;  Service: General;  Laterality: Left;  . PORTACATH PLACEMENT Right 01/13/2016   Procedure: INSERTION PORT-A-CATH;  Surgeon: Leonie Green, MD;  Location: [facility] ORS;  Service: General;  Laterality: Right;  . RETINAL DETACHMENT SURGERY Right    Dr. Starling Manns, [facility]  . TONSILLECTOMY     HPI:  Pt is a 76 y.o. female w/ multiple medical issues including Breast CA w/ ongoing txs at the [facility],  who presents with Progressively worsening change in mental status.  Patient was just in the ED yesterday for workup, without any clear etiology for her symptoms. She came back today for persistent worsening. Over the past 2-3 days she's had frequent falls, and now she's reached a point where she is responsive but confused and some of her speech, seems somewhat delirious. She has been complaining of headache for the past several weeks, and family states that she had some slurring of her speech 3 days ago. Initial workup in the ED today is largely all within normal limits.   Assessment / Plan / Recommendation Clinical Impression  Pt appears at increased risk for aspiration secondary to Cognitive deficits/altered mental status ongoing at this time. With strict aspiration precautions and feeding support, pt was able to take a few small amounts of thin liquids (VIA PINCHED STRAW) and puree w/ no overt s/s of aspiration noted.  SLP Visit Diagnosis: Dysphagia, oropharyngeal phase (R13.12)    Aspiration Risk  Mild aspiration risk (-Moderate aspiration risk but reduced w/ support)    Diet Recommendation  Full Liquid diet - Thin liquid consistency; strict Aspiration Precautions; feeding support at meals.   Medication Administration: Crushed with puree    Other  Recommendations Recommended Consults:  (Dietician f/u) Oral Care Recommendations: Oral care BID;Staff/trained caregiver to provide oral care   Follow up Recommendations  (TBD)      Frequency and Duration min 3x week  2 weeks       Prognosis Prognosis for Safe Diet Advancement:  Fair Barriers to Reach Goals: Cognitive deficits;Behavior;Severity of deficits      Swallow Study   General Date of Onset: 11/04/16 HPI: Pt is a 76 y.o. female w/ multiple medical issues including Breast CA w/ ongoing txs at the [facility],  who presents with Progressively worsening change in mental status. Patient was just in the ED yesterday for workup, without any clear etiology for her symptoms. She came back today for  persistent worsening. Over the past 2-3 days she's had frequent falls, and now she's reached a point where she is responsive but confused and some of her speech, seems somewhat delirious. She has been complaining of headache for the past several weeks, and family states that she had some slurring of her speech 3 days ago. Initial workup in the ED today is largely all within normal limits. Type of Study: Bedside Swallow Evaluation Previous Swallow Assessment: none Diet Prior to this Study: Regular;Thin liquids Temperature Spikes Noted: No (wbc 9.1) Respiratory Status: Room air History of Recent Intubation: No Behavior/Cognition: Confused;Agitated;Impulsive;Distractible;Requires cueing;Doesn't follow directions (awake; involunatary movements) Oral Cavity Assessment:  (unable to assess d/t cognition) Oral Care Completed by SLP: Recent completion by staff Oral Cavity - Dentition: Adequate natural dentition (per family) Vision:  (n/a) Self-Feeding Abilities: Total assist Patient Positioning: Postural control adequate for testing Baseline Vocal Quality:  (nonverbal) Volitional Cough: Cognitively unable to elicit Volitional Swallow: Unable to elicit    Oral/Motor/Sensory Function Overall Oral Motor/Sensory Function: Within functional limits (w/ bolus management)   Ice Chips Ice chips: Not tested   Thin Liquid Thin Liquid: Impaired Presentation: Straw (fed via Pinched straw to lips) Oral Phase Impairments: Poor awareness of bolus Oral Phase Functional Implications:  (none) Pharyngeal  Phase Impairments:  (none) Other Comments: ~8 trials b/f MD arrived; ultrasound test    Nectar Thick Nectar Thick Liquid: Not tested   Honey Thick Honey Thick Liquid: Not tested   Puree Puree: Impaired Presentation: Spoon (fed; 5 trials(small amounts)) Oral Phase Impairments: Poor awareness of bolus (tactile cues to lips) Oral Phase Functional Implications: Prolonged oral transit (min intermittently) Pharyngeal  Phase Impairments:  (none) Other Comments: required mod-max cues and monitoring for swallowing/clearing orally   Solid   GO   Solid: Not tested    Functional Assessment Tool Used: clinical judgement Functional Limitations: Swallowing Swallow Current Status (W0981): At least 40 percent but less than 60 percent impaired, limited or restricted Swallow Goal Status [phone]): At least 20 percent but less than 40 percent impaired, limited or restricted Swallow Discharge Status [phone]): At least 1 percent but less than 20 percent impaired, limited or restricted     Orinda Kenner, MS, CCC-SLP [place] Fite 11/05/2016,4:00 PM

## 2016-11-05 NOTE — Progress Notes (Signed)
Initial Nutrition Assessment  DOCUMENTATION CODES:   Not applicable  INTERVENTION:  Recommend Ensure Enlive po TID pending diet advancement, each supplement provides 350 kcal and 20 grams of protein. Can decrease as patient's PO intake improves.   NUTRITION DIAGNOSIS:   Inadequate oral intake related to lethargy/confusion as evidenced by per patient/family report.  GOAL:   Patient will meet greater than or equal to 90% of their needs  MONITOR:   PO intake, Supplement acceptance, Diet advancement, Labs, Weight trends, I & O's  REASON FOR ASSESSMENT:   Malnutrition Screening Tool    ASSESSMENT:   76 year old female with PMHx of HTN, hypothyroidism, hypercholesteremia, GERD, h/o TIA and stroke, stage II breast cancer s/p chemotherapy and XRT, DM type 2 presents with altered mental status.   -Per last Oncology note on 3/20 patient had just finished XRT. She finished chemotherapy with Adriamycin, Cytoxan, and Taxol on 07/20/2016. At that visit patient had reported good appetite and denies any weight loss. Had denied N/V, constipation, diarrhea.  Patient very confused at this time and unable to participate in assessment. She appeared to be uncomfortable as she was moving arms and legs constantly during assessment, but per family patient unable to tell them what is making her uncomfortable right now. Patient's husband and son able to provide some limited history. They report the past few days patient hadn't been eating well. Yesterday she only had a pudding and a few bites of mashed potatoes. They are unable to provide many details on intake PTA, but report she was eating well. Usual intake was omelette from [employer] in the morning and then dinner at night. She has Ensure at home but has not been drinking them. Family unsure of UBW or recent weight history. Deny any known complaints of N/V, abdominal pain.   Per chart patient was 150.5 lbs on 1/15 and has lost 8.5 lbs (5.6% body weight)  over 3 months, which is not significant for time frame. Unsure of accuracy of current weight as it is the same weight (to the ounce) entered for the last 3 encounters. RD unable to obtain accurate bed scale weight as patient unable to lie still for weight.   Medications reviewed and include: levothyroxine, prednisone 10 mg daily.  Labs reviewed: CBG 168, Sodium 133, BUN 21.  Nutrition-Focused physical exam completed. Findings are no fat depletion, mild-moderate muscle depletion, and no edema.   Patient does not meet criteria for malnutrition at this time.   Diet Order:  Diet NPO time specified  Skin:  Reviewed, no issues  Last BM:  11/05/2016  Height:   Ht Readings from Last 1 Encounters:  11/04/16 5\' 2"  (1.575 m)    Weight:   Wt Readings from Last 1 Encounters:  11/04/16 142 lb (64.4 kg)    Ideal Body Weight:  50 kg  BMI:  Body mass index is 25.97 kg/m.  Estimated Nutritional Needs:   Kcal:  1430-1655 (MSJ x 1.3-1.5)  Protein:  75-95 grams (1.2-1.5 grams/kg)  Fluid:  1.6 L/day (25 ml/kg)  EDUCATION NEEDS:   No education needs identified at this time  Willey Blade, MS, RD, LDN Pager: [phone] After Hours Pager: [phone]

## 2016-11-05 NOTE — ED Notes (Signed)
Unable to perform NIH scale at this time.  Pt. Will not answer questions nor will patient obey commands.

## 2016-11-05 NOTE — Evaluation (Signed)
Physical Therapy Evaluation Patient Details Name: Erica Levy MRN: [id number] DOB: 28-Feb-1941 Today's Date: 11/05/2016   History of Present Illness  Pt is a 76 y.o. female presenting to hospital with worsening AMS (pt with recent ED visit for AMS), ongoing HA's, and multiple recent falls.  Pt admitted with acute encephalopathy.  CT head negative.  PMH includes L breast CA, stroke, DM, htn, migraine, TIA, and partial L mastectomy.  Clinical Impression  Prior to hospital admission, pt was independent and driving.  Pt lives with her husband in 1 level home with stairs to enter with railings.  Currently pt is requiring 2 assist with bed mobility and transfers.  Pt following occasional cueing with L UE but did not follow any cues with R UE during session (pt moving R UE around on her own and often in non-typical positions).  Pt overall moving B UE's and LE's non-stop when laying in bed (appearing very restless) and also moving extremities around when sitting and standing (but not as much).  Pt did follow some commands (more with functional activities).  Pt would benefit from skilled PT to address noted impairments and functional limitations.  Recommend pt discharge to STR when medically appropriate.    Follow Up Recommendations SNF    Equipment Recommendations  Rolling walker with 5" wheels    Recommendations for Other Services       Precautions / Restrictions Precautions Precautions: Fall Precaution Comments: Port-a-cath Restrictions Weight Bearing Restrictions: No      Mobility  Bed Mobility Overal bed mobility: Needs Assistance Bed Mobility: Supine to Sit;Sit to Supine     Supine to sit: Mod assist;+2 for physical assistance;HOB elevated Sit to supine: Mod assist;+2 for physical assistance;HOB elevated   General bed mobility comments: assist for trunk and B LE's (assist to initiate movement and for safety)  Transfers Overall transfer level: Needs assistance Equipment used:  None Transfers: Sit to/from Stand Sit to Stand: Min assist;Mod assist;+2 physical assistance         General transfer comment: vc's for standing; pt did stand and followed cues to uncross her feet to stand but did not follow commands for hand placement  Ambulation/Gait Ambulation/Gait assistance: Min assist;Mod assist;+2 physical assistance Ambulation Distance (Feet):  (marching in place x5 reps B LE's (not consistent marching and requiring cueing)) Assistive device: 2 person hand held assist   Gait velocity: decreased   General Gait Details: various step height with marching; pt holding head after marching and nodded head when asked if she wanted to lay back down so pt assisted back into supine and pt appearing comfortable after that)  Stairs            Wheelchair Mobility    Modified Rankin (Stroke Patients Only)       Balance Overall balance assessment: Needs assistance Sitting-balance support: Bilateral upper extremity supported;Feet supported;Single extremity supported Sitting balance-Leahy Scale: Fair Sitting balance - Comments: static sitting close SBA (pt with LE's crossed); 1-2 UE support (varying) Postural control: Other (comment) (unsteady in general all directions) Standing balance support: Bilateral upper extremity supported Standing balance-Leahy Scale: Poor Standing balance comment: 2 assist for safety in standing (L UE hand hold assist but pt did not hold onto anything with R UE with max cueing)                             Pertinent Vitals/Pain   Vitals stable throughout treatment session.  Home Living Family/patient expects to be discharged to:: Private residence Living Arrangements: Spouse/significant other Available Help at Discharge: Family Type of Home: Mobile home (Double wide) Home Access: Stairs to enter Entrance Stairs-Rails: Psychiatric nurse of Steps: 6 Home Layout: One level Home Equipment: Walker - 2  wheels      Prior Function Level of Independence: Independent         Comments: (+) driving     Hand Dominance        Extremity/Trunk Assessment   Upper Extremity Assessment Upper Extremity Assessment: Difficult to assess due to impaired cognition    Lower Extremity Assessment Lower Extremity Assessment: Difficult to assess due to impaired cognition    Cervical / Trunk Assessment Cervical / Trunk Assessment: Normal  Communication   Communication: Receptive difficulties;Expressive difficulties  Cognition Arousal/Alertness: Awake/alert Behavior During Therapy: Restless;Flat affect;Impulsive Overall Cognitive Status: Difficult to assess                                        General Comments General comments (skin integrity, edema, etc.): Bruising noted R foot 3rd ,4th, and 5th toes (MD notified).  Nursing cleared pt for participation in physical therapy.  Pt and pt's family (pt's son, pt's husband, and pt's brother) agreeable to PT session.    Exercises  Functional mobility training   Assessment/Plan    PT Assessment Patient needs continued PT services  PT Problem List Decreased strength;Decreased activity tolerance;Decreased balance;Decreased mobility;Decreased cognition;Decreased knowledge of use of DME;Decreased safety awareness;Decreased knowledge of precautions;Pain       PT Treatment Interventions DME instruction;Gait training;Stair training;Functional mobility training;Therapeutic activities;Therapeutic exercise;Balance training;Neuromuscular re-education;Patient/family education    PT Goals (Current goals can be found in the Care Plan section)  Acute Rehab PT Goals Patient Stated Goal: to return to PLOF PT Goal Formulation: With family Time For Goal Achievement: 11/19/16 Potential to Achieve Goals: Fair    Frequency Min 2X/week   Barriers to discharge Decreased caregiver support      Co-evaluation               End of  Session Equipment Utilized During Treatment: Gait belt Activity Tolerance: Patient limited by pain (pt holding head in standing but no apparent pain once laying in bed) Patient left: in bed;with call bell/phone within reach;with bed alarm set;with family/visitor present Nurse Communication: Mobility status;Precautions (Need for changing) PT Visit Diagnosis: Unsteadiness on feet (R26.81);Other abnormalities of gait and mobility (R26.89);Repeated falls (R29.6);History of falling (Z91.81);Muscle weakness (generalized) (M62.81)    Time: 1036-1100 PT Time Calculation (min) (ACUTE ONLY): 24 min   Charges:   PT Evaluation $PT Eval Low Complexity: 1 Procedure PT Treatments $Therapeutic Activity: 8-22 mins   PT G CodesLeitha Bleak, PT 11/05/16, 3:05 PM [phone]

## 2016-11-05 NOTE — H&P (Signed)
[place] [facility] [place] NAME: Erica Levy    MR#:  [id number]  DATE OF BIRTH:  [birth year]  DATE OF ADMISSION:  11/04/2016  PRIMARY CARE PHYSICIAN: [name], [name], NP   REQUESTING/REFERRING PHYSICIAN: Jacqualine Code, MD  CHIEF COMPLAINT:   Chief Complaint  Patient presents with  . Altered Mental Status    HISTORY OF PRESENT ILLNESS:  Erica Levy  is a 76 y.o. female who presents with Progressively worsening change in mental status. Patient was just in the ED yesterday for workup, without any clear etiology for her symptoms. She came back today for persistent worsening. Over the past 2-3 days she's had frequent falls, and now she's reached a point where she is responsive but confused and some of her speech, seems somewhat delirious. She has been complaining of headache for the past several weeks, and family states that she had some slurring of her speech 3 days ago. Initial workup in the ED today is largely all within normal limits. Hospitalists were called for admission and further evaluation  PAST MEDICAL HISTORY:   Past Medical History:  Diagnosis Date  . Anemia   . Anemia due to chemotherapy   . Anxiety   . Cancer ([place])    breast  . Diabetes mellitus without complication ([place])    diet controlled  . Gallbladder calculus   . Generalized OA   . GERD (gastroesophageal reflux disease)   . Hypercholesteremia   . Hypertension   . Hypothyroid   . Migraine   . Osteoporosis   . Stroke ([place])   . TIA (transient ischemic attack)    July    PAST SURGICAL HISTORY:   Past Surgical History:  Procedure Laterality Date  . BREAST BIOPSY Left    bx/clip-neg  . BREAST BIOPSY Left 12/15/2015   path pending/us and stereo bx  . CATARACT EXTRACTION    . DILATION AND CURETTAGE OF UTERUS    . ENDOSCOPIC RETROGRADE CHOLANGIOPANCREATOGRAPHY (ERCP) WITH PROPOFOL N/A 06/19/2016   Procedure: ENDOSCOPIC RETROGRADE CHOLANGIOPANCREATOGRAPHY  (ERCP) WITH PROPOFOL;  Surgeon: Lucilla Lame, MD;  Location: [facility] ENDOSCOPY;  Service: Endoscopy;  Laterality: N/A;  . EYE SURGERY Right    Cataract Extraction with IOL  . PARTIAL MASTECTOMY WITH AXILLARY SENTINEL LYMPH NODE BIOPSY Left 01/13/2016   Procedure: PARTIAL MASTECTOMY;  Surgeon: Leonie Green, MD;  Location: [facility] ORS;  Service: General;  Laterality: Left;  . PORTACATH PLACEMENT Right 01/13/2016   Procedure: INSERTION PORT-A-CATH;  Surgeon: Leonie Green, MD;  Location: [facility] ORS;  Service: General;  Laterality: Right;  . RETINAL DETACHMENT SURGERY Right    Dr. Starling Manns, [facility]  . TONSILLECTOMY      SOCIAL HISTORY:   Social History  Substance Use Topics  . Smoking status: Never Smoker  . Smokeless tobacco: Never Used  . Alcohol use No    FAMILY HISTORY:   Family History  Problem Relation Age of Onset  . Hypertension Mother   . Osteoarthritis Mother   . Dementia Father   . Breast cancer Cousin 6    mat cousin    DRUG ALLERGIES:  No Known Allergies  MEDICATIONS AT HOME:   Prior to Admission medications   Medication Sig Start Date End Date Taking? Authorizing Provider  aspirin-acetaminophen-caffeine (EXCEDRIN MIGRAINE) [phone] MG tablet Take 2 tablets by mouth every 4 (four) hours as needed for headache.    Historical Provider, MD  atorvastatin (LIPITOR) 80 MG tablet Take 80 mg by mouth daily at  6 PM.     Historical Provider, MD  Cholecalciferol (VITAMIN D3) 5000 units CAPS Take 1 capsule by mouth every other day.    Historical Provider, MD  Diphenhydramine-APAP (PERCOGESIC EXTRA STRENGTH) 12.5-500 MG TABS Take 2 tablets by mouth every 6 (six) hours as needed (headache, pain).    Historical Provider, MD  fluticasone (FLONASE) 50 MCG/ACT nasal spray Place 2 sprays into both nostrils 2 (two) times daily as needed for allergies or rhinitis.    Historical Provider, MD  hydrochlorothiazide (MICROZIDE) 12.5 MG capsule Take 12.5 mg by mouth daily.     Historical Provider, MD  HYDROcodone-acetaminophen (NORCO/VICODIN) 5-325 MG tablet 1-2 tabs po qd prn for pain/headache Patient taking differently: Take 1 tablet by mouth every 4 (four) hours as needed. pain/headache 10/06/16   Norval Gable, MD  ibuprofen (ADVIL,MOTRIN) 200 MG tablet Take 400 mg by mouth every 4 (four) hours as needed for mild pain.    Historical Provider, MD  levothyroxine (SYNTHROID, LEVOTHROID) 50 MCG tablet Take 1 tablet by mouth daily. 01/08/16   Historical Provider, MD  nortriptyline (PAMELOR) 10 MG capsule Take 10 mg by mouth at bedtime. 10/29/16   Historical Provider, MD  ondansetron (ZOFRAN ODT) 4 MG disintegrating tablet Take 1 tablet (4 mg total) by mouth every 8 (eight) hours as needed for nausea or vomiting. Patient not taking: Reported on 10/30/2016 06/22/16   Gladstone Lighter, MD  ondansetron (ZOFRAN) 8 MG tablet Take 8 mg by mouth every 8 (eight) hours as needed for nausea or vomiting.  10/15/16   Historical Provider, MD  predniSONE (DELTASONE) 10 MG tablet Take 10 mg by mouth daily with breakfast. Should be completed by 11-01-2016 10/23/16   Historical Provider, MD    REVIEW OF SYSTEMS:  Review of Systems  Unable to perform ROS: Acuity of condition     VITAL SIGNS:   Vitals:   11/04/16 2230 11/04/16 2300 11/04/16 2330 11/05/16 0010  BP: (!) 166/76 (!) 151/70 (!) 158/68 (!) 155/70  Pulse: 64 68 76 87  Resp:    20  Temp:      TempSrc:      SpO2: 98% 98% 99% 100%  Weight:      Height:       Wt Readings from Last 3 Encounters:  11/04/16 64.4 kg (142 lb)  11/03/16 64.4 kg (142 lb)  11/02/16 64.4 kg (142 lb)    PHYSICAL EXAMINATION:  Physical Exam  Vitals reviewed. Constitutional: She appears well-developed and well-nourished. No distress.  HENT:  Head: Normocephalic and atraumatic.  Mouth/Throat: Oropharynx is clear and moist.  Eyes: Conjunctivae and EOM are normal. Pupils are equal, round, and reactive to light. No scleral icterus.  Neck: Normal  range of motion. Neck supple. No JVD present. No thyromegaly present.  Cardiovascular: Normal rate, regular rhythm and intact distal pulses.  Exam reveals no gallop and no friction rub.   No murmur heard. Respiratory: Effort normal and breath sounds normal. No respiratory distress. She has no wheezes. She has no rales.  GI: Soft. Bowel sounds are normal. She exhibits no distension. There is no tenderness.  Musculoskeletal: Normal range of motion. She exhibits no edema.  No arthritis, no gout  Lymphadenopathy:    She has no cervical adenopathy.  Neurological: She is alert. No cranial nerve deficit.  Unable to fully assess due to patient condition  Skin: Skin is warm and dry. No rash noted. No erythema.  Psychiatric:  Unable to assess due to patient condition  LABORATORY PANEL:   CBC  Recent Labs Lab 11/04/16 2153  WBC 8.4  HGB 13.3  HCT 37.2  PLT 177   ------------------------------------------------------------------------------------------------------------------  Chemistries   Recent Labs Lab 11/04/16 2153  NA 133*  K 3.5  CL 102  CO2 23  GLUCOSE 186*  BUN 26*  CREATININE 0.71  CALCIUM 8.9  AST 29  ALT 34  ALKPHOS 71  BILITOT 1.5*   ------------------------------------------------------------------------------------------------------------------  Cardiac Enzymes  Recent Labs Lab 11/04/16 2153  TROPONINI <0.03   ------------------------------------------------------------------------------------------------------------------  RADIOLOGY:  Dg Chest 1 View  Result Date: 11/03/2016 CLINICAL DATA:  Fall this morning. EXAM: CHEST 1 VIEW COMPARISON:  06/06/2016 FINDINGS: Right IJ Port-A-Cath unchanged. Lungs are adequately inflated without consolidation, effusion or pneumothorax. Cardiomediastinal silhouette is normal. Calcified plaque over the thoracic aorta. Mild degenerate change of the spine. IMPRESSION: No active disease. Electronically Signed   By:  Marin Olp M.D.   On: 11/03/2016 21:13   Dg Pelvis 1-2 Views  Result Date: 11/03/2016 CLINICAL DATA:  Fall this morning.  Right hip pain. EXAM: PELVIS - 1-2 VIEW COMPARISON:  11/10/2015 and 06/28/2009 FINDINGS: There is no evidence of pelvic fracture or diastasis. No pelvic bone lesions are seen. Degenerative change of the spine. IMPRESSION: No acute findings. Electronically Signed   By: Marin Olp M.D.   On: 11/03/2016 21:16   Ct Head Wo Contrast  Result Date: 11/04/2016 CLINICAL DATA:  76 y/o F; increased confusion today. Fall yesterday. History of breast cancer. EXAM: CT HEAD WITHOUT CONTRAST TECHNIQUE: Contiguous axial images were obtained from the base of the skull through the vertex without intravenous contrast. COMPARISON:  11/03/2016 CT of the head. FINDINGS: Brain: No evidence of acute infarction, hemorrhage, hydrocephalus, extra-axial collection or mass lesion/mass effect. Stable mild parenchymal volume loss. Vascular: Extensive calcific atherosclerosis of the cavernous and paraclinoid internal carotid arteries. Skull: Normal. Negative for fracture or focal lesion. Sinuses/Orbits: No acute finding. Other: Right intra-ocular lens replacement. IMPRESSION: 1. No acute intracranial abnormality identified. 2. Stable mild parenchymal volume loss. Electronically Signed   By: Kristine Garbe M.D.   On: 11/04/2016 22:30   Ct Head Wo Contrast  Result Date: 11/03/2016 CLINICAL DATA:  Fall this morning with generalized weakness 1 week. Right-sided headache. Breast cancer. EXAM: CT HEAD WITHOUT CONTRAST TECHNIQUE: Contiguous axial images were obtained from the base of the skull through the vertex without intravenous contrast. COMPARISON:  10/16/2016 and 02/09/2015 FINDINGS: Brain: Ventricles, cisterns and other CSF spaces are within normal. No mass, mass effect, shift of midline structures or acute hemorrhage. No evidence of acute infarction. Minimal chronic ischemic microvascular disease.  Vascular: Calcified atherosclerotic plaque over the cavernous segment of the internal carotid arteries. Skull: Within normal. Sinuses/Orbits: Within normal. Other: None. IMPRESSION: No acute intracranial findings. Mild chronic ischemic microvascular disease. Electronically Signed   By: Marin Olp M.D.   On: 11/03/2016 21:43   Dg Chest Port 1 View  Result Date: 11/04/2016 CLINICAL DATA:  Weakness and altered mental status. EXAM: PORTABLE CHEST 1 VIEW COMPARISON:  11/03/2016 FINDINGS: Right IJ Port-A-Cath unchanged. Lungs are adequately inflated without airspace consolidation or effusion. Cardiomediastinal silhouette is within normal. There is calcified plaque over the thoracic aorta. There are mild degenerative changes of the spine. IMPRESSION: No acute cardiopulmonary disease. Aortic atherosclerosis. Electronically Signed   By: Marin Olp M.D.   On: 11/04/2016 22:39    EKG:   Orders placed or performed during the hospital encounter of 11/04/16  . ED EKG  . ED EKG  IMPRESSION AND PLAN:  Principal Problem:   Acute encephalopathy - unclear etiology upfront, initial infectious workup is negative, other workup initially the ED is also negative. We will admit her and order MRI scan and neurology consult. Some concern for possibly a stroke, though her presentation be not entirely typical for this. Also some concern for possible relation to her cancer or cancer treatment. We will get an oncology consult as well. Active Problems:   HTN (hypertension) - continue home meds   Diabetes ([place]) - sliding scale insulin with corresponding glucose checks   Hypothyroidism - home dose thyroid replacement  All the records are reviewed and case discussed with ED provider. Management plans discussed with the patient and/or family.  DVT PROPHYLAXIS: SubQ lovenox  GI PROPHYLAXIS: None  ADMISSION STATUS: Inpatient  CODE STATUS: DNR Code Status History    Date Active Date Inactive Code Status Order ID  Comments User Context   06/20/2016  9:06 AM 06/22/2016  1:01 PM DNR [id number]  Demetrios Loll, MD Inpatient   06/17/2016  8:23 PM 06/20/2016  9:06 AM Full Code [id number]  Idelle Crouch, MD Inpatient   06/06/2016 12:18 PM 06/07/2016  5:40 PM Full Code [id number]  Lytle Butte, MD ED   01/13/2016  4:52 PM 01/14/2016  4:12 PM Full Code [id number]  Leonie Green, MD Inpatient   02/09/2015  2:48 PM 02/10/2015  8:32 PM Full Code [id number]  Dustin Flock, MD Inpatient    Questions for Most Recent Historical Code Status (Order [id number])    Question Answer Comment   In the event of cardiac or respiratory ARREST Do not call a "code blue"    In the event of cardiac or respiratory ARREST Do not perform Intubation, CPR, defibrillation or ACLS    In the event of cardiac or respiratory ARREST Use medication by any route, position, wound care, and other measures to relive pain and suffering. May use oxygen, suction and manual treatment of airway obstruction as needed for comfort.       TOTAL TIME TAKING CARE OF THIS PATIENT: 45 minutes.   Rajah Tagliaferro [place] 11/05/2016, 12:37 AM  [facility]  Office  [phone]  CC: Primary care physician; [name], [name], NP  Note:  This document was prepared using Dragon voice recognition software and may include unintentional dictation errors.

## 2016-11-05 NOTE — Consult Note (Signed)
Reason for Consult:confusion  Referring Physician: Dr. Posey Pronto   CC: confusion   HPI: Erica Levy is an 76 y.o. female female with past medical history significant for hypertension, CVA, breast cancer diagnosed in May 2017 on chemotherapy multiple recent admissions for headaches and recently seen for disorientation.  Pt was seen out pt neurology office and was started on elavil which patient stopped as she felt "drunk".  Pt lives at home and usually able to drive and fully take care of herself.    Past Medical History:  Diagnosis Date  . Anemia   . Anemia due to chemotherapy   . Anxiety   . Cancer ([place])    breast  . Diabetes mellitus without complication ([place])    diet controlled  . Gallbladder calculus   . Generalized OA   . GERD (gastroesophageal reflux disease)   . Hypercholesteremia   . Hypertension   . Hypothyroid   . Migraine   . Osteoporosis   . Stroke ([place])   . TIA (transient ischemic attack)    July    Past Surgical History:  Procedure Laterality Date  . BREAST BIOPSY Left    bx/clip-neg  . BREAST BIOPSY Left 12/15/2015   path pending/us and stereo bx  . CATARACT EXTRACTION    . DILATION AND CURETTAGE OF UTERUS    . ENDOSCOPIC RETROGRADE CHOLANGIOPANCREATOGRAPHY (ERCP) WITH PROPOFOL N/A 06/19/2016   Procedure: ENDOSCOPIC RETROGRADE CHOLANGIOPANCREATOGRAPHY (ERCP) WITH PROPOFOL;  Surgeon: Lucilla Lame, MD;  Location: [facility] ENDOSCOPY;  Service: Endoscopy;  Laterality: N/A;  . EYE SURGERY Right    Cataract Extraction with IOL  . PARTIAL MASTECTOMY WITH AXILLARY SENTINEL LYMPH NODE BIOPSY Left 01/13/2016   Procedure: PARTIAL MASTECTOMY;  Surgeon: Leonie Green, MD;  Location: [facility] ORS;  Service: General;  Laterality: Left;  . PORTACATH PLACEMENT Right 01/13/2016   Procedure: INSERTION PORT-A-CATH;  Surgeon: Leonie Green, MD;  Location: [facility] ORS;  Service: General;  Laterality: Right;  . RETINAL DETACHMENT SURGERY Right    Dr. Starling Manns, [facility]  .  TONSILLECTOMY      Family History  Problem Relation Age of Onset  . Hypertension Mother   . Osteoarthritis Mother   . Dementia Father   . Breast cancer Cousin 70    mat cousin    Social History:  reports that she has never smoked. She has never used smokeless tobacco. She reports that she does not drink alcohol or use drugs.  No Known Allergies  Medications: I have reviewed the patient's current medications.  ROS: Unable to obtain as patient can't provide any history   Physical Examination: Blood pressure (!) 132/56, pulse 95, temperature 98.6 F (37 C), temperature source Oral, resp. rate 18, height 5\' 2"  (1.575 m), weight 64.4 kg (142 lb), SpO2 99 %.    Neurological Examination   Mental Status: Alert to name only Cranial Nerves: II: Discs flat bilaterally; Visual fields grossly normal, pupils equal, round, reactive to light and accommodation III,IV, VI: ptosis not present, extra-ocular motions intact bilaterally V,VII: smile symmetric, facial light touch sensation normal bilaterally VIII: hearing normal bilaterally IX,X: gag reflex present XII: midline tongue extension Motor: Moves all extremities symmetrically Tone and bulk:normal tone throughout; no atrophy noted Sensory: unable to access  Deep Tendon Reflexes: 1+ and symmetric throughout Plantars: Right: downgoing   Left: downgoing Cerebellar: Not tested Gait: not tested       Laboratory Studies:   Basic Metabolic Panel:  Recent Labs Lab 11/03/16 2111 11/04/16 2153 11/05/16 0507  NA 134* 133* 133*  K 3.1* 3.5 3.5  CL 99* 102 101  CO2 26 23 25   GLUCOSE 154* 186* 144*  BUN 31* 26* 21*  CREATININE 0.87 0.71 0.74  CALCIUM 9.6 8.9 8.9    Liver Function Tests:  Recent Labs Lab 11/03/16 2111 11/04/16 2153  AST 35 29  ALT 35 34  ALKPHOS 74 71  BILITOT 1.8* 1.5*  PROT 7.0 6.5  ALBUMIN 4.4 3.9    Recent Labs Lab 11/03/16 2111 11/04/16 2153  LIPASE <10* 14    Recent Labs Lab  11/04/16 2155  AMMONIA 20    CBC:  Recent Labs Lab 11/03/16 2111 11/04/16 2153 11/05/16 0507  WBC 5.8 8.4 9.1  NEUTROABS 4.7 7.1*  --   HGB 14.1 13.3 13.2  HCT 38.9 37.2 36.6  MCV 87.0 87.8 87.7  PLT 197 177 179    Cardiac Enzymes:  Recent Labs Lab 11/03/16 2111 11/04/16 2153  TROPONINI <0.03 <0.03    BNP: Invalid input(s): POCBNP  CBG:  Recent Labs Lab 11/04/16 2229  GLUCAP 168*    Microbiology: No results found for this or any previous visit.  Coagulation Studies: No results for input(s): LABPROT, INR in the last 72 hours.  Urinalysis:  Recent Labs Lab 11/04/16 0313 11/04/16 2140  COLORURINE YELLOW* YELLOW*  LABSPEC 1.021 1.020  PHURINE 5.0 5.0  GLUCOSEU NEGATIVE 50*  HGBUR NEGATIVE SMALL*  BILIRUBINUR NEGATIVE NEGATIVE  KETONESUR 5* 5*  PROTEINUR NEGATIVE NEGATIVE  NITRITE NEGATIVE NEGATIVE  LEUKOCYTESUR NEGATIVE NEGATIVE    Lipid Panel:     Component Value Date/Time   CHOL 142 11/05/2016 0507   TRIG 74 11/05/2016 0507   HDL 52 11/05/2016 0507   CHOLHDL 2.7 11/05/2016 0507   VLDL 15 11/05/2016 0507   LDLCALC 75 11/05/2016 0507    HgbA1C:  Lab Results  Component Value Date   HGBA1C 5.4 02/09/2015    Urine Drug Screen:  No results found for: LABOPIA, COCAINSCRNUR, LABBENZ, AMPHETMU, THCU, LABBARB  Alcohol Level: No results for input(s): ETH in the last 168 hours.  Other results: EKG: normal EKG, normal sinus rhythm, unchanged from previous tracings.  Imaging: Dg Chest 1 View  Result Date: 11/03/2016 CLINICAL DATA:  Fall this morning. EXAM: CHEST 1 VIEW COMPARISON:  06/06/2016 FINDINGS: Right IJ Port-A-Cath unchanged. Lungs are adequately inflated without consolidation, effusion or pneumothorax. Cardiomediastinal silhouette is normal. Calcified plaque over the thoracic aorta. Mild degenerate change of the spine. IMPRESSION: No active disease. Electronically Signed   By: Marin Olp M.D.   On: 11/03/2016 21:13   Dg Pelvis 1-2  Views  Result Date: 11/03/2016 CLINICAL DATA:  Fall this morning.  Right hip pain. EXAM: PELVIS - 1-2 VIEW COMPARISON:  11/10/2015 and 06/28/2009 FINDINGS: There is no evidence of pelvic fracture or diastasis. No pelvic bone lesions are seen. Degenerative change of the spine. IMPRESSION: No acute findings. Electronically Signed   By: Marin Olp M.D.   On: 11/03/2016 21:16   Ct Head Wo Contrast  Result Date: 11/04/2016 CLINICAL DATA:  76 y/o F; increased confusion today. Fall yesterday. History of breast cancer. EXAM: CT HEAD WITHOUT CONTRAST TECHNIQUE: Contiguous axial images were obtained from the base of the skull through the vertex without intravenous contrast. COMPARISON:  11/03/2016 CT of the head. FINDINGS: Brain: No evidence of acute infarction, hemorrhage, hydrocephalus, extra-axial collection or mass lesion/mass effect. Stable mild parenchymal volume loss. Vascular: Extensive calcific atherosclerosis of the cavernous and paraclinoid internal carotid arteries. Skull: Normal. Negative for  fracture or focal lesion. Sinuses/Orbits: No acute finding. Other: Right intra-ocular lens replacement. IMPRESSION: 1. No acute intracranial abnormality identified. 2. Stable mild parenchymal volume loss. Electronically Signed   By: Kristine Garbe M.D.   On: 11/04/2016 22:30   Ct Head Wo Contrast  Result Date: 11/03/2016 CLINICAL DATA:  Fall this morning with generalized weakness 1 week. Right-sided headache. Breast cancer. EXAM: CT HEAD WITHOUT CONTRAST TECHNIQUE: Contiguous axial images were obtained from the base of the skull through the vertex without intravenous contrast. COMPARISON:  10/16/2016 and 02/09/2015 FINDINGS: Brain: Ventricles, cisterns and other CSF spaces are within normal. No mass, mass effect, shift of midline structures or acute hemorrhage. No evidence of acute infarction. Minimal chronic ischemic microvascular disease. Vascular: Calcified atherosclerotic plaque over the cavernous  segment of the internal carotid arteries. Skull: Within normal. Sinuses/Orbits: Within normal. Other: None. IMPRESSION: No acute intracranial findings. Mild chronic ischemic microvascular disease. Electronically Signed   By: Marin Olp M.D.   On: 11/03/2016 21:43   US Carotid Bilateral (at [facility] And [facility] Only)  Result Date: 11/05/2016 CLINICAL DATA:  Weakness EXAM: BILATERAL CAROTID DUPLEX ULTRASOUND TECHNIQUE: Pearline Cables scale imaging, color Doppler and duplex ultrasound were performed of bilateral carotid and vertebral arteries in the neck. COMPARISON:  None. FINDINGS: Criteria: Quantification of carotid stenosis is based on velocity parameters that correlate the residual internal carotid diameter with NASCET-based stenosis levels, using the diameter of the distal internal carotid lumen as the denominator for stenosis measurement. The following velocity measurements were obtained: RIGHT ICA:  91 cm/sec CCA:  735 cm/sec SYSTOLIC ICA/CCA RATIO:  0.9 DIASTOLIC ICA/CCA RATIO:  2.1 ECA:  94 cm/sec LEFT ICA:  110 cm/sec CCA:  85 cm/sec SYSTOLIC ICA/CCA RATIO:  1.3 DIASTOLIC ICA/CCA RATIO:  1.9 ECA:  133 cm/sec RIGHT CAROTID ARTERY: Little if any plaque in the bulb. Low resistance internal carotid Doppler pattern. RIGHT VERTEBRAL ARTERY:  Antegrade. LEFT CAROTID ARTERY: Little if any plaque in the bulb. Low resistance internal carotid Doppler pattern. LEFT VERTEBRAL ARTERY:  Antegrade. IMPRESSION: Less than 50% stenosis in the right and left internal carotid arteries. Electronically Signed   By: Marybelle Killings M.D.   On: 11/05/2016 11:00   Dg Chest Port 1 View  Result Date: 11/04/2016 CLINICAL DATA:  Weakness and altered mental status. EXAM: PORTABLE CHEST 1 VIEW COMPARISON:  11/03/2016 FINDINGS: Right IJ Port-A-Cath unchanged. Lungs are adequately inflated without airspace consolidation or effusion. Cardiomediastinal silhouette is within normal. There is calcified plaque over the thoracic aorta. There are mild  degenerative changes of the spine. IMPRESSION: No acute cardiopulmonary disease. Aortic atherosclerosis. Electronically Signed   By: Marin Olp M.D.   On: 11/04/2016 22:39     Assessment/Plan: 76 y.o. female female with past medical history significant for hypertension, CVA, breast cancer diagnosed in May 2017 on chemotherapy multiple recent admissions for headaches and recently seen for disorientation.  Pt was seen out pt neurology office and was started on elavil which patient stopped as she felt "drunk".  Pt lives at home and usually able to drive and fully take care of herself.   - unclear etiology of confusion, ? Medication withdrawal or starting elavil - ammonia level is normal range, no signs of infection  - not following commands as baseline completely intact and drives. Information obtain from pt's son - [place] no acute abnormalities   Plan: - hydration  - chronic headaches.  I saw my notes from 2017 and pt had migraines then.  Now with hx of  cancer pt is hypercoagulable.  - Would like to obtain MR venogram, but do not think pt will stay still so CTV ordered to look for sinus thrombosis - EEG ordered  Lacee Grey   11/05/2016, 12:11 PM

## 2016-11-05 NOTE — Progress Notes (Signed)
*  PRELIMINARY RESULTS* Echocardiogram 2D Echocardiogram has been performed.  Sherrie Sport 11/05/2016, 1:55 PM

## 2016-11-05 NOTE — Consult Note (Signed)
[place]  [phone] [phone]  ID: Erica Levy OB: [birth year]  MR#: [id number]  PXT#:[id number]  Patient Care Team: Ricardo Jericho, NP as PCP - General (Family Medicine)  CHIEF COMPLAINT: Acute encephalopathy of unknown etiology possibly secondary to metastatic breast cancer.  INTERVAL HISTORY: Patient is a 76 year old female who is admitted with worsening confusion and falls. Patient's family is at the bedside and states she is in her normal state of health last week until he noted some increasing confusion and falls. There is no other neurologic complaints or focal weakness. Family does not report fevers. There is no chest pain or shortness of breath. She has a fair appetite and no reported weight loss. She has no nausea, vomiting, consultation, or diarrhea. No further specific complaints have been reported.  REVIEW OF SYSTEMS:   Review of Systems  Unable to perform ROS: Medical condition   PAST MEDICAL HISTORY: Past Medical History:  Diagnosis Date  . Anemia   . Anemia due to chemotherapy   . Anxiety   . Cancer ([place])    breast  . Diabetes mellitus without complication ([place])    diet controlled  . Gallbladder calculus   . Generalized OA   . GERD (gastroesophageal reflux disease)   . Hypercholesteremia   . Hypertension   . Hypothyroid   . Migraine   . Osteoporosis   . Stroke ([place])   . TIA (transient ischemic attack)    July    PAST SURGICAL HISTORY: Past Surgical History:  Procedure Laterality Date  . BREAST BIOPSY Left    bx/clip-neg  . BREAST BIOPSY Left 12/15/2015   path pending/us and stereo bx  . CATARACT EXTRACTION    . DILATION AND CURETTAGE OF UTERUS    . ENDOSCOPIC RETROGRADE CHOLANGIOPANCREATOGRAPHY (ERCP) WITH PROPOFOL N/A 06/19/2016   Procedure: ENDOSCOPIC RETROGRADE CHOLANGIOPANCREATOGRAPHY (ERCP) WITH PROPOFOL;  Surgeon: Lucilla Lame, MD;  Location: [facility] ENDOSCOPY;  Service: Endoscopy;  Laterality:  N/A;  . EYE SURGERY Right    Cataract Extraction with IOL  . PARTIAL MASTECTOMY WITH AXILLARY SENTINEL LYMPH NODE BIOPSY Left 01/13/2016   Procedure: PARTIAL MASTECTOMY;  Surgeon: Leonie Green, MD;  Location: [facility] ORS;  Service: General;  Laterality: Left;  . PORTACATH PLACEMENT Right 01/13/2016   Procedure: INSERTION PORT-A-CATH;  Surgeon: Leonie Green, MD;  Location: [facility] ORS;  Service: General;  Laterality: Right;  . RETINAL DETACHMENT SURGERY Right    Dr. Starling Manns, [facility]  . TONSILLECTOMY      FAMILY HISTORY: Family History  Problem Relation Age of Onset  . Hypertension Mother   . Osteoarthritis Mother   . Dementia Father   . Breast cancer Cousin 81    mat cousin    ADVANCED DIRECTIVES (Y/N):  @ADVDIR @  HEALTH MAINTENANCE: Social History  Substance Use Topics  . Smoking status: Never Smoker  . Smokeless tobacco: Never Used  . Alcohol use No     Colonoscopy:  PAP:  Bone density:  Lipid panel:  No Known Allergies  Current Facility-Administered Medications  Medication Dose Route Frequency Provider Last Rate Last Dose  . acetaminophen (TYLENOL) tablet 650 mg  650 mg Oral Q6H PRN Lance Coon, MD       Or  . acetaminophen (TYLENOL) suppository 650 mg  650 mg Rectal Q6H PRN Lance Coon, MD      . aspirin suppository 300 mg  300 mg Rectal Daily Harrie Foreman, MD   300 mg at 11/05/16 1221   Or  .  aspirin tablet 325 mg  325 mg Oral Daily Harrie Foreman, MD      . atorvastatin (LIPITOR) tablet 80 mg  80 mg Oral q1800 Lance Coon, MD      . chlorhexidine (PERIDEX) 0.12 % solution 15 mL  15 mL Mouth Rinse BID Dustin Flock, MD   15 mL at 11/05/16 2139  . enoxaparin (LOVENOX) injection 40 mg  40 mg Subcutaneous Q24H Lance Coon, MD   40 mg at 11/05/16 2139  . levothyroxine (SYNTHROID, LEVOTHROID) tablet 50 mcg  50 mcg Oral QAC breakfast Lance Coon, MD      . MEDLINE mouth rinse  15 mL Mouth Rinse q12n4p Dustin Flock, MD   15 mL at 11/05/16 1854    . ondansetron (ZOFRAN) tablet 4 mg  4 mg Oral Q6H PRN Lance Coon, MD       Or  . ondansetron [facility]) injection 4 mg  4 mg Intravenous Q6H PRN Lance Coon, MD      . sodium chloride flush (NS) 0.9 % injection 3 mL  3 mL Intravenous Q12H Lance Coon, MD   3 mL at 11/05/16 2145    OBJECTIVE: Vitals:   11/05/16 1604 11/05/16 1958  BP: (!) 110/52 (!) 130/48  Pulse: 68 71  Resp: 16 20  Temp:  97.8 F (36.6 C)     Body mass index is 25.97 kg/m.    ECOG FS:4 - Bedbound  General: Well-developed, well-nourished, no acute distress. Eyes: Pink conjunctiva, anicteric sclera. HEENT: Normocephalic, moist mucous membranes, clear oropharnyx. Lungs: Clear to auscultation bilaterally. Heart: Regular rate and rhythm. No rubs, murmurs, or gallops. Abdomen: Soft, nontender, nondistended. No organomegaly noted, normoactive bowel sounds. Musculoskeletal: No edema, cyanosis, or clubbing. Neuro: Confused. Skin: No rashes or petechiae noted.   LAB RESULTS:  Lab Results  Component Value Date   NA 133 (L) 11/05/2016   K 3.5 11/05/2016   CL 101 11/05/2016   CO2 25 11/05/2016   GLUCOSE 144 (H) 11/05/2016   BUN 21 (H) 11/05/2016   CREATININE 0.74 11/05/2016   CALCIUM 8.9 11/05/2016   PROT 6.5 11/04/2016   ALBUMIN 3.9 11/04/2016   AST 29 11/04/2016   ALT 34 11/04/2016   ALKPHOS 71 11/04/2016   BILITOT 1.5 (H) 11/04/2016   GFRNONAA >60 11/05/2016   GFRAA >60 11/05/2016    Lab Results  Component Value Date   WBC 9.1 11/05/2016   NEUTROABS 7.1 (H) 11/04/2016   HGB 13.2 11/05/2016   HCT 36.6 11/05/2016   MCV 87.7 11/05/2016   PLT 179 11/05/2016     STUDIES: Dg Chest 1 View  Result Date: 11/03/2016 CLINICAL DATA:  Fall this morning. EXAM: CHEST 1 VIEW COMPARISON:  06/06/2016 FINDINGS: Right IJ Port-A-Cath unchanged. Lungs are adequately inflated without consolidation, effusion or pneumothorax. Cardiomediastinal silhouette is normal. Calcified plaque over the thoracic aorta. Mild  degenerate change of the spine. IMPRESSION: No active disease. Electronically Signed   By: Marin Olp M.D.   On: 11/03/2016 21:13   Dg Pelvis 1-2 Views  Result Date: 11/03/2016 CLINICAL DATA:  Fall this morning.  Right hip pain. EXAM: PELVIS - 1-2 VIEW COMPARISON:  11/10/2015 and 06/28/2009 FINDINGS: There is no evidence of pelvic fracture or diastasis. No pelvic bone lesions are seen. Degenerative change of the spine. IMPRESSION: No acute findings. Electronically Signed   By: Marin Olp M.D.   On: 11/03/2016 21:16   Ct Head Wo Contrast  Result Date: 11/04/2016 CLINICAL DATA:  76 y/o F; increased confusion  today. Fall yesterday. History of breast cancer. EXAM: CT HEAD WITHOUT CONTRAST TECHNIQUE: Contiguous axial images were obtained from the base of the skull through the vertex without intravenous contrast. COMPARISON:  11/03/2016 CT of the head. FINDINGS: Brain: No evidence of acute infarction, hemorrhage, hydrocephalus, extra-axial collection or mass lesion/mass effect. Stable mild parenchymal volume loss. Vascular: Extensive calcific atherosclerosis of the cavernous and paraclinoid internal carotid arteries. Skull: Normal. Negative for fracture or focal lesion. Sinuses/Orbits: No acute finding. Other: Right intra-ocular lens replacement. IMPRESSION: 1. No acute intracranial abnormality identified. 2. Stable mild parenchymal volume loss. Electronically Signed   By: Kristine Garbe M.D.   On: 11/04/2016 22:30   Ct Head Wo Contrast  Result Date: 11/03/2016 CLINICAL DATA:  Fall this morning with generalized weakness 1 week. Right-sided headache. Breast cancer. EXAM: CT HEAD WITHOUT CONTRAST TECHNIQUE: Contiguous axial images were obtained from the base of the skull through the vertex without intravenous contrast. COMPARISON:  10/16/2016 and 02/09/2015 FINDINGS: Brain: Ventricles, cisterns and other CSF spaces are within normal. No mass, mass effect, shift of midline structures or acute  hemorrhage. No evidence of acute infarction. Minimal chronic ischemic microvascular disease. Vascular: Calcified atherosclerotic plaque over the cavernous segment of the internal carotid arteries. Skull: Within normal. Sinuses/Orbits: Within normal. Other: None. IMPRESSION: No acute intracranial findings. Mild chronic ischemic microvascular disease. Electronically Signed   By: Marin Olp M.D.   On: 11/03/2016 21:43   Ct Head Wo Contrast  Result Date: 10/16/2016 CLINICAL DATA:  Headache history of breast cancer EXAM: CT HEAD WITHOUT CONTRAST TECHNIQUE: Contiguous axial images were obtained from the base of the skull through the vertex without intravenous contrast. COMPARISON:  06/01/2015 FINDINGS: Brain: No acute territorial infarction, intracranial hemorrhage or extra-axial fluid collection is seen. There is no focal mass, mass effect or midline shift. There is mild cortical atrophy. The ventricles are non enlarged. Vascular: No hyperdense vessels.  Carotid artery calcifications. Skull: Normal. Negative for fracture or focal lesion. Sinuses/Orbits: No acute finding. Other: None IMPRESSION: No CT evidence for acute intracranial abnormality. Electronically Signed   By: Donavan Foil M.D.   On: 10/16/2016 23:27   US Carotid Bilateral (at [facility] Only)  Result Date: 11/05/2016 CLINICAL DATA:  Weakness EXAM: BILATERAL CAROTID DUPLEX ULTRASOUND TECHNIQUE: Pearline Cables scale imaging, color Doppler and duplex ultrasound were performed of bilateral carotid and vertebral arteries in the neck. COMPARISON:  None. FINDINGS: Criteria: Quantification of carotid stenosis is based on velocity parameters that correlate the residual internal carotid diameter with NASCET-based stenosis levels, using the diameter of the distal internal carotid lumen as the denominator for stenosis measurement. The following velocity measurements were obtained: RIGHT ICA:  91 cm/sec CCA:  782 cm/sec SYSTOLIC ICA/CCA RATIO:  0.9 DIASTOLIC ICA/CCA  RATIO:  2.1 ECA:  94 cm/sec LEFT ICA:  110 cm/sec CCA:  85 cm/sec SYSTOLIC ICA/CCA RATIO:  1.3 DIASTOLIC ICA/CCA RATIO:  1.9 ECA:  133 cm/sec RIGHT CAROTID ARTERY: Little if any plaque in the bulb. Low resistance internal carotid Doppler pattern. RIGHT VERTEBRAL ARTERY:  Antegrade. LEFT CAROTID ARTERY: Little if any plaque in the bulb. Low resistance internal carotid Doppler pattern. LEFT VERTEBRAL ARTERY:  Antegrade. IMPRESSION: Less than 50% stenosis in the right and left internal carotid arteries. Electronically Signed   By: Marybelle Killings M.D.   On: 11/05/2016 11:00   Dg Chest Port 1 View  Result Date: 11/04/2016 CLINICAL DATA:  Weakness and altered mental status. EXAM: PORTABLE CHEST 1 VIEW COMPARISON:  11/03/2016 FINDINGS: Right  IJ Port-A-Cath unchanged. Lungs are adequately inflated without airspace consolidation or effusion. Cardiomediastinal silhouette is within normal. There is calcified plaque over the thoracic aorta. There are mild degenerative changes of the spine. IMPRESSION: No acute cardiopulmonary disease. Aortic atherosclerosis. Electronically Signed   By: Marin Olp M.D.   On: 11/04/2016 22:39   Ct Venogram Head  Result Date: 11/05/2016 CLINICAL DATA:  Chronic headaches. History of breast cancer. Stroke and diabetes. EXAM: CT VENOGRAM HEAD TECHNIQUE: Postcontrast imaging only was obtained through the brain with multiplanar reconstruction. CONTRAST:  36mL ISOVUE-300 IOPAMIDOL (ISOVUE-300) INJECTION 61% COMPARISON:  CT head without contrast 11/04/2016. MRI head 02/09/2015 FINDINGS: Multiple enhancing lesions are present in the brain. There was no hemorrhage in these areas on yesterday's unenhanced study. These are most likely due to metastatic disease. There is minimal edema. No significant mass-effect or midline shift. 3 mm lesion left medial parietal lobe image 46 5 mm left frontal lesion axial image 41 4 mm enhancing lesion right parietal lobe axial image 41 4 mm enhancing lesion left  lower parietal lobe axial image 34 3 mm enhancing lesion left posterior temporal lobe axial image 25 7 mm enhancing lesion right pons axial image 15 5 mm enhancing lesion anterior pons axial image 12 14 mm lesion left lateral cerebellum axial image 14 9 mm lesion left mid cerebellum axial image 14 7 mm lesion right inferior medial cerebellum axial image 4 Negative for hydrocephalus.  Generalized atrophy. No skull lesions identified. Visualized paranasal sinuses normal. No orbital mass lesion. Normal enhancement of the dural venous sinuses. No evidence of clot or obstruction. IMPRESSION: 10 enhancing lesions in the brain with minimal edema. These are most consistent with metastatic disease. Consider MRI of the brain without and with contrast for further evaluation. Negative for dural venous sinus thrombosis. Electronically Signed   By: Franchot Gallo M.D.   On: 11/05/2016 16:13    ASSESSMENT: Acute encephalopathy of unknown etiology possibly secondary to metastatic breast cancer.  PLAN:    1. Acute encephalopathy: CT venogram of the head performed earlier today revealed 10 enhancing lesions in the brain most consistent with metastatic disease. MRI the brain has been ordered to confirm finding. Minimal edema has been noted. Okay to start steroids if MRI confirms metastatic disease. Although treatment would be whole brain radiation, this may be difficult given patient's encephalopathy and difficulty following directions. She unlikely we will be able to lie still to receive treatment. If her metal status improves, consider radiation oncology consult. Chemotherapy would not be helpful for metastatic brain lesions and given her current performance status, it is not recommended. Recommend palliative care consult. 2. Triple negative breast cancer: Patient recently completed chemotherapy on July 20, 2016.  Appreciate consult, will follow. I will be out of town starting Tuesday, November 06, 2016. Please call the  oncologist on-call if there are any questions or concerns.   Lloyd Huger, MD   11/05/2016 10:40 PM

## 2016-11-05 NOTE — Plan of Care (Signed)
Problem: Education: Goal: Knowledge of disease or condition will improve Outcome: Not Progressing Pt disoriented x4. Alert to self only. Unable to follow commands. Goal: Knowledge of patient specific risk factors addressed and post discharge goals established will improve Outcome: Not Progressing As above.  Problem: Health Behavior/Discharge Planning: Goal: Ability to manage health-related needs will improve Outcome: Not Progressing Pt needs total assist.  Problem: Nutrition: Goal: Risk of aspiration will decrease Outcome: Not Progressing Pt placed on full liquid diet, but did not eat nor drink today. Pt received ativan prior to EEG. Pt is drowsy/sleepy. VSS.  Problem: Education: Goal: Knowledge of [facility] Education information/materials will improve Outcome: Not Progressing As above

## 2016-11-05 NOTE — ED Notes (Signed)
Pt transport to 104 

## 2016-11-05 NOTE — ED Notes (Signed)
Pt. Able to drink from straw if commands are given one step at a time.  Pt. Has to be told to open mouth even if water is in front of her.  Pt. Will drink when straw is in mouth and patient is instructed to drink.

## 2016-11-05 NOTE — Progress Notes (Signed)
OT Cancellation Note  Patient Details Name: Erica Levy MRN: [id number] DOB: 1941-02-22   Cancelled Treatment:    Reason Eval/Treat Not Completed: Patient at procedure or test/ unavailable.  Order received and chart reviewed. Pt at EEG per son's report who was in her room.  Will attempt evaluation again tomorrow.  Thank you for the referral.  Chrys Racer, OTR/L ascom [phone] 11/05/16, 3:23 PM

## 2016-11-06 ENCOUNTER — Observation Stay: Payer: Medicare Other

## 2016-11-06 DIAGNOSIS — R296 Repeated falls: Secondary | ICD-10-CM | POA: Diagnosis present

## 2016-11-06 DIAGNOSIS — G910 Communicating hydrocephalus: Secondary | ICD-10-CM | POA: Diagnosis present

## 2016-11-06 DIAGNOSIS — W010XXA Fall on same level from slipping, tripping and stumbling without subsequent striking against object, initial encounter: Secondary | ICD-10-CM | POA: Diagnosis present

## 2016-11-06 DIAGNOSIS — C7931 Secondary malignant neoplasm of brain: Principal | ICD-10-CM

## 2016-11-06 DIAGNOSIS — G919 Hydrocephalus, unspecified: Secondary | ICD-10-CM

## 2016-11-06 DIAGNOSIS — Z9181 History of falling: Secondary | ICD-10-CM | POA: Diagnosis not present

## 2016-11-06 DIAGNOSIS — Z515 Encounter for palliative care: Secondary | ICD-10-CM | POA: Diagnosis not present

## 2016-11-06 DIAGNOSIS — Y92019 Unspecified place in single-family (private) house as the place of occurrence of the external cause: Secondary | ICD-10-CM | POA: Diagnosis not present

## 2016-11-06 DIAGNOSIS — R410 Disorientation, unspecified: Secondary | ICD-10-CM | POA: Diagnosis present

## 2016-11-06 DIAGNOSIS — Z66 Do not resuscitate: Secondary | ICD-10-CM

## 2016-11-06 DIAGNOSIS — Z923 Personal history of irradiation: Secondary | ICD-10-CM

## 2016-11-06 DIAGNOSIS — R51 Headache: Secondary | ICD-10-CM | POA: Diagnosis not present

## 2016-11-06 DIAGNOSIS — G9340 Encephalopathy, unspecified: Secondary | ICD-10-CM | POA: Diagnosis not present

## 2016-11-06 DIAGNOSIS — M159 Polyosteoarthritis, unspecified: Secondary | ICD-10-CM | POA: Diagnosis present

## 2016-11-06 DIAGNOSIS — Z79899 Other long term (current) drug therapy: Secondary | ICD-10-CM | POA: Diagnosis not present

## 2016-11-06 DIAGNOSIS — Z8249 Family history of ischemic heart disease and other diseases of the circulatory system: Secondary | ICD-10-CM | POA: Diagnosis not present

## 2016-11-06 DIAGNOSIS — Z7189 Other specified counseling: Secondary | ICD-10-CM | POA: Diagnosis not present

## 2016-11-06 DIAGNOSIS — K219 Gastro-esophageal reflux disease without esophagitis: Secondary | ICD-10-CM | POA: Diagnosis present

## 2016-11-06 DIAGNOSIS — R4182 Altered mental status, unspecified: Secondary | ICD-10-CM | POA: Diagnosis not present

## 2016-11-06 DIAGNOSIS — E119 Type 2 diabetes mellitus without complications: Secondary | ICD-10-CM | POA: Diagnosis present

## 2016-11-06 DIAGNOSIS — G43909 Migraine, unspecified, not intractable, without status migrainosus: Secondary | ICD-10-CM | POA: Diagnosis present

## 2016-11-06 DIAGNOSIS — C7932 Secondary malignant neoplasm of cerebral meninges: Secondary | ICD-10-CM | POA: Diagnosis present

## 2016-11-06 DIAGNOSIS — Z171 Estrogen receptor negative status [ER-]: Secondary | ICD-10-CM | POA: Diagnosis not present

## 2016-11-06 DIAGNOSIS — Z9221 Personal history of antineoplastic chemotherapy: Secondary | ICD-10-CM | POA: Diagnosis not present

## 2016-11-06 DIAGNOSIS — Z7982 Long term (current) use of aspirin: Secondary | ICD-10-CM

## 2016-11-06 DIAGNOSIS — E7800 Pure hypercholesterolemia, unspecified: Secondary | ICD-10-CM | POA: Diagnosis present

## 2016-11-06 DIAGNOSIS — Z8673 Personal history of transient ischemic attack (TIA), and cerebral infarction without residual deficits: Secondary | ICD-10-CM | POA: Diagnosis not present

## 2016-11-06 DIAGNOSIS — E876 Hypokalemia: Secondary | ICD-10-CM | POA: Diagnosis present

## 2016-11-06 DIAGNOSIS — C50412 Malignant neoplasm of upper-outer quadrant of left female breast: Secondary | ICD-10-CM

## 2016-11-06 DIAGNOSIS — I10 Essential (primary) hypertension: Secondary | ICD-10-CM | POA: Diagnosis present

## 2016-11-06 DIAGNOSIS — M810 Age-related osteoporosis without current pathological fracture: Secondary | ICD-10-CM | POA: Diagnosis present

## 2016-11-06 DIAGNOSIS — Z803 Family history of malignant neoplasm of breast: Secondary | ICD-10-CM | POA: Diagnosis not present

## 2016-11-06 DIAGNOSIS — E039 Hypothyroidism, unspecified: Secondary | ICD-10-CM | POA: Diagnosis present

## 2016-11-06 DIAGNOSIS — M25552 Pain in left hip: Secondary | ICD-10-CM | POA: Diagnosis present

## 2016-11-06 DIAGNOSIS — M25551 Pain in right hip: Secondary | ICD-10-CM | POA: Diagnosis present

## 2016-11-06 DIAGNOSIS — G131 Other systemic atrophy primarily affecting central nervous system in neoplastic disease: Secondary | ICD-10-CM | POA: Diagnosis present

## 2016-11-06 LAB — BASIC METABOLIC PANEL
ANION GAP: 7 (ref 5–15)
BUN: 19 mg/dL (ref 6–20)
CHLORIDE: 102 mmol/L (ref 101–111)
CO2: 26 mmol/L (ref 22–32)
Calcium: 9.1 mg/dL (ref 8.9–10.3)
Creatinine, Ser: 0.55 mg/dL (ref 0.44–1.00)
GFR calc Af Amer: >60 mL/min (ref >60)
Glucose, Bld: 116 mg/dL — ABNORMAL HIGH (ref 65–99)
POTASSIUM: 3.4 mmol/L — AB (ref 3.5–5.1)
SODIUM: 135 mmol/L (ref 135–145)

## 2016-11-06 LAB — CBC
HCT: 36.6 % (ref 35.0–47.0)
HEMOGLOBIN: 13.2 g/dL (ref 12.0–16.0)
MCH: 31.5 pg (ref 26.0–34.0)
MCHC: 36 g/dL (ref 32.0–36.0)
MCV: 87.6 fL (ref 80.0–100.0)
PLATELETS: 173 10*3/uL (ref 150–440)
RBC: 4.18 MIL/uL (ref 3.80–5.20)
RDW: 16.1 % — ABNORMAL HIGH (ref 11.5–14.5)
WBC: 5.7 10*3/uL (ref 3.6–11.0)

## 2016-11-06 LAB — RPR: RPR Ser Ql: NONREACTIVE

## 2016-11-06 LAB — HEMOGLOBIN A1C
Hgb A1c MFr Bld: 5.8 % — ABNORMAL HIGH (ref 4.8–5.6)
Mean Plasma Glucose: 120 mg/dL

## 2016-11-06 MED ORDER — POTASSIUM CHLORIDE CRYS ER 20 MEQ PO TBCR
20.0000 meq | EXTENDED_RELEASE_TABLET | Freq: Once | ORAL | Status: AC
  Administered 2016-11-06: 20 meq via ORAL
  Filled 2016-11-06: qty 1

## 2016-11-06 MED ORDER — DEXAMETHASONE SODIUM PHOSPHATE 10 MG/ML IJ SOLN: 10.0000 mg | Freq: Four times a day (QID) | INTRAMUSCULAR | Status: DC

## 2016-11-06 MED ORDER — DEXAMETHASONE SODIUM PHOSPHATE 10 MG/ML IJ SOLN
4.0000 mg | Freq: Four times a day (QID) | INTRAMUSCULAR | Status: DC
  Administered 2016-11-07 – 2016-11-09 (×10): 4 mg via INTRAVENOUS
  Filled 2016-11-06 (×10): qty 1

## 2016-11-06 MED ORDER — LORAZEPAM 2 MG/ML IJ SOLN
2.0000 mg | Freq: Once | INTRAMUSCULAR | Status: AC
Start: 2016-11-06 — End: 2016-11-06
  Administered 2016-11-06: 2 mg via INTRAVENOUS
  Filled 2016-11-06: qty 1

## 2016-11-06 MED ORDER — GADOBENATE DIMEGLUMINE 529 MG/ML IV SOLN
10.0000 mL | Freq: Once | INTRAVENOUS | Status: AC | PRN
  Administered 2016-11-06: 12:00:00 10 mL via INTRAVENOUS

## 2016-11-06 MED ORDER — ENSURE ENLIVE PO LIQD
237.0000 mL | Freq: Three times a day (TID) | ORAL | Status: DC
  Administered 2016-11-06 – 2016-11-07 (×5): 237 mL via ORAL

## 2016-11-06 MED ORDER — DEXAMETHASONE SODIUM PHOSPHATE 10 MG/ML IJ SOLN
10.0000 mg | Freq: Two times a day (BID) | INTRAMUSCULAR | Status: DC
  Administered 2016-11-06 (×2): 10 mg via INTRAVENOUS
  Filled 2016-11-06 (×2): qty 1

## 2016-11-06 MED ORDER — SODIUM CHLORIDE 0.9 % IV SOLN
500.0000 mg | Freq: Two times a day (BID) | INTRAVENOUS | Status: DC
  Administered 2016-11-06 – 2016-11-07 (×3): 500 mg via INTRAVENOUS
  Filled 2016-11-06 (×4): qty 5

## 2016-11-06 NOTE — Progress Notes (Signed)
Pt awake, alert and oriented-following commands with clear speech.  Notified daughter Bevely Palmer per her request.

## 2016-11-06 NOTE — Care Management Obs Status (Signed)
[facility] NOTIFICATION   Patient Details  Name: Erica Levy MRN: [id number] Date of Birth: Dec 23, 1940   Medicare Observation Status Notification Given:  Yes    Shelbie Ammons, RN 11/06/2016, 10:08 AM

## 2016-11-06 NOTE — Progress Notes (Signed)
[name]                                                                                                                                                                                  Patient Demographics   Erica Levy, is a 76 y.o. female, DOB - [birth year], WER:[id number]  Admit date - 11/04/2016   Admitting Physician Lance Coon, MD  Outpatient Primary MD for the patient is WHITE, [name], NP   LOS - 1  Subjective: Pt mental status improved,  ctv showed brain lesions    Review of Systems:    CONSTITUTIONAL: No documented fever. No fatigue, weakness. No weight gain, no weight loss.  EYES: No blurry or double vision.  ENT: No tinnitus. No postnasal drip. No redness of the oropharynx.  RESPIRATORY: No cough, no wheeze, no hemoptysis. No dyspnea.  CARDIOVASCULAR: No chest pain. No orthopnea. No palpitations. No syncope.  GASTROINTESTINAL: No nausea, no vomiting or diarrhea. No abdominal pain. No melena or hematochezia.  GENITOURINARY:  No urgency. No frequency. No dysuria. No hematuria. No obstructive symptoms. No discharge. No pain. No significant abnormal bleeding ENDOCRINE: No polyuria or nocturia. No heat or cold intolerance.  HEMATOLOGY: No anemia. No bruising. No bleeding. No purpura. No petechiae INTEGUMENTARY: No rashes. No lesions.  MUSCULOSKELETAL: No arthritis. No swelling. No gout.  NEUROLOGIC: unsteady gait PSYCHIATRIC: No anxiety. No insomnia. No ADD.      Vitals:   Vitals:   11/05/16 1604 11/05/16 1958 11/06/16 0112 11/06/16 0414  BP: (!) 110/52 (!) 130/48 (!) 141/52 (!) 143/41  Pulse: 68 71 68 (!) 59  Resp: 16 20 20 20   Temp:  97.8 F (36.6 C) 98.7 F (37.1 C) 98.6 F (37 C)  TempSrc:  Oral    SpO2: 97% 100% 100% 98%  Weight:      Height:        Wt Readings from Last 3 Encounters:  11/04/16 142 lb (64.4 kg)  11/03/16 142 lb (64.4 kg)  11/02/16 142 lb (64.4 kg)     Intake/Output Summary  (Last 24 hours) at 11/06/16 1302 Last data filed at 11/06/16 1014  Gross per 24 hour  Intake              960 ml  Output                0 ml  Net              960 ml    Physical Exam:   GENERAL:  Pt awake HEAD, EYES, EARS, NOSE AND THROAT: Atraumatic, normocephalic. Extraocular muscles are intact. Pupils equal and reactive to light. Sclerae  anicteric. No conjunctival injection. No oro-pharyngeal erythema.  NECK: Supple. There is no jugular venous distention. No bruits, no lymphadenopathy, no thyromegaly.  HEART: Regular rate and rhythm,. No murmurs, no rubs, no clicks.  LUNGS: Clear to auscultation bilaterally. No rales or rhonchi. No wheezes.  ABDOMEN: Soft, flat, nontender, nondistended. Has good bowel sounds. No hepatosplenomegaly appreciated.  EXTREMITIES: No evidence of any cyanosis, clubbing, or peripheral edema.  +2 pedal and radial pulses bilaterally.  NEUROLOGIC: confused SKIN: Moist and warm with no rashes appreciated.  Psych:  Pt not anxious and depressed LN: No inguinal LN enlargement    Antibiotics   Anti-infectives    None      Medications   Scheduled Meds: . aspirin  300 mg Rectal Daily   Or  . aspirin  325 mg Oral Daily  . atorvastatin  80 mg Oral q1800  . chlorhexidine  15 mL Mouth Rinse BID  . dexamethasone  10 mg Intravenous Q12H  . enoxaparin (LOVENOX) injection  40 mg Subcutaneous Q24H  . feeding supplement (ENSURE ENLIVE)  237 mL Oral TID BM  . levETIRAcetam  500 mg Intravenous Q12H  . levothyroxine  50 mcg Oral QAC breakfast  . mouth rinse  15 mL Mouth Rinse q12n4p  . sodium chloride flush  3 mL Intravenous Q12H   Continuous Infusions: PRN Meds:.acetaminophen **OR** acetaminophen, ondansetron **OR** ondansetron (ZOFRAN) IV   Data Review:   Micro Results No results found for this or any previous visit (from the past 240 hour(s)).  Radiology Reports Dg Chest 1 View  Result Date: 11/03/2016 CLINICAL DATA:  Fall this morning. EXAM: CHEST 1  VIEW COMPARISON:  06/06/2016 FINDINGS: Right IJ Port-A-Cath unchanged. Lungs are adequately inflated without consolidation, effusion or pneumothorax. Cardiomediastinal silhouette is normal. Calcified plaque over the thoracic aorta. Mild degenerate change of the spine. IMPRESSION: No active disease. Electronically Signed   By: Marin Olp M.D.   On: 11/03/2016 21:13   Dg Pelvis 1-2 Views  Result Date: 11/03/2016 CLINICAL DATA:  Fall this morning.  Right hip pain. EXAM: PELVIS - 1-2 VIEW COMPARISON:  11/10/2015 and 06/28/2009 FINDINGS: There is no evidence of pelvic fracture or diastasis. No pelvic bone lesions are seen. Degenerative change of the spine. IMPRESSION: No acute findings. Electronically Signed   By: Marin Olp M.D.   On: 11/03/2016 21:16   Ct Head Wo Contrast  Result Date: 11/04/2016 CLINICAL DATA:  76 y/o F; increased confusion today. Fall yesterday. History of breast cancer. EXAM: CT HEAD WITHOUT CONTRAST TECHNIQUE: Contiguous axial images were obtained from the base of the skull through the vertex without intravenous contrast. COMPARISON:  11/03/2016 CT of the head. FINDINGS: Brain: No evidence of acute infarction, hemorrhage, hydrocephalus, extra-axial collection or mass lesion/mass effect. Stable mild parenchymal volume loss. Vascular: Extensive calcific atherosclerosis of the cavernous and paraclinoid internal carotid arteries. Skull: Normal. Negative for fracture or focal lesion. Sinuses/Orbits: No acute finding. Other: Right intra-ocular lens replacement. IMPRESSION: 1. No acute intracranial abnormality identified. 2. Stable mild parenchymal volume loss. Electronically Signed   By: Kristine Garbe M.D.   On: 11/04/2016 22:30   Ct Head Wo Contrast  Result Date: 11/03/2016 CLINICAL DATA:  Fall this morning with generalized weakness 1 week. Right-sided headache. Breast cancer. EXAM: CT HEAD WITHOUT CONTRAST TECHNIQUE: Contiguous axial images were obtained from the base of the  skull through the vertex without intravenous contrast. COMPARISON:  10/16/2016 and 02/09/2015 FINDINGS: Brain: Ventricles, cisterns and other CSF spaces are within normal. No mass, mass  effect, shift of midline structures or acute hemorrhage. No evidence of acute infarction. Minimal chronic ischemic microvascular disease. Vascular: Calcified atherosclerotic plaque over the cavernous segment of the internal carotid arteries. Skull: Within normal. Sinuses/Orbits: Within normal. Other: None. IMPRESSION: No acute intracranial findings. Mild chronic ischemic microvascular disease. Electronically Signed   By: Marin Olp M.D.   On: 11/03/2016 21:43   Ct Head Wo Contrast  Result Date: 10/16/2016 CLINICAL DATA:  Headache history of breast cancer EXAM: CT HEAD WITHOUT CONTRAST TECHNIQUE: Contiguous axial images were obtained from the base of the skull through the vertex without intravenous contrast. COMPARISON:  06/01/2015 FINDINGS: Brain: No acute territorial infarction, intracranial hemorrhage or extra-axial fluid collection is seen. There is no focal mass, mass effect or midline shift. There is mild cortical atrophy. The ventricles are non enlarged. Vascular: No hyperdense vessels.  Carotid artery calcifications. Skull: Normal. Negative for fracture or focal lesion. Sinuses/Orbits: No acute finding. Other: None IMPRESSION: No CT evidence for acute intracranial abnormality. Electronically Signed   By: Donavan Foil M.D.   On: 10/16/2016 23:27   Mr Jeri Cos IZ Contrast  Result Date: 11/06/2016 CLINICAL DATA:  Worsening confusion and falls. History of breast cancer. EXAM: MRI HEAD WITHOUT AND WITH CONTRAST TECHNIQUE: Multiplanar, multiecho pulse sequences of the brain and surrounding structures were obtained without and with intravenous contrast. CONTRAST:  27mL MULTIHANCE GADOBENATE DIMEGLUMINE 529 MG/ML IV SOLN COMPARISON:  Noncontrast CT head 11/04/2016. Postcontrast CT head 11/05/2016. FINDINGS: Brain:  Widespread intracranial metastatic lesions are seen involving both cerebellar hemispheres, the brainstem, and both cerebral hemispheres. Index lesion LEFT lateral superior cerebellum measures 10 x 17 x 14 mm. Widespread metastatic lesions are seen throughout both cerebellar hemispheres, including the cerebellar tonsils. There is enhancement of the vermian cerebellar folia, consistent with leptomeningeal spread of disease. Index lesion in the brainstem measures 6 mm, RIGHT upper pons. Lower pontine, LEFT pontine, and midbrain lesions are also observed. No medullary metastases. Multiple supratentorial metastatic lesions are seen, the largest of which is 5 mm, LEFT frontal operculum. Other supratentorial areas involved include the LEFT temporal, RIGHT frontal, BILATERAL parietal lobes. Asymmetric enhancement of the subdural space, along the superior sagittal sinus to the RIGHT of midline, representing early pachymeningeal disease. No acute stroke or hemorrhage. Proteinaceous fluid layers in both ventricles. The ventricles are enlarged, including the temporal horns, consistent with mild communicating hydrocephalus. No extra-axial fluid. No significant midline shift. Vascular: Normal flow voids. Skull and upper cervical spine: Normal marrow signal. Cervical spondylosis. No pathologic compression fracture in the upper cervical region. Sinuses/Orbits: Negative. Other: None. IMPRESSION: Widespread intracranial metastatic disease as described involving BILATERAL cerebral hemispheres, brainstem, and BILATERAL cerebellar hemispheres. 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 A call has been placed to the ordering provider. Electronically Signed   By: Staci Righter M.D.   On: 11/06/2016 12:47   US Carotid Bilateral (at [facility] Only)  Result Date: 11/05/2016 CLINICAL DATA:  Weakness EXAM: BILATERAL CAROTID DUPLEX ULTRASOUND TECHNIQUE: Pearline Cables scale imaging, color  Doppler and duplex ultrasound were performed of bilateral carotid and vertebral arteries in the neck. COMPARISON:  None. FINDINGS: Criteria: Quantification of carotid stenosis is based on velocity parameters that correlate the residual internal carotid diameter with NASCET-based stenosis levels, using the diameter of the distal internal carotid lumen as the denominator for stenosis measurement. The following velocity measurements were obtained: RIGHT ICA:  91 cm/sec CCA:  124 cm/sec SYSTOLIC ICA/CCA RATIO:  0.9 DIASTOLIC ICA/CCA RATIO:  2.1 ECA:  94 cm/sec LEFT ICA:  110 cm/sec CCA:  85 cm/sec SYSTOLIC ICA/CCA RATIO:  1.3 DIASTOLIC ICA/CCA RATIO:  1.9 ECA:  133 cm/sec RIGHT CAROTID ARTERY: Little if any plaque in the bulb. Low resistance internal carotid Doppler pattern. RIGHT VERTEBRAL ARTERY:  Antegrade. LEFT CAROTID ARTERY: Little if any plaque in the bulb. Low resistance internal carotid Doppler pattern. LEFT VERTEBRAL ARTERY:  Antegrade. IMPRESSION: Less than 50% stenosis in the right and left internal carotid arteries. Electronically Signed   By: Marybelle Killings M.D.   On: 11/05/2016 11:00   Dg Chest Port 1 View  Result Date: 11/04/2016 CLINICAL DATA:  Weakness and altered mental status. EXAM: PORTABLE CHEST 1 VIEW COMPARISON:  11/03/2016 FINDINGS: Right IJ Port-A-Cath unchanged. Lungs are adequately inflated without airspace consolidation or effusion. Cardiomediastinal silhouette is within normal. There is calcified plaque over the thoracic aorta. There are mild degenerative changes of the spine. IMPRESSION: No acute cardiopulmonary disease. Aortic atherosclerosis. Electronically Signed   By: Marin Olp M.D.   On: 11/04/2016 22:39   Ct Venogram Head  Result Date: 11/05/2016 CLINICAL DATA:  Chronic headaches. History of breast cancer. Stroke and diabetes. EXAM: CT VENOGRAM HEAD TECHNIQUE: Postcontrast imaging only was obtained through the brain with multiplanar reconstruction. CONTRAST:  61mL ISOVUE-300  IOPAMIDOL (ISOVUE-300) INJECTION 61% COMPARISON:  CT head without contrast 11/04/2016. MRI head 02/09/2015 FINDINGS: Multiple enhancing lesions are present in the brain. There was no hemorrhage in these areas on yesterday's unenhanced study. These are most likely due to metastatic disease. There is minimal edema. No significant mass-effect or midline shift. 3 mm lesion left medial parietal lobe image 46 5 mm left frontal lesion axial image 41 4 mm enhancing lesion right parietal lobe axial image 41 4 mm enhancing lesion left lower parietal lobe axial image 34 3 mm enhancing lesion left posterior temporal lobe axial image 25 7 mm enhancing lesion right pons axial image 15 5 mm enhancing lesion anterior pons axial image 12 14 mm lesion left lateral cerebellum axial image 14 9 mm lesion left mid cerebellum axial image 14 7 mm lesion right inferior medial cerebellum axial image 4 Negative for hydrocephalus.  Generalized atrophy. No skull lesions identified. Visualized paranasal sinuses normal. No orbital mass lesion. Normal enhancement of the dural venous sinuses. No evidence of clot or obstruction. IMPRESSION: 10 enhancing lesions in the brain with minimal edema. These are most consistent with metastatic disease. Consider MRI of the brain without and with contrast for further evaluation. Negative for dural venous sinus thrombosis. Electronically Signed   By: Franchot Gallo M.D.   On: 11/05/2016 16:13     CBC  Recent Labs Lab 11/03/16 2111 11/04/16 2153 11/05/16 0507 11/06/16 0644  WBC 5.8 8.4 9.1 5.7  HGB 14.1 13.3 13.2 13.2  HCT 38.9 37.2 36.6 36.6  PLT 197 177 179 173  MCV 87.0 87.8 87.7 87.6  MCH 31.6 31.4 31.6 31.5  MCHC 36.3* 35.8 36.0 36.0  RDW 15.5* 15.8* 15.8* 16.1*  LYMPHSABS 0.6* 0.7*  --   --   MONOABS 0.6 0.6  --   --   EOSABS 0.0 0.0  --   --   BASOSABS 0.0 0.0  --   --     Chemistries   Recent Labs Lab 11/03/16 2111 11/04/16 2153 11/05/16 0507 11/06/16 0644  NA 134* 133*  133* 135  K 3.1* 3.5 3.5 3.4*  CL 99* 102 101 102  CO2 26 23 25 26   GLUCOSE 154* 186*  144* 116*  BUN 31* 26* 21* 19  CREATININE 0.87 0.71 0.74 0.55  CALCIUM 9.6 8.9 8.9 9.1  AST 35 29  --   --   ALT 35 34  --   --   ALKPHOS 74 71  --   --   BILITOT 1.8* 1.5*  --   --    ------------------------------------------------------------------------------------------------------------------ estimated creatinine clearance is 54.3 mL/min (by C-G formula based on SCr of 0.55 mg/dL). ------------------------------------------------------------------------------------------------------------------  Recent Labs  11/05/16 0507  HGBA1C 5.8*   ------------------------------------------------------------------------------------------------------------------  Recent Labs  11/05/16 0507  CHOL 142  HDL 52  LDLCALC 75  TRIG 74  CHOLHDL 2.7   ------------------------------------------------------------------------------------------------------------------ No results for input(s): TSH, T4TOTAL, T3FREE, THYROIDAB in the last 72 hours.  Invalid input(s): FREET3 ------------------------------------------------------------------------------------------------------------------  Recent Labs  11/05/16 1352  VITAMINB12 187    Coagulation profile No results for input(s): INR, PROTIME in the last 168 hours.  No results for input(s): DDIMER in the last 72 hours.  Cardiac Enzymes  Recent Labs Lab 11/03/16 2111 11/04/16 2153  TROPONINI <0.03 <0.03   ------------------------------------------------------------------------------------------------------------------ Invalid input(s): POCBNP    Assessment & Plan  Patient is a 76 year old admitted with acute encephalopathy 1.  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   2.    Hypothyroidism continue syntrhoid 3.  Essential  HTN (hypertension) bp stable  4.   GERD (gastroesophageal reflux disease) not on any medication currently  5. Diabetes ([place]) ssi      Code Status Orders        Start     Ordered   11/05/16 0318  Do not attempt resuscitation (DNR)  Continuous    Question Answer Comment  In the event of cardiac or respiratory ARREST Do not call a "code blue"   In the event of cardiac or respiratory ARREST Do not perform Intubation, CPR, defibrillation or ACLS   In the event of cardiac or respiratory ARREST Use medication by any route, position, wound care, and other measures to relive pain and suffering. May use oxygen, suction and manual treatment of airway obstruction as needed for comfort.      11/05/16 0317    Code Status History    Date Active Date Inactive Code Status Order ID Comments User Context   06/20/2016  9:06 AM 06/22/2016  1:01 PM DNR [id number]  Demetrios Loll, MD Inpatient   06/17/2016  8:23 PM 06/20/2016  9:06 AM Full Code [id number]  Idelle Crouch, MD Inpatient   06/06/2016 12:18 PM 06/07/2016  5:40 PM Full Code [id number]  Lytle Butte, MD ED   01/13/2016  4:52 PM 01/14/2016  4:12 PM Full Code [id number]  Leonie Green, MD Inpatient   02/09/2015  2:48 PM 02/10/2015  8:32 PM Full Code [id number]  Dustin Flock, MD Inpatient           Consults  neuro   DVT Prophylaxis  Lovenox    Lab Results  Component Value Date   PLT 173 11/06/2016     Time Spent in minutes  29min  Greater than 50% of time spent in care coordination and counseling patient regarding the condition and plan of care.   Dustin Flock M.D on 11/06/2016 at 1:02 PM  Between 7am to 6pm - Pager - [phone]  After 6pm go to [email or web address] - [facility]  Office  [phone]

## 2016-11-06 NOTE — Progress Notes (Signed)
SATURATION QUALIFICATIONS: (This note is used to comply with regulatory documentation for home oxygen)  Patient Saturations on Room Air at Rest = 93%  Patient Saturations on Room Air while Ambulating = 86%  Patient Saturations on 2 Liters of oxygen while Ambulating = 95%  Please briefly explain why patient needs home oxygen:  COPD

## 2016-11-06 NOTE — Care Management (Signed)
Admitted to [facility] with the diagnosis of acute encephalopathy under inpatient status, but changed to observation. Lives with husband, Lanae Boast, [phone]). Daughter is Melessa [phone]). Prescriptions are filled at [facility] in [place]. No home Health. No skilled facility. No home oxygen. Uses rolling walker to aid in ambulation. Fell Saturday and Sunday. Decreased appetite for a while.  Breast Cancer. Just finished chemotherapy and radiation per family.  Scheduled for GB surgery Thursday with Dr. Rochel Brome. Encouraged family to let Dr. Tamala Julian know that Ms. Marilynn Rail is in the hospital Shelbie Ammons RN MSN [place] [facility] [phone]

## 2016-11-06 NOTE — Progress Notes (Signed)
[facility] Hematology/Oncology Progress Note  Date of admission: 11/04/2016  Hospital day:  11/06/2016  Chief Complaint: Erica Levy is a 76 y.o. female with a history of stage IIA triple negative left breast cancer who was admitted with altered mental status.  Subjective: Patient without complaint after returning from head MRI after sedation.  Social History: The patient is accompanied by her children (2 sons and 2 daughters) today.  Allergies: No Known Allergies  Scheduled Medications: . aspirin  300 mg Rectal Daily   Or  . aspirin  325 mg Oral Daily  . atorvastatin  80 mg Oral q1800  . chlorhexidine  15 mL Mouth Rinse BID  . dexamethasone  10 mg Intravenous Q12H  . enoxaparin (LOVENOX) injection  40 mg Subcutaneous Q24H  . feeding supplement (ENSURE ENLIVE)  237 mL Oral TID BM  . levETIRAcetam  500 mg Intravenous Q12H  . levothyroxine  50 mcg Oral QAC breakfast  . mouth rinse  15 mL Mouth Rinse q12n4p  . sodium chloride flush  3 mL Intravenous Q12H    Review of Systems: GENERAL:  Minimally responsive (s/p sedation).  No fevers, sweats or weight loss. PERFORMANCE STATUS (ECOG): 4 Per family, patient has had increasing headaches, falls x 2, and confusion.  Physical Exam: Blood pressure 128/69, pulse 92, temperature 97.9 F (36.6 C), temperature source Oral, resp. rate 20, height _0  (1.575 m), weight 142 lb (64.4 kg), SpO2 97 %.  GENERAL:  Well developed, well nourished, woman poorly responsive on the medical unit in no acute distress. HEAD:  Short gray hair.  Normocephalic, atraumatic, face symmetric, no Cushingoid features. EYES:  Brown eyes.  Pupils equal round and reactive to light and accomodation.  No conjunctivitis or scleral icterus. ENT:  Oropharynx clear without lesion.  Tongue normal. Mucous membranes moist.  RESPIRATORY:  Clear to auscultation without rales, wheezes or rhonchi. CARDIOVASCULAR:  Regular rate and rhythm without murmur,  rub or gallop. ABDOMEN:  Soft, non-tender, with active bowel sounds, and no hepatosplenomegaly.  No masses. SKIN:  No rashes, ulcers or lesions. EXTREMITIES: No edema, no skin discoloration or tenderness.  No palpable cords. LYMPH NODES: No palpable cervical, supraclavicular, axillary or inguinal adenopathy  NEUROLOGICAL: Opens eyes to command and sternal rub.  Squeezes hands appropriately.  Moves all 4 extremities.  No Babinski or clonus.   Results for orders placed or performed during the hospital encounter of 11/04/16 (from the past 48 hour(s))  Glucose, capillary     Status: Abnormal   Collection Time: 11/04/16 10:29 PM  Result Value Ref Range   Glucose-Capillary 168 (H) 65 - 99 mg/dL  Basic metabolic panel     Status: Abnormal   Collection Time: 11/05/16  5:07 AM  Result Value Ref Range   Sodium 133 (L) 135 - 145 mmol/L   Potassium 3.5 3.5 - 5.1 mmol/L   Chloride 101 101 - 111 mmol/L   CO2 25 22 - 32 mmol/L   Glucose, Bld 144 (H) 65 - 99 mg/dL   BUN 21 (H) 6 - 20 mg/dL   Creatinine, Ser 0.74 0.44 - 1.00 mg/dL   Calcium 8.9 8.9 - 10.3 mg/dL   GFR calc non Af Amer >60 >60 mL/min   GFR calc Af Amer >60 >60 mL/min    Comment: (NOTE) The eGFR has been calculated using the CKD EPI equation. This calculation has not been validated in all clinical situations. eGFR's persistently <60 mL/min signify possible Chronic Kidney Disease.  Anion gap 7 5 - 15  CBC     Status: Abnormal   Collection Time: 11/05/16  5:07 AM  Result Value Ref Range   WBC 9.1 3.6 - 11.0 K/uL   RBC 4.18 3.80 - 5.20 MIL/uL   Hemoglobin 13.2 12.0 - 16.0 g/dL   HCT 36.6 35.0 - 47.0 %   MCV 87.7 80.0 - 100.0 fL   MCH 31.6 26.0 - 34.0 pg   MCHC 36.0 32.0 - 36.0 g/dL   RDW 15.8 (H) 11.5 - 14.5 %   Platelets 179 150 - 440 K/uL  Hemoglobin A1c     Status: Abnormal   Collection Time: 11/05/16  5:07 AM  Result Value Ref Range   Hgb A1c MFr Bld 5.8 (H) 4.8 - 5.6 %    Comment: (NOTE)         Pre-diabetes: 5.7 -  6.4         Diabetes: >6.4         Glycemic control for adults with diabetes: <7.0    Mean Plasma Glucose 120 mg/dL    Comment: (NOTE) Performed At: [facility] [place], [place] [id number] Lindon Romp MD [id number]   Lipid panel     Status: None   Collection Time: 11/05/16  5:07 AM  Result Value Ref Range   Cholesterol 142 0 - 200 mg/dL   Triglycerides 74 <150 mg/dL   HDL 52 >40 mg/dL   Total CHOL/HDL Ratio 2.7 RATIO   VLDL 15 0 - 40 mg/dL   LDL Cholesterol 75 0 - 99 mg/dL    Comment:        Total Cholesterol/HDL:CHD Risk Coronary Heart Disease Risk Table                     Men   Women  1/2 Average Risk   3.4   3.3  Average Risk       5.0   4.4  2 X Average Risk   9.6   7.1  3 X Average Risk  23.4   11.0        Use the calculated Patient Ratio above and the CHD Risk Table to determine the patient's CHD Risk.        ATP III CLASSIFICATION (LDL):  <100     mg/dL   Optimal  100-129  mg/dL   Near or Above                    Optimal  130-159  mg/dL   Borderline  160-189  mg/dL   High  >190     mg/dL   Very High   Vitamin B12     Status: None   Collection Time: 11/05/16  1:52 PM  Result Value Ref Range   Vitamin B-12 187 180 - 914 pg/mL    Comment: (NOTE) This assay is not validated for testing neonatal or myeloproliferative syndrome specimens for Vitamin B12 levels. Performed at [facility], [place] [address], [place], [place] [address]   RPR     Status: None   Collection Time: 11/05/16  1:52 PM  Result Value Ref Range   RPR Ser Ql Non Reactive Non Reactive    Comment: (NOTE) Performed At: [facility] [address] [place], [place] [id number] Lindon Romp MD IZ:[id number]   CBC     Status: Abnormal   Collection Time: 11/06/16  6:44 AM  Result Value Ref Range  WBC 5.7 3.6 - 11.0 K/uL   RBC 4.18 3.80 - 5.20 MIL/uL   Hemoglobin 13.2 12.0 - 16.0 g/dL   HCT 36.6 35.0 - 47.0 %   MCV 87.6 80.0 - 100.0 fL   MCH  31.5 26.0 - 34.0 pg   MCHC 36.0 32.0 - 36.0 g/dL   RDW 16.1 (H) 11.5 - 14.5 %   Platelets 173 150 - 440 K/uL  Basic metabolic panel     Status: Abnormal   Collection Time: 11/06/16  6:44 AM  Result Value Ref Range   Sodium 135 135 - 145 mmol/L   Potassium 3.4 (L) 3.5 - 5.1 mmol/L   Chloride 102 101 - 111 mmol/L   CO2 26 22 - 32 mmol/L   Glucose, Bld 116 (H) 65 - 99 mg/dL   BUN 19 6 - 20 mg/dL   Creatinine, Ser 0.55 0.44 - 1.00 mg/dL   Calcium 9.1 8.9 - 10.3 mg/dL   GFR calc non Af Amer >60 >60 mL/min   GFR calc Af Amer >60 >60 mL/min    Comment: (NOTE) The eGFR has been calculated using the CKD EPI equation. This calculation has not been validated in all clinical situations. eGFR's persistently <60 mL/min signify possible Chronic Kidney Disease.    Anion gap 7 5 - 15   Mr Jeri Cos Wo Contrast  Result Date: 11/06/2016 CLINICAL DATA:  Worsening confusion and falls. History of breast cancer. EXAM: MRI HEAD WITHOUT AND WITH CONTRAST TECHNIQUE: Multiplanar, multiecho pulse sequences of the brain and surrounding structures were obtained without and with intravenous contrast. CONTRAST:  70m MULTIHANCE GADOBENATE DIMEGLUMINE 529 MG/ML IV SOLN COMPARISON:  Noncontrast CT head 11/04/2016. Postcontrast CT head 11/05/2016. FINDINGS: Brain: Widespread intracranial metastatic lesions are seen involving both cerebellar hemispheres, the brainstem, and both cerebral hemispheres. Index lesion LEFT lateral superior cerebellum measures 10 x 17 x 14 mm. Widespread metastatic lesions are seen throughout both cerebellar hemispheres, including the cerebellar tonsils. There is enhancement of the vermian cerebellar folia, consistent with leptomeningeal spread of disease. Index lesion in the brainstem measures 6 mm, RIGHT upper pons. Lower pontine, LEFT pontine, and midbrain lesions are also observed. No medullary metastases. Multiple supratentorial metastatic lesions are seen, the largest of which is 5 mm, LEFT  frontal operculum. Other supratentorial areas involved include the LEFT temporal, RIGHT frontal, BILATERAL parietal lobes. Asymmetric enhancement of the subdural space, along the superior sagittal sinus to the RIGHT of midline, representing early pachymeningeal disease. No acute stroke or hemorrhage. Proteinaceous fluid layers in both ventricles. The ventricles are enlarged, including the temporal horns, consistent with mild communicating hydrocephalus. No extra-axial fluid. No significant midline shift. Vascular: Normal flow voids. Skull and upper cervical spine: Normal marrow signal. Cervical spondylosis. No pathologic compression fracture in the upper cervical region. Sinuses/Orbits: Negative. Other: None. IMPRESSION: Widespread intracranial metastatic disease as described involving BILATERAL cerebral hemispheres, brainstem, and BILATERAL cerebellar hemispheres. 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 A call has been placed to the ordering provider. Electronically Signed   By: JStaci RighterM.D.   On: 11/06/2016 12:47   [place]Carotid Bilateral (at [facility] And Ap Only)  Result Date: 11/05/2016 CLINICAL DATA:  Weakness EXAM: BILATERAL CAROTID DUPLEX ULTRASOUND TECHNIQUE: GPearline Cablesscale imaging, color Doppler and duplex ultrasound were performed of bilateral carotid and vertebral arteries in the neck. COMPARISON:  None. FINDINGS: Criteria: Quantification of carotid stenosis is based on velocity parameters that correlate the residual internal carotid diameter with NASCET-based stenosis  levels, using the diameter of the distal internal carotid lumen as the denominator for stenosis measurement. The following velocity measurements were obtained: RIGHT ICA:  91 cm/sec CCA:  536 cm/sec SYSTOLIC ICA/CCA RATIO:  0.9 DIASTOLIC ICA/CCA RATIO:  2.1 ECA:  94 cm/sec LEFT ICA:  110 cm/sec CCA:  85 cm/sec SYSTOLIC ICA/CCA RATIO:  1.3 DIASTOLIC ICA/CCA RATIO:  1.9 ECA:  133  cm/sec RIGHT CAROTID ARTERY: Little if any plaque in the bulb. Low resistance internal carotid Doppler pattern. RIGHT VERTEBRAL ARTERY:  Antegrade. LEFT CAROTID ARTERY: Little if any plaque in the bulb. Low resistance internal carotid Doppler pattern. LEFT VERTEBRAL ARTERY:  Antegrade. IMPRESSION: Less than 50% stenosis in the right and left internal carotid arteries. Electronically Signed   By: Marybelle Killings M.D.   On: 11/05/2016 11:00   Ct Venogram Head  Result Date: 11/05/2016 CLINICAL DATA:  Chronic headaches. History of breast cancer. Stroke and diabetes. EXAM: CT VENOGRAM HEAD TECHNIQUE: Postcontrast imaging only was obtained through the brain with multiplanar reconstruction. CONTRAST:  5m ISOVUE-300 IOPAMIDOL (ISOVUE-300) INJECTION 61% COMPARISON:  CT head without contrast 11/04/2016. MRI head 02/09/2015 FINDINGS: Multiple enhancing lesions are present in the brain. There was no hemorrhage in these areas on yesterday's unenhanced study. These are most likely due to metastatic disease. There is minimal edema. No significant mass-effect or midline shift. 3 mm lesion left medial parietal lobe image 46 5 mm left frontal lesion axial image 41 4 mm enhancing lesion right parietal lobe axial image 41 4 mm enhancing lesion left lower parietal lobe axial image 34 3 mm enhancing lesion left posterior temporal lobe axial image 25 7 mm enhancing lesion right pons axial image 15 5 mm enhancing lesion anterior pons axial image 12 14 mm lesion left lateral cerebellum axial image 14 9 mm lesion left mid cerebellum axial image 14 7 mm lesion right inferior medial cerebellum axial image 4 Negative for hydrocephalus.  Generalized atrophy. No skull lesions identified. Visualized paranasal sinuses normal. No orbital mass lesion. Normal enhancement of the dural venous sinuses. No evidence of clot or obstruction. IMPRESSION: 10 enhancing lesions in the brain with minimal edema. These are most consistent with metastatic disease.  Consider MRI of the brain without and with contrast for further evaluation. Negative for dural venous sinus thrombosis. Electronically Signed   By: CFranchot GalloM.D.   On: 11/05/2016 16:13    Assessment:  CASIANAE MINKLERis a 76y.o. female with stage IIA triple negative left breast cancer.  She completed chemotherapy on 07/20/2016 and breast radiation on 10/19/2016.  Per her family, she has had an intermittent headache for 6 weeks.  Symptoms progressed over the past 2 weeks.  She presented with a 3 day history of falls, confusion, and slurred speech.  On presentation to the ER, she was confused and not answering questions appropriately.  Head MRI today revealed widespread intracranial metastatic disease involving BILATERAL cerebral hemispheres, brainstem, and BILATERAL cerebellar hemispheres.  There was evidence of both pachymeningeal and leptomeningeal spread of tumor.  There was mild communicating hydrocephalus.  Plan: 1.  Oncology:  Discussed with patient's family findings on head MRI.  Per their report, she was more responsive before sedation (Ativan) and subsequent head MRI.  Discussed plan for IV steroids.  Decadron 4 mg IV q 6 hours.  Discussed plan for whole brain radiation once her mentation improves.   Discussed treatment is palliative.  Imaging reviewed with Dr. CBaruch Gouty  Agree with palliative care consult.  Support provided family.  2.  Code status:  DNR.   Lequita Asal, MD  11/06/2016, 10:15 PM

## 2016-11-06 NOTE — Consult Note (Signed)
Reason for Consult:confusion  Referring Physician: Dr. Posey Pronto   CC: confusion   Slight improvement in confusion compared to yesterday.  But still inconsistent with following commands.      Past Medical History:  Diagnosis Date  . Anemia   . Anemia due to chemotherapy   . Anxiety   . Cancer ([place])    breast  . Diabetes mellitus without complication ([place])    diet controlled  . Gallbladder calculus   . Generalized OA   . GERD (gastroesophageal reflux disease)   . Hypercholesteremia   . Hypertension   . Hypothyroid   . Migraine   . Osteoporosis   . Stroke ([place])   . TIA (transient ischemic attack)    July    Past Surgical History:  Procedure Laterality Date  . BREAST BIOPSY Left    bx/clip-neg  . BREAST BIOPSY Left 12/15/2015   path pending/us and stereo bx  . CATARACT EXTRACTION    . DILATION AND CURETTAGE OF UTERUS    . ENDOSCOPIC RETROGRADE CHOLANGIOPANCREATOGRAPHY (ERCP) WITH PROPOFOL N/A 06/19/2016   Procedure: ENDOSCOPIC RETROGRADE CHOLANGIOPANCREATOGRAPHY (ERCP) WITH PROPOFOL;  Surgeon: Lucilla Lame, MD;  Location: [facility] ENDOSCOPY;  Service: Endoscopy;  Laterality: N/A;  . EYE SURGERY Right    Cataract Extraction with IOL  . PARTIAL MASTECTOMY WITH AXILLARY SENTINEL LYMPH NODE BIOPSY Left 01/13/2016   Procedure: PARTIAL MASTECTOMY;  Surgeon: Leonie Green, MD;  Location: [facility] ORS;  Service: General;  Laterality: Left;  . PORTACATH PLACEMENT Right 01/13/2016   Procedure: INSERTION PORT-A-CATH;  Surgeon: Leonie Green, MD;  Location: [facility] ORS;  Service: General;  Laterality: Right;  . RETINAL DETACHMENT SURGERY Right    Dr. Starling Manns, [facility]  . TONSILLECTOMY      Family History  Problem Relation Age of Onset  . Hypertension Mother   . Osteoarthritis Mother   . Dementia Father   . Breast cancer Cousin 25    mat cousin    Social History:  reports that she has never smoked. She has never used smokeless tobacco. She reports that she does not drink alcohol  or use drugs.  No Known Allergies  Medications: I have reviewed the patient's current medications.  ROS: Unable to obtain as patient can't provide any history   Physical Examination: Blood pressure (!) 143/41, pulse (!) 59, temperature 98.6 F (37 C), resp. rate 20, height 5\' 2"  (1.575 m), weight 64.4 kg (142 lb), SpO2 98 %.    Neurological Examination   Mental Status: Alert to name only. Moans Inconsistently follows commands.   Cranial Nerves: II: Discs flat bilaterally; Visual fields grossly normal, pupils equal, round, reactive to light and accommodation III,IV, VI: ptosis not present, extra-ocular motions intact bilaterally V,VII: smile symmetric, facial light touch sensation normal bilaterally VIII: hearing normal bilaterally IX,X: gag reflex present XII: midline tongue extension Motor: Moves all extremities symmetrically Tone and bulk:normal tone throughout; no atrophy noted Sensory: unable to access  Deep Tendon Reflexes: 1+ and symmetric throughout Plantars: Right: downgoing   Left: downgoing Cerebellar: Not tested Gait: not tested       Laboratory Studies:   Basic Metabolic Panel:  Recent Labs Lab 11/03/16 2111 11/04/16 2153 11/05/16 0507 11/06/16 0644  NA 134* 133* 133* 135  K 3.1* 3.5 3.5 3.4*  CL 99* 102 101 102  CO2 26 23 25 26   GLUCOSE 154* 186* 144* 116*  BUN 31* 26* 21* 19  CREATININE 0.87 0.71 0.74 0.55  CALCIUM 9.6 8.9 8.9 9.1  Liver Function Tests:  Recent Labs Lab 11/03/16 2111 11/04/16 2153  AST 35 29  ALT 35 34  ALKPHOS 74 71  BILITOT 1.8* 1.5*  PROT 7.0 6.5  ALBUMIN 4.4 3.9    Recent Labs Lab 11/03/16 2111 11/04/16 2153  LIPASE <10* 14    Recent Labs Lab 11/04/16 2155  AMMONIA 20    CBC:  Recent Labs Lab 11/03/16 2111 11/04/16 2153 11/05/16 0507 11/06/16 0644  WBC 5.8 8.4 9.1 5.7  NEUTROABS 4.7 7.1*  --   --   HGB 14.1 13.3 13.2 13.2  HCT 38.9 37.2 36.6 36.6  MCV 87.0 87.8 87.7 87.6  PLT 197  177 179 173    Cardiac Enzymes:  Recent Labs Lab 11/03/16 2111 11/04/16 2153  TROPONINI <0.03 <0.03    BNP: Invalid input(s): POCBNP  CBG:  Recent Labs Lab 11/04/16 2229  GLUCAP 168*    Microbiology: No results found for this or any previous visit.  Coagulation Studies: No results for input(s): LABPROT, INR in the last 72 hours.  Urinalysis:   Recent Labs Lab 11/04/16 0313 11/04/16 2140  COLORURINE YELLOW* YELLOW*  LABSPEC 1.021 1.020  PHURINE 5.0 5.0  GLUCOSEU NEGATIVE 50*  HGBUR NEGATIVE SMALL*  BILIRUBINUR NEGATIVE NEGATIVE  KETONESUR 5* 5*  PROTEINUR NEGATIVE NEGATIVE  NITRITE NEGATIVE NEGATIVE  LEUKOCYTESUR NEGATIVE NEGATIVE    Lipid Panel:     Component Value Date/Time   CHOL 142 11/05/2016 0507   TRIG 74 11/05/2016 0507   HDL 52 11/05/2016 0507   CHOLHDL 2.7 11/05/2016 0507   VLDL 15 11/05/2016 0507   LDLCALC 75 11/05/2016 0507    HgbA1C:  Lab Results  Component Value Date   HGBA1C 5.8 (H) 11/05/2016    Urine Drug Screen:  No results found for: LABOPIA, COCAINSCRNUR, LABBENZ, AMPHETMU, THCU, LABBARB  Alcohol Level: No results for input(s): ETH in the last 168 hours.  Other results: EKG: normal EKG, normal sinus rhythm, unchanged from previous tracings.  Imaging: Ct Head Wo Contrast  Result Date: 11/04/2016 CLINICAL DATA:  76 y/o F; increased confusion today. Fall yesterday. History of breast cancer. EXAM: CT HEAD WITHOUT CONTRAST TECHNIQUE: Contiguous axial images were obtained from the base of the skull through the vertex without intravenous contrast. COMPARISON:  11/03/2016 CT of the head. FINDINGS: Brain: No evidence of acute infarction, hemorrhage, hydrocephalus, extra-axial collection or mass lesion/mass effect. Stable mild parenchymal volume loss. Vascular: Extensive calcific atherosclerosis of the cavernous and paraclinoid internal carotid arteries. Skull: Normal. Negative for fracture or focal lesion. Sinuses/Orbits: No acute  finding. Other: Right intra-ocular lens replacement. IMPRESSION: 1. No acute intracranial abnormality identified. 2. Stable mild parenchymal volume loss. Electronically Signed   By: Kristine Garbe M.D.   On: 11/04/2016 22:30   US Carotid Bilateral (at [facility] And Ap Only)  Result Date: 11/05/2016 CLINICAL DATA:  Weakness EXAM: BILATERAL CAROTID DUPLEX ULTRASOUND TECHNIQUE: Pearline Cables scale imaging, color Doppler and duplex ultrasound were performed of bilateral carotid and vertebral arteries in the neck. COMPARISON:  None. FINDINGS: Criteria: Quantification of carotid stenosis is based on velocity parameters that correlate the residual internal carotid diameter with NASCET-based stenosis levels, using the diameter of the distal internal carotid lumen as the denominator for stenosis measurement. The following velocity measurements were obtained: RIGHT ICA:  91 cm/sec CCA:  157 cm/sec SYSTOLIC ICA/CCA RATIO:  0.9 DIASTOLIC ICA/CCA RATIO:  2.1 ECA:  94 cm/sec LEFT ICA:  110 cm/sec CCA:  85 cm/sec SYSTOLIC ICA/CCA RATIO:  1.3 DIASTOLIC ICA/CCA RATIO:  1.9 ECA:  133 cm/sec RIGHT CAROTID ARTERY: Little if any plaque in the bulb. Low resistance internal carotid Doppler pattern. RIGHT VERTEBRAL ARTERY:  Antegrade. LEFT CAROTID ARTERY: Little if any plaque in the bulb. Low resistance internal carotid Doppler pattern. LEFT VERTEBRAL ARTERY:  Antegrade. IMPRESSION: Less than 50% stenosis in the right and left internal carotid arteries. Electronically Signed   By: Marybelle Killings M.D.   On: 11/05/2016 11:00   Dg Chest Port 1 View  Result Date: 11/04/2016 CLINICAL DATA:  Weakness and altered mental status. EXAM: PORTABLE CHEST 1 VIEW COMPARISON:  11/03/2016 FINDINGS: Right IJ Port-A-Cath unchanged. Lungs are adequately inflated without airspace consolidation or effusion. Cardiomediastinal silhouette is within normal. There is calcified plaque over the thoracic aorta. There are mild degenerative changes of the spine.  IMPRESSION: No acute cardiopulmonary disease. Aortic atherosclerosis. Electronically Signed   By: Marin Olp M.D.   On: 11/04/2016 22:39   Ct Venogram Head  Result Date: 11/05/2016 CLINICAL DATA:  Chronic headaches. History of breast cancer. Stroke and diabetes. EXAM: CT VENOGRAM HEAD TECHNIQUE: Postcontrast imaging only was obtained through the brain with multiplanar reconstruction. CONTRAST:  40mL ISOVUE-300 IOPAMIDOL (ISOVUE-300) INJECTION 61% COMPARISON:  CT head without contrast 11/04/2016. MRI head 02/09/2015 FINDINGS: Multiple enhancing lesions are present in the brain. There was no hemorrhage in these areas on yesterday's unenhanced study. These are most likely due to metastatic disease. There is minimal edema. No significant mass-effect or midline shift. 3 mm lesion left medial parietal lobe image 46 5 mm left frontal lesion axial image 41 4 mm enhancing lesion right parietal lobe axial image 41 4 mm enhancing lesion left lower parietal lobe axial image 34 3 mm enhancing lesion left posterior temporal lobe axial image 25 7 mm enhancing lesion right pons axial image 15 5 mm enhancing lesion anterior pons axial image 12 14 mm lesion left lateral cerebellum axial image 14 9 mm lesion left mid cerebellum axial image 14 7 mm lesion right inferior medial cerebellum axial image 4 Negative for hydrocephalus.  Generalized atrophy. No skull lesions identified. Visualized paranasal sinuses normal. No orbital mass lesion. Normal enhancement of the dural venous sinuses. No evidence of clot or obstruction. IMPRESSION: 10 enhancing lesions in the brain with minimal edema. These are most consistent with metastatic disease. Consider MRI of the brain without and with contrast for further evaluation. Negative for dural venous sinus thrombosis. Electronically Signed   By: Franchot Gallo M.D.   On: 11/05/2016 16:13     Assessment/Plan: 76 y.o. female female with past medical history significant for hypertension, CVA,  breast cancer diagnosed in May 2017 on chemotherapy multiple recent admissions for headaches and recently seen for disorientation.  Pt was seen out pt neurology office and was started on elavil which patient stopped as she felt "drunk".  Pt lives at home and usually able to drive and fully take care of herself.    - CTV ordered last night to make sure not signs of venous thrombus and pt received contrast - she has multiple enhancing lesions which appear metastatic given the history of breast CA - on decadron  - will add Keppra 500 BID as edema and mets could be a focus for seizures.   - appreciate oncology assistance.     Leotis Pain   11/06/2016, 11:47 AM

## 2016-11-06 NOTE — Progress Notes (Signed)
OT Cancellation Note  Patient Details Name: Erica Levy MRN: [id number] DOB: 1941/03/29   Cancelled Treatment:    Reason Eval/Treat Not Completed: Other (comment). Order received, chart reviewed. On initial attempt to see this am, pt had just received her breakfast tray and family requesting OT come at later time. On 2nd attempt to see, pt asleep after MRI. Family in the room report pt was given something to sedate her and requested OT come back after pt has had a chance to rest and wake up. Will re-attempt OT evaluation at later date/time as available.  Jeni Salles, MPH, MS, OTR/L ascom [phone] 11/06/16, 1:58 PM

## 2016-11-06 NOTE — Care Management CC44 (Signed)
Condition Code 44 Documentation Completed  Patient Details  Name: Erica Levy MRN: [id number] Date of Birth: April 19, 1941   Condition Code 44 given:  Yes Patient signature on Condition Code 44 notice:  Yes Documentation of 2 MD's agreement:  Yes Code 44 added to claim:  Yes    Shelbie Ammons, RN 11/06/2016, 9:18 AM

## 2016-11-06 NOTE — Progress Notes (Signed)
OT Cancellation Note  Patient Details Name: Erica Levy MRN: [id number] DOB: 11-Jan-1941   Cancelled Treatment:    Reason Eval/Treat Not Completed: Fatigue/lethargy limiting ability to participate. On 3rd attempt to see, pt still very lethargic after MRI. Family requested OT come back tomorrow morning to re-attempt OT evaluation.   Jeni Salles, MPH, MS, OTR/L ascom [phone] 11/06/16, 2:48 PM

## 2016-11-07 ENCOUNTER — Institutional Professional Consult (permissible substitution): Payer: Medicare Other | Admitting: Radiation Oncology

## 2016-11-07 LAB — GLUCOSE, CAPILLARY
Glucose-Capillary: 191 mg/dL — ABNORMAL HIGH (ref 65–99)
Glucose-Capillary: 164 mg/dL — ABNORMAL HIGH (ref 65–99)

## 2016-11-07 MED ORDER — DOCUSATE SODIUM 100 MG PO CAPS
100.0000 mg | ORAL_CAPSULE | Freq: Two times a day (BID) | ORAL | Status: DC
  Administered 2016-11-07 – 2016-11-09 (×5): 100 mg via ORAL
  Filled 2016-11-07 (×5): qty 1

## 2016-11-07 MED ORDER — LEVETIRACETAM 500 MG PO TABS
500.0000 mg | ORAL_TABLET | Freq: Two times a day (BID) | ORAL | Status: DC
  Administered 2016-11-07 – 2016-11-09 (×4): 500 mg via ORAL
  Filled 2016-11-07 (×4): qty 1

## 2016-11-07 MED ORDER — NYSTATIN 100000 UNIT/ML MT SUSP
5.0000 mL | Freq: Four times a day (QID) | OROMUCOSAL | Status: DC
  Administered 2016-11-07 – 2016-11-09 (×8): 500000 [IU] via OROMUCOSAL
  Filled 2016-11-07 (×8): qty 5

## 2016-11-07 MED ORDER — INSULIN ASPART 100 UNIT/ML ~~LOC~~ SOLN
0.0000 [IU] | Freq: Three times a day (TID) | SUBCUTANEOUS | Status: DC
  Administered 2016-11-07 – 2016-11-09 (×5): 2 [IU] via SUBCUTANEOUS
  Administered 2016-11-09: 12:00:00 5 [IU] via SUBCUTANEOUS
  Filled 2016-11-07 (×5): qty 2
  Filled 2016-11-07: qty 5

## 2016-11-07 MED ORDER — POTASSIUM CHLORIDE 20 MEQ/15ML (10%) PO SOLN
20.0000 meq | Freq: Once | ORAL | Status: AC
  Administered 2016-11-07: 20 meq via ORAL
  Filled 2016-11-07: qty 15

## 2016-11-07 MED ORDER — POLYETHYLENE GLYCOL 3350 17 G PO PACK
17.0000 g | PACK | Freq: Every day | ORAL | Status: DC
  Administered 2016-11-07 – 2016-11-09 (×3): 17 g via ORAL
  Filled 2016-11-07 (×3): qty 1

## 2016-11-07 MED ORDER — INSULIN ASPART 100 UNIT/ML ~~LOC~~ SOLN: 0.0000 [IU] | Freq: Every day | SUBCUTANEOUS | Status: DC

## 2016-11-07 NOTE — Consult Note (Signed)
NEW PATIENT EVALUATION  Name: Erica Levy  MRN: [id number]  Date:   11/04/2016     DOB: 1941-03-13   This 76 y.o. female patient presents to the clinic for initial evaluation of brain metastasis.  REFERRING PHYSICIAN: No ref. provider found  CHIEF COMPLAINT:  Chief Complaint  Patient presents with  . Altered Mental Status    DIAGNOSIS: The primary encounter diagnosis was Confusion. Diagnoses of Delirium, Acute encephalopathy, Weakness, Chronic headaches, Confused, and Brain metastasis ([place]) were also pertinent to this visit.   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 She presented to the emergency room with significant altered mental status MRI scan was performed showing widespread intracranial metastatic disease involving bilateral cerebral hemispheres brainstem and cerebellar hemispheres. Patient is been started on steroid therapy which has resulted in some improvement in cognition. She is seen today in her hospital room is surrounded by multiple family members. She states she is aware of who I am and where she is. She is seen today for consideration of palliative treatment to her whole brain.  PLANNED TREATMENT REGIMEN: Whole brain radiation therapy  PAST MEDICAL HISTORY:  has a past medical history of Anemia; Anemia due to chemotherapy; Anxiety; Cancer ([place]); Diabetes mellitus without complication ([place]); Gallbladder calculus; Generalized OA; GERD (gastroesophageal reflux disease); Hypercholesteremia; Hypertension; Hypothyroid; Migraine; Osteoporosis; Stroke [facility]); and TIA (transient ischemic attack).    PAST SURGICAL HISTORY:  Past Surgical History:  Procedure Laterality Date  .  BREAST BIOPSY Left    bx/clip-neg  . BREAST BIOPSY Left 12/15/2015   path pending/us and stereo bx  . CATARACT EXTRACTION    . DILATION AND CURETTAGE OF UTERUS    . ENDOSCOPIC RETROGRADE CHOLANGIOPANCREATOGRAPHY (ERCP) WITH PROPOFOL N/A 06/19/2016   Procedure: ENDOSCOPIC RETROGRADE CHOLANGIOPANCREATOGRAPHY (ERCP) WITH PROPOFOL;  Surgeon: Lucilla Lame, MD;  Location: [facility] ENDOSCOPY;  Service: Endoscopy;  Laterality: N/A;  . EYE SURGERY Right    Cataract Extraction with IOL  . PARTIAL MASTECTOMY WITH AXILLARY SENTINEL LYMPH NODE BIOPSY Left 01/13/2016   Procedure: PARTIAL MASTECTOMY;  Surgeon: Leonie Green, MD;  Location: [facility] ORS;  Service: General;  Laterality: Left;  . PORTACATH PLACEMENT Right 01/13/2016   Procedure: INSERTION PORT-A-CATH;  Surgeon: Leonie Green, MD;  Location: [facility] ORS;  Service: General;  Laterality: Right;  . RETINAL DETACHMENT SURGERY Right    Dr. Starling Manns, [facility]  . TONSILLECTOMY      FAMILY HISTORY: family history includes Breast cancer (age of onset: 72) in her cousin; Dementia in her father; Hypertension in her mother; Osteoarthritis in her mother.  SOCIAL HISTORY:  reports that she has never smoked. She has never used smokeless tobacco. She reports that she does not drink alcohol or use drugs.  ALLERGIES: Patient has no known allergies.  MEDICATIONS:  Current Facility-Administered Medications  Medication Dose Route Frequency Provider Last Rate Last Dose  . acetaminophen (TYLENOL) tablet 650 mg  650 mg Oral Q6H PRN Lance Coon, MD       Or  . acetaminophen (TYLENOL) suppository 650 mg  650 mg Rectal Q6H PRN Lance Coon, MD      . aspirin suppository 300 mg  300 mg Rectal Daily Harrie Foreman, MD   300 mg at 11/05/16 1221  Or  . aspirin tablet 325 mg  325 mg Oral Daily Harrie Foreman, MD   325 mg at 11/07/16 0857  . atorvastatin (LIPITOR) tablet 80 mg  80 mg Oral q1800 Lance Coon, MD   80 mg at 11/06/16 1806  . chlorhexidine (PERIDEX)  0.12 % solution 15 mL  15 mL Mouth Rinse BID Dustin Flock, MD   15 mL at 11/07/16 0857  . dexamethasone (DECADRON) injection 4 mg  4 mg Intravenous Q6H Lequita Asal, MD   4 mg at 11/07/16 0600  . enoxaparin (LOVENOX) injection 40 mg  40 mg Subcutaneous Q24H Lance Coon, MD   40 mg at 11/06/16 2055  . feeding supplement (ENSURE ENLIVE) (ENSURE ENLIVE) liquid 237 mL  237 mL Oral TID BM Dustin Flock, MD   237 mL at 11/07/16 0858  . levETIRAcetam (KEPPRA) 500 mg in sodium chloride 0.9 % 100 mL IVPB  500 mg Intravenous Q12H Leotis Pain, MD   500 mg at 11/07/16 0305  . levothyroxine (SYNTHROID, LEVOTHROID) tablet 50 mcg  50 mcg Oral QAC breakfast Lance Coon, MD   50 mcg at 11/07/16 0857  . MEDLINE mouth rinse  15 mL Mouth Rinse q12n4p Dustin Flock, MD   15 mL at 11/06/16 1600  . ondansetron (ZOFRAN) tablet 4 mg  4 mg Oral Q6H PRN Lance Coon, MD       Or  . ondansetron [facility]) injection 4 mg  4 mg Intravenous Q6H PRN Lance Coon, MD      . sodium chloride flush (NS) 0.9 % injection 3 mL  3 mL Intravenous Q12H Lance Coon, MD   3 mL at 11/07/16 0857    ECOG PERFORMANCE STATUS:  3 - Symptomatic, >50% confined to bed  REVIEW OF SYSTEMS: Except for the altered mental status Patient denies any weight loss, fatigue, weakness, fever, chills or night sweats. Patient denies any loss of vision, blurred vision. Patient denies any ringing  of the ears or hearing loss. No irregular heartbeat. Patient denies heart murmur or history of fainting. Patient denies any chest pain or pain radiating to her upper extremities. Patient denies any shortness of breath, difficulty breathing at night, cough or hemoptysis. Patient denies any swelling in the lower legs. Patient denies any nausea vomiting, vomiting of blood, or coffee ground material in the vomitus. Patient denies any stomach pain. Patient states has had normal bowel movements no significant constipation or diarrhea. Patient denies any dysuria,  hematuria or significant nocturia. Patient denies any problems walking, swelling in the joints or loss of balance. Patient denies any skin changes, loss of hair or loss of weight. Patient denies any excessive worrying or anxiety or significant depression. Patient denies any problems with insomnia. Patient denies excessive thirst, polyuria, polydipsia. Patient denies any swollen glands, patient denies easy bruising or easy bleeding. Patient denies any recent infections, allergies or URI. Patient "s visual fields have not changed significantly in recent time.    PHYSICAL EXAM: BP (!) 120/50 (BP Location: Right Arm)   Pulse 65   Temp 97.8 F (36.6 C)   Resp 18   Ht 5\' 2"  (1.575 m)   Wt 142 lb (64.4 kg)   SpO2 99%   BMI 25.97 kg/m  Cranial nerves II through XII appear grossly intact. Motor sensory and DTR levels are equal symmetric in upper lower extremities. Crude visual fields are within normal range. Proprioception is intact. Well-developed well-nourished patient in NAD. HEENT reveals PERLA, EOMI, discs not visualized.  Oral cavity  is clear. No oral mucosal lesions are identified. Neck is clear without evidence of cervical or supraclavicular adenopathy. Lungs are clear to A&P. Cardiac examination is essentially unremarkable with regular rate and rhythm without murmur rub or thrill. Abdomen is benign with no organomegaly or masses noted. Motor sensory and DTR levels are equal and symmetric in the upper and lower extremities. Cranial nerves II through XII are grossly intact. Proprioception is intact. No peripheral adenopathy or edema is identified. No motor or sensory levels are noted. Crude visual fields are within normal range.  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 I would  plan on delivering 3000 cGy in 10 fractions. Risks and benefits of treatment including hair loss fatigue alteration of blood counts possible cognitive decline all were discussed in detail with the patient and her family. They all seem to comprehend my treatment plan well. I have personally ordered and scheduled CT simulation early next week for the patient.  I would like to take this opportunity to thank you for allowing me to participate in the care of your patient.Armstead Peaks., MD

## 2016-11-07 NOTE — Progress Notes (Signed)
Palliative Medicine consult noted. Due to high referral volume, there may be a delay seeing this patient. Please call the Palliative Medicine Team office at [phone] if recommendations are needed in the interim.  Thank you for inviting [place] to see this patient.  Marjie Skiff Tekoa Amon, RN, BSN,  [facility] 11/07/2016 3:36 PM Cell [phone] 8:00-4:00 Monday-Friday Office [phone]

## 2016-11-07 NOTE — Plan of Care (Signed)
Problem: SLP Dysphagia Goals Goal: Misc Dysphagia Goal Pt will safely tolerate po diet of least restrictive consistency w/ no overt s/s of aspiration noted by Staff/pt/family x3 sessions.    

## 2016-11-07 NOTE — Progress Notes (Signed)
Physical Therapy Treatment Patient Details Name: Erica Levy MRN: [id number] DOB: 03-24-1941 Today's Date: 11/07/2016    History of Present Illness Pt is a 76 y.o. female presenting to hospital with worsening AMS (pt with recent ED visit for AMS), ongoing HA's, and multiple recent falls. Pt admitted with acute encephalopathy. CT head negative.  MRI indicates widespread intracranial metastatic disease bilaterally in cerebral hemispheres, brainstem, and bilaterally in cerebellar hemispheres.  PMH includes L breast CA, stroke, DM, htn, migraine, TIA, and partial L mastectomy.    PT Comments    Pt without UE and LE abnormal movements at rest today and was able to verbally communicate (although some confusion noted).  Pt also reporting double vision.  Pt able to stand with 2 assist and attempt marching in place.  Will attempt ambulation next session with RW to progress pt with functional mobility as able.    Follow Up Recommendations  SNF     Equipment Recommendations  Rolling walker with 5" wheels    Recommendations for Other Services       Precautions / Restrictions Precautions Precautions: Fall Precaution Comments: Port-a-cath Restrictions Weight Bearing Restrictions: No    Mobility  Bed Mobility Overal bed mobility: Needs Assistance Bed Mobility: Supine to Sit;Sit to Supine     Supine to sit: Mod assist;HOB elevated Sit to supine: Mod assist   General bed mobility comments: vc's for LE's and assist for trunk supine to sit plus increased time to perform; sit to supine assist for trunk and B LE's  Transfers Overall transfer level: Needs assistance Equipment used: 2 person hand held assist Transfers: Sit to/from Stand Sit to Stand: Min assist;Mod assist;+2 physical assistance         General transfer comment: vc's and tactile cues for hand and foot placement; assist to initiate stand; x3 trials  Ambulation/Gait Ambulation/Gait assistance: Min assist;Mod assist;+2  physical assistance Ambulation Distance (Feet):  (marching in place x3 reps B LE's) Assistive device: 2 person hand held assist   Gait velocity: decreased   General Gait Details: various step height with marching; opposite knee blocked; consistent vc's required for upright posture; vc's, tactile cues, and visual demo required   Stairs            Wheelchair Mobility    Modified Rankin (Stroke Patients Only)       Balance Overall balance assessment: Needs assistance Sitting-balance support: Feet supported;Bilateral upper extremity supported Sitting balance-Leahy Scale: Fair Sitting balance - Comments: static sitting SBA to CGA   Standing balance support: Bilateral upper extremity supported Standing balance-Leahy Scale: Poor Standing balance comment: 2 assist for safety in standing (unsteady in general; B hand hold assist)                            Cognition Arousal/Alertness: Awake/alert Behavior During Therapy: WFL for tasks assessed/performed Overall Cognitive Status: Impaired/Different from baseline Area of Impairment: Attention;Memory;Problem solving                             Problem Solving: Slow processing;Difficulty sequencing;Requires verbal cues;Decreased initiation General Comments: unable to identify youngest son with max cueing from family      Exercises      General Comments General comments (skin integrity, edema, etc.): bruising noted on BLE distal to knees, RN already aware (per OT noted).  Pt agreeable to PT session.      Pertinent Vitals/Pain Pain  Assessment: Faces Faces Pain Scale: Hurts a little bit Pain Location: sacral region (from fall) Pain Descriptors / Indicators: Sore Pain Intervention(s): Limited activity within patient's tolerance;Monitored during session;Repositioned  Vitals (HR and O2 on room air) stable and WFL throughout treatment session.    Home Living Family/patient expects to be discharged to::  Private residence Living Arrangements: Spouse/significant other Available Help at Discharge: Family Type of Home: Mobile home (double wide) Home Access: Stairs to enter Entrance Stairs-Rails: Right;Left Home Layout: One level Home Equipment: Environmental consultant - 2 wheels      Prior Function Level of Independence: Independent      Comments: indep with ADL, IADL (spouse does most of the cooking), driving (drives self to radiation appts)   PT Goals (current goals can now be found in the care plan section) Acute Rehab PT Goals Patient Stated Goal: to return to PLOF PT Goal Formulation: With family Time For Goal Achievement: Dec 02, 2016 Potential to Achieve Goals: Fair Additional Goals Additional Goal #1: Perform objective balance assessment. Progress towards PT goals: Progressing toward goals    Frequency    Min 2X/week      PT Plan Current plan remains appropriate    Co-evaluation             End of Session Equipment Utilized During Treatment: Gait belt Activity Tolerance: Patient tolerated treatment well Patient left: in bed;with call bell/phone within reach;with bed alarm set;with family/visitor present (Fall mats in place; bed in lowest position; B heels elevated via pillows) Nurse Communication: Mobility status;Precautions PT Visit Diagnosis: Unsteadiness on feet (R26.81);Other abnormalities of gait and mobility (R26.89);Repeated falls (R29.6);History of falling (Z91.81);Muscle weakness (generalized) (M62.81)     Time: 1610-1630 PT Time Calculation (min) (ACUTE ONLY): 20 min  Charges:  $Therapeutic Activity: 8-22 mins                    G CodesLeitha Bleak, PT 11/07/16, 5:31 PM [phone]

## 2016-11-07 NOTE — Progress Notes (Signed)
[place] at [facility]                                                                                                                                                                                  Patient Demographics   Erica Levy, is a 76 y.o. female, DOB - 1940-10-20, TFT:[id number]  Admit date - 11/04/2016   Admitting Physician Lance Coon, MD  Outpatient Primary MD for the patient is WHITE, [name], NP   LOS - 2  Subjective: Patient very weak  Denies any pain   Review of Systems:    CONSTITUTIONAL: No documented fever. Positive fatigue and weakness. No weight gain, no weight loss.  EYES: No blurry or double vision.  ENT: No tinnitus. No postnasal drip. No redness of the oropharynx.  RESPIRATORY: No cough, no wheeze, no hemoptysis. No dyspnea.  CARDIOVASCULAR: No chest pain. No orthopnea. No palpitations. No syncope.  GASTROINTESTINAL: No nausea, no vomiting or diarrhea. No abdominal pain. No melena or hematochezia.  GENITOURINARY:  No urgency. No frequency. No dysuria. No hematuria. No obstructive symptoms. No discharge. No pain. No significant abnormal bleeding ENDOCRINE: No polyuria or nocturia. No heat or cold intolerance.  HEMATOLOGY: No anemia. No bruising. No bleeding. No purpura. No petechiae INTEGUMENTARY: No rashes. No lesions.  MUSCULOSKELETAL: No arthritis. No swelling. No gout.  NEUROLOGIC: unsteady gait PSYCHIATRIC: No anxiety. No insomnia. No ADD.      Vitals:   Vitals:   11/06/16 2009 11/07/16 0008 11/07/16 0456 11/07/16 0841  BP: 128/69 131/62 (!) 116/44 (!) 120/50  Pulse: 92 91 71 65  Resp: 20 20 20 18   Temp: 97.9 F (36.6 C) 97.8 F (36.6 C) 97.8 F (36.6 C)   TempSrc: Oral Oral    SpO2: 97% 98% 99% 99%  Weight:      Height:        Wt Readings from Last 3 Encounters:  11/04/16 142 lb (64.4 kg)  11/03/16 142 lb (64.4 kg)  11/02/16 142 lb (64.4 kg)     Intake/Output Summary (Last 24 hours) at  11/07/16 1143 Last data filed at 11/06/16 1500  Gross per 24 hour  Intake              105 ml  Output                0 ml  Net              105 ml    Physical Exam:   GENERAL:  Pt awake HEAD, EYES, EARS, NOSE AND THROAT: Atraumatic, normocephalic. Extraocular muscles are intact. Pupils equal and reactive to light. Sclerae anicteric. No conjunctival injection. No  oro-pharyngeal erythema.  NECK: Supple. There is no jugular venous distention. No bruits, no lymphadenopathy, no thyromegaly.  HEART: Regular rate and rhythm,. No murmurs, no rubs, no clicks.  LUNGS: Clear to auscultation bilaterally. No rales or rhonchi. No wheezes.  ABDOMEN: Soft, flat, nontender, nondistended. Has good bowel sounds. No hepatosplenomegaly appreciated.  EXTREMITIES: No evidence of any cyanosis, clubbing, or peripheral edema.  +2 pedal and radial pulses bilaterally.  NEUROLOGIC: confused SKIN: Moist and warm with no rashes appreciated.  Psych:  Pt not anxious and depressed LN: No inguinal LN enlargement    Antibiotics   Anti-infectives    None      Medications   Scheduled Meds: . aspirin  300 mg Rectal Daily   Or  . aspirin  325 mg Oral Daily  . atorvastatin  80 mg Oral q1800  . chlorhexidine  15 mL Mouth Rinse BID  . dexamethasone  4 mg Intravenous Q6H  . enoxaparin (LOVENOX) injection  40 mg Subcutaneous Q24H  . feeding supplement (ENSURE ENLIVE)  237 mL Oral TID BM  . levETIRAcetam  500 mg Intravenous Q12H  . levothyroxine  50 mcg Oral QAC breakfast  . mouth rinse  15 mL Mouth Rinse q12n4p  . sodium chloride flush  3 mL Intravenous Q12H   Continuous Infusions: PRN Meds:.acetaminophen **OR** acetaminophen, ondansetron **OR** ondansetron (ZOFRAN) IV   Data Review:   Micro Results No results found for this or any previous visit (from the past 240 hour(s)).  Radiology Reports Dg Chest 1 View  Result Date: 11/03/2016 CLINICAL DATA:  Fall this morning. EXAM: CHEST 1 VIEW COMPARISON:   06/06/2016 FINDINGS: Right IJ Port-A-Cath unchanged. Lungs are adequately inflated without consolidation, effusion or pneumothorax. Cardiomediastinal silhouette is normal. Calcified plaque over the thoracic aorta. Mild degenerate change of the spine. IMPRESSION: No active disease. Electronically Signed   By: Marin Olp M.D.   On: 11/03/2016 21:13   Dg Pelvis 1-2 Views  Result Date: 11/03/2016 CLINICAL DATA:  Fall this morning.  Right hip pain. EXAM: PELVIS - 1-2 VIEW COMPARISON:  11/10/2015 and 06/28/2009 FINDINGS: There is no evidence of pelvic fracture or diastasis. No pelvic bone lesions are seen. Degenerative change of the spine. IMPRESSION: No acute findings. Electronically Signed   By: Marin Olp M.D.   On: 11/03/2016 21:16   Ct Head Wo Contrast  Result Date: 11/04/2016 CLINICAL DATA:  76 y/o F; increased confusion today. Fall yesterday. History of breast cancer. EXAM: CT HEAD WITHOUT CONTRAST TECHNIQUE: Contiguous axial images were obtained from the base of the skull through the vertex without intravenous contrast. COMPARISON:  11/03/2016 CT of the head. FINDINGS: Brain: No evidence of acute infarction, hemorrhage, hydrocephalus, extra-axial collection or mass lesion/mass effect. Stable mild parenchymal volume loss. Vascular: Extensive calcific atherosclerosis of the cavernous and paraclinoid internal carotid arteries. Skull: Normal. Negative for fracture or focal lesion. Sinuses/Orbits: No acute finding. Other: Right intra-ocular lens replacement. IMPRESSION: 1. No acute intracranial abnormality identified. 2. Stable mild parenchymal volume loss. Electronically Signed   By: Kristine Garbe M.D.   On: 11/04/2016 22:30   Ct Head Wo Contrast  Result Date: 11/03/2016 CLINICAL DATA:  Fall this morning with generalized weakness 1 week. Right-sided headache. Breast cancer. EXAM: CT HEAD WITHOUT CONTRAST TECHNIQUE: Contiguous axial images were obtained from the base of the skull through the  vertex without intravenous contrast. COMPARISON:  10/16/2016 and 02/09/2015 FINDINGS: Brain: Ventricles, cisterns and other CSF spaces are within normal. No mass, mass effect, shift of midline structures  or acute hemorrhage. No evidence of acute infarction. Minimal chronic ischemic microvascular disease. Vascular: Calcified atherosclerotic plaque over the cavernous segment of the internal carotid arteries. Skull: Within normal. Sinuses/Orbits: Within normal. Other: None. IMPRESSION: No acute intracranial findings. Mild chronic ischemic microvascular disease. Electronically Signed   By: Marin Olp M.D.   On: 11/03/2016 21:43   Ct Head Wo Contrast  Result Date: 10/16/2016 CLINICAL DATA:  Headache history of breast cancer EXAM: CT HEAD WITHOUT CONTRAST TECHNIQUE: Contiguous axial images were obtained from the base of the skull through the vertex without intravenous contrast. COMPARISON:  06/01/2015 FINDINGS: Brain: No acute territorial infarction, intracranial hemorrhage or extra-axial fluid collection is seen. There is no focal mass, mass effect or midline shift. There is mild cortical atrophy. The ventricles are non enlarged. Vascular: No hyperdense vessels.  Carotid artery calcifications. Skull: Normal. Negative for fracture or focal lesion. Sinuses/Orbits: No acute finding. Other: None IMPRESSION: No CT evidence for acute intracranial abnormality. Electronically Signed   By: Donavan Foil M.D.   On: 10/16/2016 23:27   Mr Jeri Cos YQ Contrast  Result Date: 11/06/2016 CLINICAL DATA:  Worsening confusion and falls. History of breast cancer. EXAM: MRI HEAD WITHOUT AND WITH CONTRAST TECHNIQUE: Multiplanar, multiecho pulse sequences of the brain and surrounding structures were obtained without and with intravenous contrast. CONTRAST:  43mL MULTIHANCE GADOBENATE DIMEGLUMINE 529 MG/ML IV SOLN COMPARISON:  Noncontrast CT head 11/04/2016. Postcontrast CT head 11/05/2016. FINDINGS: Brain: Widespread intracranial  metastatic lesions are seen involving both cerebellar hemispheres, the brainstem, and both cerebral hemispheres. Index lesion LEFT lateral superior cerebellum measures 10 x 17 x 14 mm. Widespread metastatic lesions are seen throughout both cerebellar hemispheres, including the cerebellar tonsils. There is enhancement of the vermian cerebellar folia, consistent with leptomeningeal spread of disease. Index lesion in the brainstem measures 6 mm, RIGHT upper pons. Lower pontine, LEFT pontine, and midbrain lesions are also observed. No medullary metastases. Multiple supratentorial metastatic lesions are seen, the largest of which is 5 mm, LEFT frontal operculum. Other supratentorial areas involved include the LEFT temporal, RIGHT frontal, BILATERAL parietal lobes. Asymmetric enhancement of the subdural space, along the superior sagittal sinus to the RIGHT of midline, representing early pachymeningeal disease. No acute stroke or hemorrhage. Proteinaceous fluid layers in both ventricles. The ventricles are enlarged, including the temporal horns, consistent with mild communicating hydrocephalus. No extra-axial fluid. No significant midline shift. Vascular: Normal flow voids. Skull and upper cervical spine: Normal marrow signal. Cervical spondylosis. No pathologic compression fracture in the upper cervical region. Sinuses/Orbits: Negative. Other: None. IMPRESSION: Widespread intracranial metastatic disease as described involving BILATERAL cerebral hemispheres, brainstem, and BILATERAL cerebellar hemispheres. 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 A call has been placed to the ordering provider. Electronically Signed   By: Staci Righter M.D.   On: 11/06/2016 12:47   US Carotid Bilateral (at [facility] Only)  Result Date: 11/05/2016 CLINICAL DATA:  Weakness EXAM: BILATERAL CAROTID DUPLEX ULTRASOUND TECHNIQUE: Pearline Cables scale imaging, color Doppler and duplex  ultrasound were performed of bilateral carotid and vertebral arteries in the neck. COMPARISON:  None. FINDINGS: Criteria: Quantification of carotid stenosis is based on velocity parameters that correlate the residual internal carotid diameter with NASCET-based stenosis levels, using the diameter of the distal internal carotid lumen as the denominator for stenosis measurement. The following velocity measurements were obtained: RIGHT ICA:  91 cm/sec CCA:  657 cm/sec SYSTOLIC ICA/CCA RATIO:  0.9 DIASTOLIC ICA/CCA RATIO:  2.1 ECA:  94  cm/sec LEFT ICA:  110 cm/sec CCA:  85 cm/sec SYSTOLIC ICA/CCA RATIO:  1.3 DIASTOLIC ICA/CCA RATIO:  1.9 ECA:  133 cm/sec RIGHT CAROTID ARTERY: Little if any plaque in the bulb. Low resistance internal carotid Doppler pattern. RIGHT VERTEBRAL ARTERY:  Antegrade. LEFT CAROTID ARTERY: Little if any plaque in the bulb. Low resistance internal carotid Doppler pattern. LEFT VERTEBRAL ARTERY:  Antegrade. IMPRESSION: Less than 50% stenosis in the right and left internal carotid arteries. Electronically Signed   By: Marybelle Killings M.D.   On: 11/05/2016 11:00   Dg Chest Port 1 View  Result Date: 11/04/2016 CLINICAL DATA:  Weakness and altered mental status. EXAM: PORTABLE CHEST 1 VIEW COMPARISON:  11/03/2016 FINDINGS: Right IJ Port-A-Cath unchanged. Lungs are adequately inflated without airspace consolidation or effusion. Cardiomediastinal silhouette is within normal. There is calcified plaque over the thoracic aorta. There are mild degenerative changes of the spine. IMPRESSION: No acute cardiopulmonary disease. Aortic atherosclerosis. Electronically Signed   By: Marin Olp M.D.   On: 11/04/2016 22:39   Ct Venogram Head  Result Date: 11/05/2016 CLINICAL DATA:  Chronic headaches. History of breast cancer. Stroke and diabetes. EXAM: CT VENOGRAM HEAD TECHNIQUE: Postcontrast imaging only was obtained through the brain with multiplanar reconstruction. CONTRAST:  52mL ISOVUE-300 IOPAMIDOL  (ISOVUE-300) INJECTION 61% COMPARISON:  CT head without contrast 11/04/2016. MRI head 02/09/2015 FINDINGS: Multiple enhancing lesions are present in the brain. There was no hemorrhage in these areas on yesterday's unenhanced study. These are most likely due to metastatic disease. There is minimal edema. No significant mass-effect or midline shift. 3 mm lesion left medial parietal lobe image 46 5 mm left frontal lesion axial image 41 4 mm enhancing lesion right parietal lobe axial image 41 4 mm enhancing lesion left lower parietal lobe axial image 34 3 mm enhancing lesion left posterior temporal lobe axial image 25 7 mm enhancing lesion right pons axial image 15 5 mm enhancing lesion anterior pons axial image 12 14 mm lesion left lateral cerebellum axial image 14 9 mm lesion left mid cerebellum axial image 14 7 mm lesion right inferior medial cerebellum axial image 4 Negative for hydrocephalus.  Generalized atrophy. No skull lesions identified. Visualized paranasal sinuses normal. No orbital mass lesion. Normal enhancement of the dural venous sinuses. No evidence of clot or obstruction. IMPRESSION: 10 enhancing lesions in the brain with minimal edema. These are most consistent with metastatic disease. Consider MRI of the brain without and with contrast for further evaluation. Negative for dural venous sinus thrombosis. Electronically Signed   By: Franchot Gallo M.D.   On: 11/05/2016 16:13     CBC  Recent Labs Lab 11/03/16 2111 11/04/16 2153 11/05/16 0507 11/06/16 0644  WBC 5.8 8.4 9.1 5.7  HGB 14.1 13.3 13.2 13.2  HCT 38.9 37.2 36.6 36.6  PLT 197 177 179 173  MCV 87.0 87.8 87.7 87.6  MCH 31.6 31.4 31.6 31.5  MCHC 36.3* 35.8 36.0 36.0  RDW 15.5* 15.8* 15.8* 16.1*  LYMPHSABS 0.6* 0.7*  --   --   MONOABS 0.6 0.6  --   --   EOSABS 0.0 0.0  --   --   BASOSABS 0.0 0.0  --   --     Chemistries   Recent Labs Lab 11/03/16 2111 11/04/16 2153 11/05/16 0507 11/06/16 0644  NA 134* 133* 133* 135   K 3.1* 3.5 3.5 3.4*  CL 99* 102 101 102  CO2 26 23 25 26   GLUCOSE 154* 186* 144* 116*  BUN  31* 26* 21* 19  CREATININE 0.87 0.71 0.74 0.55  CALCIUM 9.6 8.9 8.9 9.1  AST 35 29  --   --   ALT 35 34  --   --   ALKPHOS 74 71  --   --   BILITOT 1.8* 1.5*  --   --    ------------------------------------------------------------------------------------------------------------------ estimated creatinine clearance is 54.3 mL/min (by C-G formula based on SCr of 0.55 mg/dL). ------------------------------------------------------------------------------------------------------------------  Recent Labs  11/05/16 0507  HGBA1C 5.8*   ------------------------------------------------------------------------------------------------------------------  Recent Labs  11/05/16 0507  CHOL 142  HDL 52  LDLCALC 75  TRIG 74  CHOLHDL 2.7   ------------------------------------------------------------------------------------------------------------------ No results for input(s): TSH, T4TOTAL, T3FREE, THYROIDAB in the last 72 hours.  Invalid input(s): FREET3 ------------------------------------------------------------------------------------------------------------------  Recent Labs  11/05/16 1352  VITAMINB12 187    Coagulation profile No results for input(s): INR, PROTIME in the last 168 hours.  No results for input(s): DDIMER in the last 72 hours.  Cardiac Enzymes  Recent Labs Lab 11/03/16 2111 11/04/16 2153  TROPONINI <0.03 <0.03   ------------------------------------------------------------------------------------------------------------------ Invalid input(s): POCBNP    Assessment & Plan  Patient is a 76 year old admitted with acute encephalopathy 1.  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  Essential  HTN (hypertension) bp stable  5.   GERD (gastroesophageal reflux disease) not on any medication currently 6. Diabetes ([place]) ssi      Code Status Orders        Start     Ordered   11/05/16 0318  Do not attempt resuscitation (DNR)  Continuous    Question Answer Comment  In the event of cardiac or respiratory ARREST Do not call a "code blue"   In the event of cardiac or respiratory ARREST Do not perform Intubation, CPR, defibrillation or ACLS   In the event of cardiac or respiratory ARREST Use medication by any route, position, wound care, and other measures to relive pain and suffering. May use oxygen, suction and manual treatment of airway obstruction as needed for comfort.      11/05/16 0317    Code Status History    Date Active Date Inactive Code Status Order ID Comments User Context   06/20/2016  9:06 AM 06/22/2016  1:01 PM DNR [id number]  Demetrios Loll, MD Inpatient   06/17/2016  8:23 PM 06/20/2016  9:06 AM Full Code [id number]  Idelle Crouch, MD Inpatient   06/06/2016 12:18 PM 06/07/2016  5:40 PM Full Code [id number]  Lytle Butte, MD ED   01/13/2016  4:52 PM 01/14/2016  4:12 PM Full Code [id number]  Leonie Green, MD Inpatient   02/09/2015  2:48 PM 02/10/2015  8:32 PM Full Code [id number]  Dustin Flock, MD Inpatient           Consults  neuro   DVT Prophylaxis  Lovenox    Lab Results  Component Value Date   PLT 173 11/06/2016     Time Spent in minutes  35min  Greater than 50% of time spent in care coordination and counseling patient regarding the condition and plan of care.   Dustin Flock M.D on 11/07/2016 at 11:43 AM  Between 7am to 6pm - Pager - [phone]  After 6pm go to [email or web address] - [place]  [facility]   Office  [phone]

## 2016-11-07 NOTE — Evaluation (Signed)
Occupational Therapy Evaluation Patient Details Name: Erica Levy MRN: [id number] DOB: August 20, 1940 Today's Date: 11/07/2016    History of Present Illness Pt is a 76 y.o. female presenting to hospital with worsening AMS (pt with recent ED visit for AMS), ongoing HA's, and multiple recent falls. Pt admitted with acute encephalopathy. CT head negative. PMH includes L breast CA, stroke, DM, htn, migraine, TIA, and partial L mastectomy.   Clinical Impression   Pt seen for OT evaluation this date on 3rd attempt. MRI indicates widespread intracranial metastatic disease bilaterally in cerebral hemispheres, brainstem, and bilaterally in cerebellar hemispheres. Pt resting with no involuntary movement of extremities, attends when name is called, demonstrating overall improvement in functioning and cognition per son and daughter's report with the following exceptions: pt now reporting double vision which disappears with 1 eye closed (RN, MD aware), and initial difficulty identifying her son in the room. Pt presents with impairments in cognition (executive functioning, problem solving, initiation, sequencing), decreased strength/activity tolerance, impaired vision, pain in sacral region due to recent fall, and increased need for assist with self care tasks. Pt would benefit from skilled OT services to address noted impairments and functional deficits in order to maximize return to PLOF and minimize future falls risk. Recommend STR following hospital stay prior to return home.     Follow Up Recommendations  SNF    Equipment Recommendations  Toilet rise with handles;3 in 1 bedside commode    Recommendations for Other Services       Precautions / Restrictions Precautions Precautions: Fall Precaution Comments: Port-a-cath Restrictions Weight Bearing Restrictions: No      Mobility Bed Mobility Overal bed mobility: Needs Assistance Bed Mobility: Supine to Sit;Sit to Supine     Supine to sit: Mod  assist;HOB elevated Sit to supine: Mod assist   General bed mobility comments: assist for trunk and BLEs for initiation, continuation, sequencing and safety  Transfers                 General transfer comment: deferred due to safety, double vision, fatigue, and pain     Balance Overall balance assessment: Needs assistance Sitting-balance support: Feet supported;Single extremity supported Sitting balance-Leahy Scale: Fair Sitting balance - Comments: static sitting while performing oral care with 1 UE support leaning heavily to L side due to sacral pain (pt reports she fell right on her sacrum)                                   ADL either performed or assessed with clinical judgement   ADL Overall ADL's : Needs assistance/impaired Eating/Feeding: Cueing for sequencing;Sitting;Set up;Supervision/ safety Eating/Feeding Details (indicate cue type and reason): min guard and supervision for holding Ensure with straw and bringing to her mouth to sip with additional time to perform; daughter reports earlier pt able to scoop grits onto spoon but then got "stuck" and didn't know how to bring to her mouth Grooming: Oral care;Sitting;Minimal assistance;Cueing for sequencing Grooming Details (indicate cue type and reason): Pt sat EOB and was able to open toothbrush packaging, twist off toothpaste cap, min assist to set up/squeeze toothpaste onto brush and brush teeth with additional time for processing/initiation and verbal cues for sequencing         Upper Body Dressing : Minimal assistance;Sitting;Cueing for sequencing   Lower Body Dressing: Moderate assistance;Cueing for sequencing;Sitting/lateral leans     Toilet Transfer Details (indicate cue type and reason):  deferred due to safety           General ADL Comments: pt generally mod assist for LB ADL tasks, requiring verbal cues for sequencing, additional time to process     Vision Baseline Vision/History: Wears  glasses Wears Glasses: At all times Patient Visual Report: Diplopia (pt/daughter report started this morning) Vision Assessment?: Yes Eye Alignment: Within Functional Limits Ocular Range of Motion: Within Functional Limits Alignment/Gaze Preference: Within Defined Limits Tracking/Visual Pursuits: Other (comment) (decreased smoothness of tracking in all quads, verbal cues to continue) Visual Fields: No apparent deficits Diplopia Assessment: Disappears with one eye closed;Present in near gaze;Present in far gaze     Perception     Praxis Praxis Praxis tested?: Within functional limits    Pertinent Vitals/Pain Pain Assessment: Faces Faces Pain Scale: Hurts even more Pain Location: "backside - that's where I fell"; also reports headache earlier but not during time of evaluation Pain Descriptors / Indicators: Grimacing Pain Intervention(s): Limited activity within patient's tolerance;Monitored during session;Repositioned;Other (comment) (supported pt in lying on her side to relieve pressure on her sacrum)     Hand Dominance Right   Extremity/Trunk Assessment Upper Extremity Assessment Upper Extremity Assessment: Generalized weakness (>75% shoulder flexion ROM bilaterally, grossly 4/5 strength)   Lower Extremity Assessment Lower Extremity Assessment: Defer to PT evaluation   Cervical / Trunk Assessment Cervical / Trunk Assessment: Normal   Communication Communication Communication: Other (comment) (with additional time, and verbal cues pt able to communicate with OT appropriately)   Cognition Arousal/Alertness: Awake/alert Behavior During Therapy: WFL for tasks assessed/performed Overall Cognitive Status: Impaired/Different from baseline Area of Impairment: Attention;Memory;Problem solving                           Problem Solving: Slow processing;Difficulty sequencing;Requires verbal cues;Decreased initiation General Comments: initially misidentified her son, but  seemed to catch herself and with a little time and verbal cue, correctly identified son   General Comments  bruising noted on BLE distal to knees, RN already aware    Exercises     Shoulder Instructions      Home Living Family/patient expects to be discharged to:: Private residence Living Arrangements: Spouse/significant other Available Help at Discharge: Family Type of Home: Mobile home (double wide) Home Access: Stairs to enter Entrance Stairs-Number of Steps: 6 Entrance Stairs-Rails: Right;Left Home Layout: One level     Bathroom Shower/Tub: Tub/shower unit;Walk-in shower (historically uses tub/shower but not anytime recently - "don't like showers or getting in/out of the tub")   Bathroom Toilet: Standard Bathroom Accessibility: Yes How Accessible: Accessible via walker Home Equipment: Walker - 2 wheels          Prior Functioning/Environment Level of Independence: Independent        Comments: indep with ADL, IADL (spouse does most of the cooking), driving (drives self to radiation appts)        OT Problem List: Decreased strength;Decreased coordination;Pain;Decreased range of motion;Decreased cognition;Decreased safety awareness;Decreased activity tolerance;Impaired balance (sitting and/or standing);Decreased knowledge of use of DME or AE;Impaired vision/perception      OT Treatment/Interventions: Self-care/ADL training;Therapeutic exercise;Therapeutic activities;Energy conservation;DME and/or AE instruction;Patient/family education    OT Goals(Current goals can be found in the care plan section) Acute Rehab OT Goals Patient Stated Goal: to return to PLOF OT Goal Formulation: With patient/family Time For Goal Achievement: 13-Dec-2016 Potential to Achieve Goals: Fair  OT Frequency: Min 1X/week   Barriers to D/C:  Co-evaluation              End of Session    Activity Tolerance: Patient limited by fatigue;Patient limited by pain Patient left:  in bed;with call bell/phone within reach;with bed alarm set;with family/visitor present  OT Visit Diagnosis: Other abnormalities of gait and mobility (R26.89);Other symptoms and signs involving cognitive function;Pain;History of falling (Z91.81);Repeated falls (R29.6);Muscle weakness (generalized) (M62.81) Pain - part of body:  (sacrum)                Time: 1100-3496 OT Time Calculation (min): 46 min Charges:  OT General Charges $OT Visit: 1 Procedure OT Evaluation $OT Eval Low Complexity: 1 Procedure OT Treatments $Self Care/Home Management : 38-52 mins G-Codes:     Jeni Salles, MPH, MS, OTR/L ascom [phone] 11/07/16, 3:21 PM

## 2016-11-08 DIAGNOSIS — Z7189 Other specified counseling: Secondary | ICD-10-CM

## 2016-11-08 DIAGNOSIS — Z515 Encounter for palliative care: Secondary | ICD-10-CM

## 2016-11-08 DIAGNOSIS — R51 Headache: Secondary | ICD-10-CM

## 2016-11-08 DIAGNOSIS — R519 Headache, unspecified: Secondary | ICD-10-CM

## 2016-11-08 LAB — BASIC METABOLIC PANEL
Anion gap: 7 (ref 5–15)
BUN: 23 mg/dL — AB (ref 6–20)
CO2: 26 mmol/L (ref 22–32)
Calcium: 9.7 mg/dL (ref 8.9–10.3)
Chloride: 105 mmol/L (ref 101–111)
Creatinine, Ser: 0.66 mg/dL (ref 0.44–1.00)
GFR calc Af Amer: >60 mL/min (ref >60)
GLUCOSE: 178 mg/dL — AB (ref 65–99)
POTASSIUM: 3.8 mmol/L (ref 3.5–5.1)
Sodium: 138 mmol/L (ref 135–145)

## 2016-11-08 LAB — GLUCOSE, CAPILLARY
GLUCOSE-CAPILLARY: 188 mg/dL — AB (ref 65–99)
GLUCOSE-CAPILLARY: 189 mg/dL — AB (ref 65–99)
Glucose-Capillary: 173 mg/dL — ABNORMAL HIGH (ref 65–99)
Glucose-Capillary: 183 mg/dL — ABNORMAL HIGH (ref 65–99)

## 2016-11-08 MED ORDER — BOOST / RESOURCE BREEZE PO LIQD
1.0000 | Freq: Three times a day (TID) | ORAL | Status: DC
  Administered 2016-11-09: 1 via ORAL

## 2016-11-08 MED ORDER — LACTULOSE 10 GM/15ML PO SOLN
30.0000 g | Freq: Two times a day (BID) | ORAL | Status: DC
  Administered 2016-11-08 – 2016-11-09 (×3): 30 g via ORAL
  Filled 2016-11-08 (×3): qty 60

## 2016-11-08 MED ORDER — MAGNESIUM CITRATE PO SOLN
0.5000 | Freq: Once | ORAL | Status: AC
  Administered 2016-11-08: 0.5 via ORAL
  Filled 2016-11-08: qty 296

## 2016-11-08 NOTE — Consult Note (Signed)
Consultation Note Date: 11/08/2016   Patient Name: Erica Levy  DOB: 03-09-1941  MRN: [id number]  Age / Sex: 76 y.o., female  PCP: Ricardo Jericho, NP Referring Physician: Dustin Flock, MD  Reason for Consultation: Establishing goals of care  HPI/Patient Profile: 76 y.o. female  with past medical history of breast cancer, migraines, stroke, osteoporosis, hypothyroidism, hypertension, GERD, DM, anxiety, and anemia admitted on 11/04/2016 with altered mental status, frequent falls, headaches, and slurred speech. Initial workup for infection in ED was negative. CT head negative. CT venogram reveals 10 enhancing lesions in brain. MRI head reveals widespread intracranial metastatic disease involving bilateral cerebral and cerebellar hemispheres, brainstem, and pachymeningeal and leptomeningeal involvement. Oncology and radiation oncology following. Plan is for palliative whole brain radiation. Patient receiving decadron. Palliative medicine consultation for goals of care/hospice.   Clinical Assessment and Goals of Care: Initially met with patient and husband at bedside. Later in the afternoon, met again with patient and two daughters at bedside [facility] and [name]). Introduced palliative medicine. Patient and husband have been married for 45 years. They have 7 children and a large extended, supportive family. Prior to hospitalization, patient living home with husband. She has had headaches every day for six weeks. Erica Levy tells me her headaches are "worse than migraines" and "she knew it might be the cancer spreading." She recently finished radiation for breast cancer in March.   Discussed diagnoses and poor prognosis. Daughters at bedside seem to have a good understanding but state the patient, husband, and other family members are still trying to come to terms with the news of terminal cancer. Discussed plan for  palliative radiation being for comfort and NOT cure. They state understanding. She will begin radiation next week. Discussed options of SNF for rehab versus home with physical therapy. Erica Levy feels SNF for rehab is the best option and hopeful for her to "get her strength back" to the point she can walk back and forth to the bathroom. Explained my concern that as disease progresses, she will continue to become weaker and may not be able to work with physical therapy. Also, experienced double vision yesterday when working with PT and may continue to have these visual changes with metastatic cancer.   Patient and daughters confirm DNR/DNI, especially now knowing she has metastatic cancer. "Had a big conversation last night." When the time comes, they want "nature to take course" and for her to "be as comfortable as possible." Also discussed quality days for the time she has left--they understand they can opt to discontinue radiation if she does not tolerate it or chooses to stop.   Educated on palliative services to follow at [facility] on discharge. Daughter understands this can transition to hospice services when she returns home.   At this point, focus is palliative radiation starting next week and rehab likely tomorrow. The family speaks of experiences they want to share with her with the time they have left, including taking her to the beach after radiation.   Answered questions and concerns. Family  appreciative of palliative consultation.     SUMMARY OF RECOMMENDATIONS    DNR/DNI  Palliative radiation starting next week, Monday.   Likely discharge to rehab tomorrow, 4/13.  Family agreeable with palliative services to follow on discharge, understanding poor prognosis and transition to hospice once home.   Code Status/Advance Care Planning:  DNR   Symptom Management:   Per attending  Palliative Prophylaxis:   Aspiration, Delirium Protocol, Frequent Pain Assessment, Oral Care and Turn  Reposition  Psycho-social/Spiritual:   Desire for further Chaplaincy support:yes  Additional Recommendations: Caregiving  Support/Resources and Education on Hospice  Prognosis:   < 3 months  Discharge Planning: [place] for rehab with Palliative care service follow-up      Primary Diagnoses: Present on Admission: . Acute encephalopathy . Hypothyroidism . HTN (hypertension) . GERD (gastroesophageal reflux disease) . Altered mental state   I have reviewed the medical record, interviewed the patient and family, and examined the patient. The following aspects are pertinent.  Past Medical History:  Diagnosis Date  . Anemia   . Anemia due to chemotherapy   . Anxiety   . Cancer ([place])    breast  . Diabetes mellitus without complication ([place])    diet controlled  . Gallbladder calculus   . Generalized OA   . GERD (gastroesophageal reflux disease)   . Hypercholesteremia   . Hypertension   . Hypothyroid   . Migraine   . Osteoporosis   . Stroke ([place])   . TIA (transient ischemic attack)    July   Social History   Social History  . Marital status: Married    Spouse name: N/A  . Number of children: N/A  . Years of education: N/A   Social History Main Topics  . Smoking status: Never Smoker  . Smokeless tobacco: Never Used  . Alcohol use No  . Drug use: No  . Sexual activity: Not Currently   Other Topics Concern  . None   Social History Narrative  . None   Family History  Problem Relation Age of Onset  . Hypertension Mother   . Osteoarthritis Mother   . Dementia Father   . Breast cancer Cousin 27    mat cousin   Scheduled Meds: . aspirin  300 mg Rectal Daily   Or  . aspirin  325 mg Oral Daily  . atorvastatin  80 mg Oral q1800  . chlorhexidine  15 mL Mouth Rinse BID  . dexamethasone  4 mg Intravenous Q6H  . docusate sodium  100 mg Oral BID  . enoxaparin (LOVENOX) injection  40 mg Subcutaneous Q24H  . feeding supplement  1 Container  Oral TID BM  . insulin aspart  0-5 Units Subcutaneous QHS  . insulin aspart  0-9 Units Subcutaneous TID WC  . lactulose  30 g Oral BID  . levETIRAcetam  500 mg Oral BID  . levothyroxine  50 mcg Oral QAC breakfast  . mouth rinse  15 mL Mouth Rinse q12n4p  . nystatin  5 mL Mouth/Throat QID  . polyethylene glycol  17 g Oral Daily  . sodium chloride flush  3 mL Intravenous Q12H   Continuous Infusions: PRN Meds:.acetaminophen **OR** acetaminophen, ondansetron **OR** ondansetron (ZOFRAN) IV Medications Prior to Admission:  Prior to Admission medications   Medication Sig Start Date End Date Taking? Authorizing Provider  atorvastatin (LIPITOR) 80 MG tablet Take 80 mg by mouth daily at 6 PM.    Yes Historical Provider, MD  Cholecalciferol (VITAMIN D3) 5000 units  CAPS Take 1 capsule by mouth every other day.   Yes Historical Provider, MD  hydrochlorothiazide (MICROZIDE) 12.5 MG capsule Take 12.5 mg by mouth daily.   Yes Historical Provider, MD  HYDROcodone-acetaminophen (NORCO/VICODIN) 5-325 MG tablet 1-2 tabs po qd prn for pain/headache Patient taking differently: Take 1 tablet by mouth every 4 (four) hours as needed. pain/headache 10/06/16  Yes Norval Gable, MD  levothyroxine (SYNTHROID, LEVOTHROID) 50 MCG tablet Take 1 tablet by mouth daily. 01/08/16  Yes Historical Provider, MD  nortriptyline (PAMELOR) 10 MG capsule Take 10 mg by mouth at bedtime. 10/29/16  Yes Historical Provider, MD  ondansetron (ZOFRAN ODT) 4 MG disintegrating tablet Take 1 tablet (4 mg total) by mouth every 8 (eight) hours as needed for nausea or vomiting. 06/22/16  Yes Gladstone Lighter, MD  potassium chloride SA (K-DUR,KLOR-CON) 20 MEQ tablet Take 20 mEq by mouth 2 (two) times daily.   Yes Historical Provider, MD  predniSONE (DELTASONE) 10 MG tablet Take 10 mg by mouth daily with breakfast. Should be completed by 11-01-2016 10/23/16  Yes Historical Provider, MD   No Known Allergies Review of Systems  Constitutional: Positive  for activity change and fatigue.  Neurological: Positive for speech difficulty, weakness and headaches.   Physical Exam  Constitutional: She is cooperative.  HENT:  Head: Normocephalic and atraumatic.  Cardiovascular: Regular rhythm.   Pulmonary/Chest: Effort normal.  Abdominal: Normal appearance.  Neurological: She is alert.  Pleasantly confused  Skin: Skin is warm and dry. There is pallor.  Psychiatric: She has a normal mood and affect. Her behavior is normal. Her speech is delayed.  Nursing note and vitals reviewed.  Vital Signs: BP (!) 123/56 (BP Location: Right Arm)   Pulse 76   Temp 97.8 F (36.6 C) (Oral)   Resp 18   Ht _0  (1.575 m)   Wt 64.4 kg (142 lb)   SpO2 95%   BMI 25.97 kg/m  Pain Assessment: No/denies pain   Pain Score: Asleep  SpO2: SpO2: 95 % O2 Device:SpO2: 95 % O2 Flow Rate: .   IO: Intake/output summary:   Intake/Output Summary (Last 24 hours) at 11/08/16 2104 Last data filed at 11/08/16 1911  Gross per 24 hour  Intake              480 ml  Output                0 ml  Net              480 ml    LBM: Last BM Date: 11/04/16 Baseline Weight: Weight: 64.4 kg (142 lb) Most recent weight: Weight: 64.4 kg (142 lb)     Palliative Assessment/Data: PPS 40%   Flowsheet Rows     Most Recent Value  Intake Tab  Referral Department  Hospitalist  Unit at Time of Referral  Oncology Unit  Palliative Care Primary Diagnosis  Cancer  Palliative Care Type  New Palliative care  Reason for referral  Clarify Goals of Care, Counsel Regarding Hospice  Date of Admission  11/04/16  Date first seen by Palliative Care  11/08/16  Clinical Assessment  Palliative Performance Scale Score  40%  Psychosocial & Spiritual Assessment  Palliative Care Outcomes  Patient/Family meeting held?  Yes  Who was at the meeting?  patient, husband, two daughters, son  Palliative Care Outcomes  Clarified goals of care, Provided psychosocial or spiritual support, ACP counseling  assistance, Counseled regarding hospice, Linked to palliative care logitudinal support  Time In/Out: 0900-0930, 1510-1550 Time Total: 50mn Greater than 50%  of this time was spent counseling and coordinating care related to the above assessment and plan.  Signed by:  MIhor Dow FNP-C Palliative Medicine Team  Phone: [phone]: [phone]  Please contact Palliative Medicine Team phone at [phone] questions and concerns.  For individual provider: See AShea Evans

## 2016-11-08 NOTE — Progress Notes (Signed)
Nutrition Follow-up  DOCUMENTATION CODES:   Not applicable  INTERVENTION:  Recommend liberalizing diet from Heart Healthy to Regular in setting of ongoing poor PO intake.  Discontinued Ensure Enlive per patient request.  Provide Boost Breeze po TID, each supplement provides 250 kcal and 9 grams of protein.  NUTRITION DIAGNOSIS:   Inadequate oral intake related to lethargy/confusion as evidenced by per patient/family report.  Ongoing.  GOAL:   Patient will meet greater than or equal to 90% of their needs  Progressing.  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 Patient will begin palliative whole brain radiation therapy Monday. -Per chart plan is for patient to discharge to SNF tomorrow with palliative care following.  Patient more alert today than on initial assessment and was able to participate with answering questions. Several family members also at bedside. Patient reports she did fairly well with breakfast this morning but overall her appetite remains poor. She does not like the Ensure drinks and is amenable to trying Boost Breeze.   Meal Completion: 25-80% per chart Patient has had approximately 1068 kcal (75% minimum estimated kcal needs) and 29 grams of protein (39% minimum estimated protein needs) in the past 24 hrs.  Medications reviewed and include: dexamethasone 4 mg Q6hrs, Colace, Novolog sliding scale TID with meals and daily at bedtime, lactulose 30 grams BID, levothyroxine, Miralax.  Labs reviewed: CBG 164-191 past 24 hrs, BUN 23.  No subsequent weights from initial assessment to  trend.  Discussed with RN.  Diet Order:  Diet Heart Room service appropriate? Yes; Fluid consistency: Thin  Skin:  Reviewed, no issues  Last BM:  11/05/2016  Height:   Ht Readings from Last 1 Encounters:  11/04/16 5\' 2"  (1.575 m)    Weight:   Wt Readings from Last 1 Encounters:  11/04/16 142 lb (64.4 kg)    Ideal Body Weight:  50 kg  BMI:  Body mass index is 25.97 kg/m.  Estimated Nutritional Needs:   Kcal:  1430-1655 (MSJ x 1.3-1.5)  Protein:  75-95 grams (1.2-1.5 grams/kg)  Fluid:  1.6 L/day (25 ml/kg)  EDUCATION NEEDS:   No education needs identified at this time  Willey Blade, MS, RD, LDN Pager: [phone] After Hours Pager: [phone]

## 2016-11-08 NOTE — Progress Notes (Signed)
[place] at [facility]                                                                                                                                                                                  Patient Demographics   Erica Levy, is a 76 y.o. female, DOB - 1941/02/14, LFY:[id number]  Admit date - 11/04/2016   Admitting Physician Lance Coon, MD  Outpatient Primary MD for the patient is WHITE, [name], NP   LOS - 3  Subjective: Patient had blurred vision yesterday but otherwise denies any complaints  Review of Systems:    CONSTITUTIONAL: No documented fever. Positive fatigue and weakness. No weight gain, no weight loss.  EYES: No blurry orPositive yesterday double vision.  ENT: No tinnitus. No postnasal drip. No redness of the oropharynx.  RESPIRATORY: No cough, no wheeze, no hemoptysis. No dyspnea.  CARDIOVASCULAR: No chest pain. No orthopnea. No palpitations. No syncope.  GASTROINTESTINAL: No nausea, no vomiting or diarrhea. No abdominal pain. No melena or hematochezia.  GENITOURINARY:  No urgency. No frequency. No dysuria. No hematuria. No obstructive symptoms. No discharge. No pain. No significant abnormal bleeding ENDOCRINE: No polyuria or nocturia. No heat or cold intolerance.  HEMATOLOGY: No anemia. No bruising. No bleeding. No purpura. No petechiae INTEGUMENTARY: No rashes. No lesions.  MUSCULOSKELETAL: No arthritis. No swelling. No gout.  NEUROLOGIC: unsteady gait PSYCHIATRIC: No anxiety. No insomnia. No ADD.      Vitals:   Vitals:   11/07/16 0841 11/07/16 1857 11/07/16 2051 11/08/16 0300  BP: (!) 120/50 (!) 117/45 (!) 119/48 (!) 116/47  Pulse: 65 74 78 72  Resp: 18 18 20 18   Temp:  97.5 F (36.4 C) 97.8 F (36.6 C) 98.1 F (36.7 C)  TempSrc:  Oral Oral Oral  SpO2: 99% 99% 98% 99%  Weight:      Height:        Wt Readings from Last 3 Encounters:  11/04/16 142 lb (64.4 kg)  11/03/16 142 lb (64.4 kg)  11/02/16  142 lb (64.4 kg)     Intake/Output Summary (Last 24 hours) at 11/08/16 1144 Last data filed at 11/08/16 1028  Gross per 24 hour  Intake              480 ml  Output                0 ml  Net              480 ml    Physical Exam:   GENERAL:  Pt awake HEAD, EYES, EARS, NOSE AND THROAT: Atraumatic, normocephalic. Extraocular muscles are intact. Pupils equal and reactive to light. Sclerae  anicteric. No conjunctival injection. No oro-pharyngeal erythema.  NECK: Supple. There is no jugular venous distention. No bruits, no lymphadenopathy, no thyromegaly.  HEART: Regular rate and rhythm,. No murmurs, no rubs, no clicks.  LUNGS: Clear to auscultation bilaterally. No rales or rhonchi. No wheezes.  ABDOMEN: Soft, flat, nontender, nondistended. Has good bowel sounds. No hepatosplenomegaly appreciated.  EXTREMITIES: No evidence of any cyanosis, clubbing, or peripheral edema.  +2 pedal and radial pulses bilaterally.  NEUROLOGIC: confused SKIN: Moist and warm with no rashes appreciated.  Psych:  Pt not anxious and depressed LN: No inguinal LN enlargement    Antibiotics   Anti-infectives    None      Medications   Scheduled Meds: . aspirin  300 mg Rectal Daily   Or  . aspirin  325 mg Oral Daily  . atorvastatin  80 mg Oral q1800  . chlorhexidine  15 mL Mouth Rinse BID  . dexamethasone  4 mg Intravenous Q6H  . docusate sodium  100 mg Oral BID  . enoxaparin (LOVENOX) injection  40 mg Subcutaneous Q24H  . feeding supplement (ENSURE ENLIVE)  237 mL Oral TID BM  . insulin aspart  0-5 Units Subcutaneous QHS  . insulin aspart  0-9 Units Subcutaneous TID WC  . lactulose  30 g Oral BID  . levETIRAcetam  500 mg Oral BID  . levothyroxine  50 mcg Oral QAC breakfast  . magnesium citrate  0.5 Bottle Oral Once  . mouth rinse  15 mL Mouth Rinse q12n4p  . nystatin  5 mL Mouth/Throat QID  . polyethylene glycol  17 g Oral Daily  . sodium chloride flush  3 mL Intravenous Q12H   Continuous  Infusions: PRN Meds:.acetaminophen **OR** acetaminophen, ondansetron **OR** ondansetron (ZOFRAN) IV   Data Review:   Micro Results No results found for this or any previous visit (from the past 240 hour(s)).  Radiology Reports Dg Chest 1 View  Result Date: 11/03/2016 CLINICAL DATA:  Fall this morning. EXAM: CHEST 1 VIEW COMPARISON:  06/06/2016 FINDINGS: Right IJ Port-A-Cath unchanged. Lungs are adequately inflated without consolidation, effusion or pneumothorax. Cardiomediastinal silhouette is normal. Calcified plaque over the thoracic aorta. Mild degenerate change of the spine. IMPRESSION: No active disease. Electronically Signed   By: Marin Olp M.D.   On: 11/03/2016 21:13   Dg Pelvis 1-2 Views  Result Date: 11/03/2016 CLINICAL DATA:  Fall this morning.  Right hip pain. EXAM: PELVIS - 1-2 VIEW COMPARISON:  11/10/2015 and 06/28/2009 FINDINGS: There is no evidence of pelvic fracture or diastasis. No pelvic bone lesions are seen. Degenerative change of the spine. IMPRESSION: No acute findings. Electronically Signed   By: Marin Olp M.D.   On: 11/03/2016 21:16   Ct Head Wo Contrast  Result Date: 11/04/2016 CLINICAL DATA:  76 y/o F; increased confusion today. Fall yesterday. History of breast cancer. EXAM: CT HEAD WITHOUT CONTRAST TECHNIQUE: Contiguous axial images were obtained from the base of the skull through the vertex without intravenous contrast. COMPARISON:  11/03/2016 CT of the head. FINDINGS: Brain: No evidence of acute infarction, hemorrhage, hydrocephalus, extra-axial collection or mass lesion/mass effect. Stable mild parenchymal volume loss. Vascular: Extensive calcific atherosclerosis of the cavernous and paraclinoid internal carotid arteries. Skull: Normal. Negative for fracture or focal lesion. Sinuses/Orbits: No acute finding. Other: Right intra-ocular lens replacement. IMPRESSION: 1. No acute intracranial abnormality identified. 2. Stable mild parenchymal volume loss.  Electronically Signed   By: Kristine Garbe M.D.   On: 11/04/2016 22:30   Ct Head Wo  Contrast  Result Date: 11/03/2016 CLINICAL DATA:  Fall this morning with generalized weakness 1 week. Right-sided headache. Breast cancer. EXAM: CT HEAD WITHOUT CONTRAST TECHNIQUE: Contiguous axial images were obtained from the base of the skull through the vertex without intravenous contrast. COMPARISON:  10/16/2016 and 02/09/2015 FINDINGS: Brain: Ventricles, cisterns and other CSF spaces are within normal. No mass, mass effect, shift of midline structures or acute hemorrhage. No evidence of acute infarction. Minimal chronic ischemic microvascular disease. Vascular: Calcified atherosclerotic plaque over the cavernous segment of the internal carotid arteries. Skull: Within normal. Sinuses/Orbits: Within normal. Other: None. IMPRESSION: No acute intracranial findings. Mild chronic ischemic microvascular disease. Electronically Signed   By: Marin Olp M.D.   On: 11/03/2016 21:43   Ct Head Wo Contrast  Result Date: 10/16/2016 CLINICAL DATA:  Headache history of breast cancer EXAM: CT HEAD WITHOUT CONTRAST TECHNIQUE: Contiguous axial images were obtained from the base of the skull through the vertex without intravenous contrast. COMPARISON:  06/01/2015 FINDINGS: Brain: No acute territorial infarction, intracranial hemorrhage or extra-axial fluid collection is seen. There is no focal mass, mass effect or midline shift. There is mild cortical atrophy. The ventricles are non enlarged. Vascular: No hyperdense vessels.  Carotid artery calcifications. Skull: Normal. Negative for fracture or focal lesion. Sinuses/Orbits: No acute finding. Other: None IMPRESSION: No CT evidence for acute intracranial abnormality. Electronically Signed   By: Donavan Foil M.D.   On: 10/16/2016 23:27   Mr Jeri Cos GU Contrast  Result Date: 11/06/2016 CLINICAL DATA:  Worsening confusion and falls. History of breast cancer. EXAM: MRI HEAD  WITHOUT AND WITH CONTRAST TECHNIQUE: Multiplanar, multiecho pulse sequences of the brain and surrounding structures were obtained without and with intravenous contrast. CONTRAST:  68mL MULTIHANCE GADOBENATE DIMEGLUMINE 529 MG/ML IV SOLN COMPARISON:  Noncontrast CT head 11/04/2016. Postcontrast CT head 11/05/2016. FINDINGS: Brain: Widespread intracranial metastatic lesions are seen involving both cerebellar hemispheres, the brainstem, and both cerebral hemispheres. Index lesion LEFT lateral superior cerebellum measures 10 x 17 x 14 mm. Widespread metastatic lesions are seen throughout both cerebellar hemispheres, including the cerebellar tonsils. There is enhancement of the vermian cerebellar folia, consistent with leptomeningeal spread of disease. Index lesion in the brainstem measures 6 mm, RIGHT upper pons. Lower pontine, LEFT pontine, and midbrain lesions are also observed. No medullary metastases. Multiple supratentorial metastatic lesions are seen, the largest of which is 5 mm, LEFT frontal operculum. Other supratentorial areas involved include the LEFT temporal, RIGHT frontal, BILATERAL parietal lobes. Asymmetric enhancement of the subdural space, along the superior sagittal sinus to the RIGHT of midline, representing early pachymeningeal disease. No acute stroke or hemorrhage. Proteinaceous fluid layers in both ventricles. The ventricles are enlarged, including the temporal horns, consistent with mild communicating hydrocephalus. No extra-axial fluid. No significant midline shift. Vascular: Normal flow voids. Skull and upper cervical spine: Normal marrow signal. Cervical spondylosis. No pathologic compression fracture in the upper cervical region. Sinuses/Orbits: Negative. Other: None. IMPRESSION: Widespread intracranial metastatic disease as described involving BILATERAL cerebral hemispheres, brainstem, and BILATERAL cerebellar hemispheres. 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  Mild communicating hydrocephalus. A call has been placed to the ordering provider. Electronically Signed   By: Staci Righter M.D.   On: 11/06/2016 12:47   US Carotid Bilateral (at [facility] Only)  Result Date: 11/05/2016 CLINICAL DATA:  Weakness EXAM: BILATERAL CAROTID DUPLEX ULTRASOUND TECHNIQUE: Pearline Cables scale imaging, color Doppler and duplex ultrasound were performed of bilateral carotid and vertebral arteries in the neck. COMPARISON:  None. FINDINGS: Criteria: Quantification of carotid stenosis is based on velocity parameters that correlate the residual internal carotid diameter with NASCET-based stenosis levels, using the diameter of the distal internal carotid lumen as the denominator for stenosis measurement. The following velocity measurements were obtained: RIGHT ICA:  91 cm/sec CCA:  341 cm/sec SYSTOLIC ICA/CCA RATIO:  0.9 DIASTOLIC ICA/CCA RATIO:  2.1 ECA:  94 cm/sec LEFT ICA:  110 cm/sec CCA:  85 cm/sec SYSTOLIC ICA/CCA RATIO:  1.3 DIASTOLIC ICA/CCA RATIO:  1.9 ECA:  133 cm/sec RIGHT CAROTID ARTERY: Little if any plaque in the bulb. Low resistance internal carotid Doppler pattern. RIGHT VERTEBRAL ARTERY:  Antegrade. LEFT CAROTID ARTERY: Little if any plaque in the bulb. Low resistance internal carotid Doppler pattern. LEFT VERTEBRAL ARTERY:  Antegrade. IMPRESSION: Less than 50% stenosis in the right and left internal carotid arteries. Electronically Signed   By: Marybelle Killings M.D.   On: 11/05/2016 11:00   Dg Chest Port 1 View  Result Date: 11/04/2016 CLINICAL DATA:  Weakness and altered mental status. EXAM: PORTABLE CHEST 1 VIEW COMPARISON:  11/03/2016 FINDINGS: Right IJ Port-A-Cath unchanged. Lungs are adequately inflated without airspace consolidation or effusion. Cardiomediastinal silhouette is within normal. There is calcified plaque over the thoracic aorta. There are mild degenerative changes of the spine. IMPRESSION: No acute cardiopulmonary disease. Aortic atherosclerosis. Electronically  Signed   By: Marin Olp M.D.   On: 11/04/2016 22:39   Ct Venogram Head  Result Date: 11/05/2016 CLINICAL DATA:  Chronic headaches. History of breast cancer. Stroke and diabetes. EXAM: CT VENOGRAM HEAD TECHNIQUE: Postcontrast imaging only was obtained through the brain with multiplanar reconstruction. CONTRAST:  39mL ISOVUE-300 IOPAMIDOL (ISOVUE-300) INJECTION 61% COMPARISON:  CT head without contrast 11/04/2016. MRI head 02/09/2015 FINDINGS: Multiple enhancing lesions are present in the brain. There was no hemorrhage in these areas on yesterday's unenhanced study. These are most likely due to metastatic disease. There is minimal edema. No significant mass-effect or midline shift. 3 mm lesion left medial parietal lobe image 46 5 mm left frontal lesion axial image 41 4 mm enhancing lesion right parietal lobe axial image 41 4 mm enhancing lesion left lower parietal lobe axial image 34 3 mm enhancing lesion left posterior temporal lobe axial image 25 7 mm enhancing lesion right pons axial image 15 5 mm enhancing lesion anterior pons axial image 12 14 mm lesion left lateral cerebellum axial image 14 9 mm lesion left mid cerebellum axial image 14 7 mm lesion right inferior medial cerebellum axial image 4 Negative for hydrocephalus.  Generalized atrophy. No skull lesions identified. Visualized paranasal sinuses normal. No orbital mass lesion. Normal enhancement of the dural venous sinuses. No evidence of clot or obstruction. IMPRESSION: 10 enhancing lesions in the brain with minimal edema. These are most consistent with metastatic disease. Consider MRI of the brain without and with contrast for further evaluation. Negative for dural venous sinus thrombosis. Electronically Signed   By: Franchot Gallo M.D.   On: 11/05/2016 16:13     CBC  Recent Labs Lab 11/03/16 2111 11/04/16 2153 11/05/16 0507 11/06/16 0644  WBC 5.8 8.4 9.1 5.7  HGB 14.1 13.3 13.2 13.2  HCT 38.9 37.2 36.6 36.6  PLT 197 177 179 173  MCV  87.0 87.8 87.7 87.6  MCH 31.6 31.4 31.6 31.5  MCHC 36.3* 35.8 36.0 36.0  RDW 15.5* 15.8* 15.8* 16.1*  LYMPHSABS 0.6* 0.7*  --   --   MONOABS 0.6 0.6  --   --   EOSABS 0.0  0.0  --   --   BASOSABS 0.0 0.0  --   --     Chemistries   Recent Labs Lab 11/03/16 2111 11/04/16 2153 11/05/16 0507 11/06/16 0644 11/08/16 0443  NA 134* 133* 133* 135 138  K 3.1* 3.5 3.5 3.4* 3.8  CL 99* 102 101 102 105  CO2 26 23 25 26 26   GLUCOSE 154* 186* 144* 116* 178*  BUN 31* 26* 21* 19 23*  CREATININE 0.87 0.71 0.74 0.55 0.66  CALCIUM 9.6 8.9 8.9 9.1 9.7  AST 35 29  --   --   --   ALT 35 34  --   --   --   ALKPHOS 74 71  --   --   --   BILITOT 1.8* 1.5*  --   --   --    ------------------------------------------------------------------------------------------------------------------ estimated creatinine clearance is 54.3 mL/min (by C-G formula based on SCr of 0.66 mg/dL). ------------------------------------------------------------------------------------------------------------------ No results for input(s): HGBA1C in the last 72 hours. ------------------------------------------------------------------------------------------------------------------ No results for input(s): CHOL, HDL, LDLCALC, TRIG, CHOLHDL, LDLDIRECT in the last 72 hours. ------------------------------------------------------------------------------------------------------------------ No results for input(s): TSH, T4TOTAL, T3FREE, THYROIDAB in the last 72 hours.  Invalid input(s): FREET3 ------------------------------------------------------------------------------------------------------------------  Recent Labs  11/05/16 1352  VITAMINB12 187    Coagulation profile No results for input(s): INR, PROTIME in the last 168 hours.  No results for input(s): DDIMER in the last 72 hours.  Cardiac Enzymes  Recent Labs Lab 11/03/16 2111 11/04/16 2153  TROPONINI <0.03 <0.03    ------------------------------------------------------------------------------------------------------------------ Invalid input(s): POCBNP    Assessment & Plan  Patient is a 76 year old admitted with acute encephalopathy 1.  Acute encephalopathy Due to brain metastases From breast cancer Continue Decadron, radiation therapy per radiation oncology starts on  Monday 2. 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  Essential  HTN (hypertension) bp stable  5.   GERD (gastroesophageal reflux disease) not on any medication currently 6. Diabetes ([place]) ssi  Disposition patient and family agreeable for her to go to skilled nursing facility tomorrow with palliative care following     Code Status Orders        Start     Ordered   11/05/16 0318  Do not attempt resuscitation (DNR)  Continuous    Question Answer Comment  In the event of cardiac or respiratory ARREST Do not call a "code blue"   In the event of cardiac or respiratory ARREST Do not perform Intubation, CPR, defibrillation or ACLS   In the event of cardiac or respiratory ARREST Use medication by any route, position, wound care, and other measures to relive pain and suffering. May use oxygen, suction and manual treatment of airway obstruction as needed for comfort.      11/05/16 0317    Code Status History    Date Active Date Inactive Code Status Order ID Comments User Context   06/20/2016  9:06 AM 06/22/2016  1:01 PM DNR [id number]  Demetrios Loll, MD Inpatient   06/17/2016  8:23 PM 06/20/2016  9:06 AM Full Code [id number]  Idelle Crouch, MD Inpatient   06/06/2016 12:18 PM 06/07/2016  5:40 PM Full Code [id number]  Lytle Butte, MD ED   01/13/2016  4:52 PM 01/14/2016  4:12 PM Full Code [id number]  Leonie Green, MD Inpatient   02/09/2015  2:48 PM 02/10/2015  8:32 PM Full Code [id number]  Dustin Flock,  MD Inpatient            Consults  neuro   DVT Prophylaxis  Lovenox    Lab Results  Component Value Date   PLT 173 11/06/2016     Time Spent in minutes  55min  Greater than 50% of time spent in care coordination and counseling patient regarding the condition and plan of care.   Dustin Flock M.D on 11/08/2016 at 11:44 AM  Between 7am to 6pm - Pager - [phone]  After 6pm go to [email or web address] - password [email or web address] [facility]   Office  [phone]

## 2016-11-08 NOTE — NC FL2 (Signed)
[place] LEVEL OF CARE SCREENING TOOL     IDENTIFICATION  Patient Name: Erica Levy Birthdate: 1940/09/19 Sex: female Admission Date (Current Location): 11/04/2016  [place] and [place] Number:  Engineering geologist and Address:  [facility], [address] [place], [place] [address]      Provider Number: [id number]  Attending Physician Name and Address:  Dustin Flock, MD  Relative Name and Phone Number:       Current Level of Care: Hospital Recommended Level of Care: [place] Prior Approval Number:    Date Approved/Denied:   PASRR Number:  [id number])  Discharge Plan: SNF    Current Diagnoses: Patient Active Problem List   Diagnosis Date Noted  . Brain metastasis ([place]) 11/06/2016  . Acute encephalopathy 11/05/2016  . Hypothyroidism 11/05/2016  . HTN (hypertension) 11/05/2016  . GERD (gastroesophageal reflux disease) 11/05/2016  . Diabetes ([place]) 11/05/2016  . Altered mental state 11/05/2016  . Goals of care, counseling/discussion 10/14/2016  . Bilateral upper abdominal pain 06/17/2016  . Cholecystitis, acute 06/17/2016  . Elevated troponin 06/17/2016  . Acute cholecystitis 06/17/2016  . Chest pain 06/06/2016  . Breast cancer of upper-inner quadrant of left female breast ([place]) 01/06/2016  . CVA (cerebral infarction) 02/09/2015    Orientation RESPIRATION BLADDER Height & Weight     Self, Time, Situation, Place  Normal Continent Weight: 142 lb (64.4 kg) Height:  5\' 2"  (157.5 cm)  BEHAVIORAL SYMPTOMS/MOOD NEUROLOGICAL BOWEL NUTRITION STATUS   (None.)  (None. ) Continent Diet (Diet: Heart)  AMBULATORY STATUS COMMUNICATION OF NEEDS Skin   Extensive Assist Verbally Normal                       Personal Care Assistance Level of Assistance  Bathing, Feeding, Dressing Bathing Assistance: Limited assistance Feeding assistance: Independent Dressing Assistance: Limited assistance     Functional  Limitations Info  Sight, Hearing, Speech Sight Info: Adequate Hearing Info: Adequate Speech Info: Adequate    SPECIAL CARE FACTORS FREQUENCY  PT (By licensed PT), OT (By licensed OT)     PT Frequency:  (5) OT Frequency:  (5)            Contractures      Additional Factors Info  Code Status, Allergies, Insulin Sliding Scale Code Status Info:  (DNR) Allergies Info:  (No Known Allergies)   Insulin Sliding Scale Info:  (NovoLog)       Current Medications (11/08/2016):  This is the current hospital active medication list Current Facility-Administered Medications  Medication Dose Route Frequency Provider Last Rate Last Dose  . acetaminophen (TYLENOL) tablet 650 mg  650 mg Oral Q6H PRN Lance Coon, MD   650 mg at 11/08/16 0537   Or  . acetaminophen (TYLENOL) suppository 650 mg  650 mg Rectal Q6H PRN Lance Coon, MD      . aspirin suppository 300 mg  300 mg Rectal Daily Harrie Foreman, MD   300 mg at 11/05/16 1221   Or  . aspirin tablet 325 mg  325 mg Oral Daily Harrie Foreman, MD   325 mg at 11/08/16 1014  . atorvastatin (LIPITOR) tablet 80 mg  80 mg Oral q1800 Lance Coon, MD   80 mg at 11/07/16 1746  . chlorhexidine (PERIDEX) 0.12 % solution 15 mL  15 mL Mouth Rinse BID Dustin Flock, MD   15 mL at 11/08/16 1013  . dexamethasone (DECADRON) injection 4 mg  4 mg Intravenous Q6H  Lequita Asal, MD   4 mg at 11/08/16 0529  . docusate sodium (COLACE) capsule 100 mg  100 mg Oral BID Dustin Flock, MD   100 mg at 11/08/16 1014  . enoxaparin (LOVENOX) injection 40 mg  40 mg Subcutaneous Q24H Lance Coon, MD   40 mg at 11/07/16 2100  . feeding supplement (ENSURE ENLIVE) (ENSURE ENLIVE) liquid 237 mL  237 mL Oral TID BM Dustin Flock, MD   237 mL at 11/07/16 1358  . insulin aspart (novoLOG) injection 0-5 Units  0-5 Units Subcutaneous QHS Dustin Flock, MD      . insulin aspart (novoLOG) injection 0-9 Units  0-9 Units Subcutaneous TID WC Dustin Flock, MD   2 Units at  11/08/16 0753  . lactulose (CHRONULAC) 10 GM/15ML solution 30 g  30 g Oral BID Dustin Flock, MD      . levETIRAcetam (KEPPRA) tablet 500 mg  500 mg Oral BID Dustin Flock, MD   500 mg at 11/08/16 1014  . levothyroxine (SYNTHROID, LEVOTHROID) tablet 50 mcg  50 mcg Oral QAC breakfast Lance Coon, MD   50 mcg at 11/08/16 0753  . magnesium citrate solution 0.5 Bottle  0.5 Bottle Oral Once Dustin Flock, MD      . MEDLINE mouth rinse  15 mL Mouth Rinse q12n4p Dustin Flock, MD   15 mL at 11/07/16 1550  . nystatin (MYCOSTATIN) 100000 UNIT/ML suspension 500,000 Units  5 mL Mouth/Throat QID Dustin Flock, MD   500,000 Units at 11/08/16 1013  . ondansetron (ZOFRAN) tablet 4 mg  4 mg Oral Q6H PRN Lance Coon, MD       Or  . ondansetron [facility]) injection 4 mg  4 mg Intravenous Q6H PRN Lance Coon, MD      . polyethylene glycol (MIRALAX / GLYCOLAX) packet 17 g  17 g Oral Daily Dustin Flock, MD   17 g at 11/08/16 1013  . sodium chloride flush (NS) 0.9 % injection 3 mL  3 mL Intravenous Q12H Lance Coon, MD   3 mL at 11/07/16 2100     Discharge Medications: Please see discharge summary for a list of discharge medications.  Relevant Imaging Results:  Relevant Lab Results:   Additional Information  (SSN: [id number])  Danie Chandler, Student-Social Work

## 2016-11-08 NOTE — Progress Notes (Signed)
Speech Therapy Note: reviewed chart notes; consulted pt and family in room w/ pt this morning. Pt awake, verbally conversive and appeared much more alert and cognizant than at evaluation(BSE). Pt stated she was beginning to eat some "now". Noted Dietitian's note indicating a regular diet w/ supplements. Encouraged pt and family to think of any foods (favorites) including restaurant foods d/t the enhanced taste and pleasure of such and have those foods initially to entice desire for oral intake. Briefly reviewed general aspiration precautions.  No further skilled ST needs indicated at this time as both pt and family members deny any overt s/s of dysphagia/aspiration during oral intake. NSG updated and will reconsult ST services if any needs while admitted. Pt agreed.   Orinda Kenner, [place], CCC-SLP

## 2016-11-08 NOTE — Progress Notes (Signed)
New referral for Palliative services at [facility] following discharge received from [place]. Patient has received Palliative Consult at [facility]. Referral made aware. Thank you. Flo Shanks RN, BSN, [place] [facility] [place], [facility] [phone] c

## 2016-11-08 NOTE — Progress Notes (Signed)
Occupational Therapy Treatment Patient Details Name: Erica Levy MRN: [id number] DOB: [birth year] Today's Date: 11/08/2016    History of present illness Pt is a 76 y.o. female presenting to hospital with worsening AMS (pt with recent ED visit for AMS), ongoing HA's, and multiple recent falls. Pt admitted with acute encephalopathy. CT head negative.  MRI indicates widespread intracranial metastatic disease bilaterally in cerebral hemispheres, brainstem, and bilaterally in cerebellar hemispheres.  PMH includes L breast CA, stroke, DM, htn, migraine, TIA, and partial L mastectomy.   OT comments  Pt. Is a 76 y.o. 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 Pt. continues to require extensive step-by-step cues for task initiation, and sequencing during light ADL tasks at the EOB. Pt. requires assist for holding, and tipping the cup to her mouth. Pt. has strong family support. Pt. Continues to benefit from skilled OT services to improve ADL functioning, maximize ADL performance, and provide pt. Caregiver education. Pt. Could benefit from SNF level of care upon discharge. Pt. Could benefit from follow-up OT services. Pt. to have a palliative consult.   Follow Up Recommendations  SNF    Equipment Recommendations  Toilet rise with handles;3 in 1 bedside commode    Recommendations for Other Services      Precautions / Restrictions Restrictions Weight Bearing Restrictions: No       Mobility Bed Mobility Overal bed mobility: Needs Assistance Bed Mobility: Supine to Sit     Supine to sit: Min assist Sit to supine: Mod assist      Transfers       Sit to Stand: Min assist;+2 physical assistance              Balance                                           ADL either performed or assessed with clinical judgement   ADL Overall ADL's : Needs assistance/impaired Eating/Feeding: Set up;Minimal  assistance (MinA for drinking from a cup.)   Grooming: Oral care;Minimal assistance;Cueing for compensatory techniques Grooming Details (indicate cue type and reason): Pt. requires cues for task initiation, and to end squeezing toothpaste onto toothbrush.  Pt. requires verbal cues, and tactile cues to for each step of the task.             Lower Body Dressing: Moderate assistance                       Vision Baseline Vision/History: Wears glasses Wears Glasses: At all times Patient Visual Report:  (pt. reports Diplopia is better today.)     Perception     Praxis      Cognition Arousal/Alertness: Awake/alert Behavior During Therapy: WFL for tasks assessed/performed Overall Cognitive Status: Within Functional Limits for tasks assessed Area of Impairment: Attention;Memory;Problem solving                             Problem Solving: Slow processing;Difficulty sequencing;Requires verbal cues;Decreased initiation          Exercises     Shoulder Instructions       General Comments      Pertinent Vitals/ Pain       Pain Assessment: 0-10  Home Living  Prior Functioning/Environment              Frequency  Min 1X/week        Progress Toward Goals  OT Goals(current goals can now be found in the care plan section)  Progress towards OT goals: Progressing toward goals  Acute Rehab OT Goals Patient Stated Goal: To return home OT Goal Formulation: With patient/family Potential to Achieve Goals: [place] Discharge plan remains appropriate    Co-evaluation                 End of Session Equipment Utilized During Treatment: Gait belt  OT Visit Diagnosis: Muscle weakness (generalized) (M62.81);Feeding difficulties (R63.3);Cognitive communication deficit (R41.841)   Activity Tolerance Patient tolerated treatment well   Patient Left in bed;with call bell/phone within  reach;with bed alarm set;with family/visitor present   Nurse Communication          Time: 0925-1010 OT Time Calculation (min): 45 min  Charges: OT General Charges $OT Visit: 1 Procedure OT Treatments $Self Care/Home Management : 38-52 mins  Harrel Carina, MS, OTR/L  Harrel Carina, MS, OTR/L 11/08/2016, 11:26 AM

## 2016-11-09 ENCOUNTER — Ambulatory Visit: Admission: RE | Admit: 2016-11-09 | Payer: Medicare Other | Source: Ambulatory Visit | Admitting: Surgery

## 2016-11-09 LAB — GLUCOSE, CAPILLARY
Glucose-Capillary: 176 mg/dL — ABNORMAL HIGH (ref 65–99)
Glucose-Capillary: 272 mg/dL — ABNORMAL HIGH (ref 65–99)

## 2016-11-09 SURGERY — LAPAROSCOPIC CHOLECYSTECTOMY WITH INTRAOPERATIVE CHOLANGIOGRAM
Anesthesia: Choice

## 2016-11-09 MED ORDER — POLYETHYLENE GLYCOL 3350 17 G PO PACK: 17.0000 g | PACK | Freq: Every day | ORAL | 0 refills | Status: AC

## 2016-11-09 MED ORDER — FLEET ENEMA 7-19 GM/118ML RE ENEM
1.0000 | ENEMA | Freq: Once | RECTAL | Status: AC
Start: 2016-11-09 — End: 2016-11-09
  Administered 2016-11-09: 11:00:00 1 via RECTAL

## 2016-11-09 MED ORDER — NYSTATIN 100000 UNIT/ML MT SUSP: 5.0000 mL | Freq: Four times a day (QID) | OROMUCOSAL | 0 refills | Status: AC

## 2016-11-09 MED ORDER — DEXAMETHASONE 4 MG PO TABS: 4.0000 mg | ORAL_TABLET | Freq: Two times a day (BID) | ORAL | 0 refills | Status: AC

## 2016-11-09 MED ORDER — BOOST / RESOURCE BREEZE PO LIQD: 1.0000 | Freq: Three times a day (TID) | ORAL | 0 refills | Status: AC

## 2016-11-09 MED ORDER — LEVETIRACETAM 500 MG PO TABS: 500.0000 mg | ORAL_TABLET | Freq: Two times a day (BID) | ORAL | Status: AC

## 2016-11-09 MED ORDER — OXYCODONE-ACETAMINOPHEN 5-325 MG PO TABS: 1.0000 | ORAL_TABLET | Freq: Four times a day (QID) | ORAL | Status: DC | PRN

## 2016-11-09 MED ORDER — DOCUSATE SODIUM 100 MG PO CAPS: 100.0000 mg | ORAL_CAPSULE | Freq: Two times a day (BID) | ORAL | 0 refills | Status: AC

## 2016-11-09 MED ORDER — HYDROCODONE-ACETAMINOPHEN 5-325 MG PO TABS: ORAL_TABLET | ORAL | 0 refills | Status: AC

## 2016-11-09 NOTE — Progress Notes (Signed)
Pt discharged via non-emergent [facility] to [facility]. Report called to accepting RN. IV removed, pt on room air and in no distress. Nelli Swalley S Fenton, RN 

## 2016-11-09 NOTE — Discharge Summary (Signed)
[facility] [place] at [facility], 76 y.o., DOB 1941-06-03, MRN [id number]. 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 female who presents with Progressively worsening change in mental status. Patient was just in the ED day before for workup, without any clear etiology for her symptoms. She came back today for persistent worsening. Over the past 2-3 days she's had frequent falls, and now she's reached a point where she is responsive but confused and some of her speech, seems somewhat delirious. Patient had a CT scan of the head which was negative. Subsequently patient had a CTV which showed multiple enhancing lesions which appear metastatic given the history of breast cancer. Patient had an MRI which also showed meningeal involvement. She was seen by neuro and oncology. She was started on decadron and keppra. Patient mental status improved. She will start whole brain radiation.  Patient with a week and needs further rehabilitation   She will need palliative care team follow her at the facility        Consults  hematology/oncology  Significant Tests:  See full reports for all details     Dg Chest 1 View  Result Date: 11/03/2016 CLINICAL DATA:  Fall this morning. EXAM: CHEST 1 VIEW COMPARISON:  06/06/2016 FINDINGS: Right IJ Port-A-Cath unchanged. Lungs  are adequately inflated without consolidation, effusion or pneumothorax. Cardiomediastinal silhouette is normal. Calcified plaque over the thoracic aorta. Mild degenerate change of the spine. IMPRESSION: No active disease. Electronically Signed   By: Marin Olp M.D.   On: 11/03/2016 21:13   Dg Pelvis 1-2 Views  Result Date: 11/03/2016 CLINICAL DATA:  Fall this morning.  Right hip pain. EXAM: PELVIS - 1-2 VIEW COMPARISON:  11/10/2015 and 06/28/2009 FINDINGS: There is no evidence of pelvic fracture or diastasis. No pelvic bone lesions are seen. Degenerative change of the spine. IMPRESSION: No acute findings. Electronically Signed   By: Marin Olp M.D.   On: 11/03/2016 21:16   Ct Head Wo Contrast  Result Date: 11/04/2016 CLINICAL DATA:  76 y/o F; increased confusion today. Fall yesterday. History of breast cancer. EXAM: CT HEAD WITHOUT CONTRAST TECHNIQUE: Contiguous axial images were obtained from the base of the skull through the vertex without intravenous contrast. COMPARISON:  11/03/2016 CT of the head. FINDINGS: Brain: No evidence of acute infarction, hemorrhage, hydrocephalus, extra-axial collection or mass lesion/mass effect. Stable mild parenchymal volume loss. Vascular: Extensive calcific atherosclerosis of the cavernous and paraclinoid internal carotid arteries. Skull: Normal. Negative for fracture or focal lesion. Sinuses/Orbits: No acute finding. Other: Right intra-ocular lens replacement. IMPRESSION: 1. No acute intracranial abnormality identified. 2. Stable mild parenchymal volume loss. Electronically Signed   By: Kristine Garbe M.D.   On: 11/04/2016 22:30   Ct Head Wo Contrast  Result Date: 11/03/2016 CLINICAL DATA:  Fall this morning with generalized weakness 1 week. Right-sided headache. Breast cancer. EXAM:  CT HEAD WITHOUT CONTRAST TECHNIQUE: Contiguous axial images were obtained from the base of the skull through the vertex without intravenous contrast. COMPARISON:  10/16/2016  and 02/09/2015 FINDINGS: Brain: Ventricles, cisterns and other CSF spaces are within normal. No mass, mass effect, shift of midline structures or acute hemorrhage. No evidence of acute infarction. Minimal chronic ischemic microvascular disease. Vascular: Calcified atherosclerotic plaque over the cavernous segment of the internal carotid arteries. Skull: Within normal. Sinuses/Orbits: Within normal. Other: None. IMPRESSION: No acute intracranial findings. Mild chronic ischemic microvascular disease. Electronically Signed   By: Marin Olp M.D.   On: 11/03/2016 21:43   Ct Head Wo Contrast  Result Date: 10/16/2016 CLINICAL DATA:  Headache history of breast cancer EXAM: CT HEAD WITHOUT CONTRAST TECHNIQUE: Contiguous axial images were obtained from the base of the skull through the vertex without intravenous contrast. COMPARISON:  06/01/2015 FINDINGS: Brain: No acute territorial infarction, intracranial hemorrhage or extra-axial fluid collection is seen. There is no focal mass, mass effect or midline shift. There is mild cortical atrophy. The ventricles are non enlarged. Vascular: No hyperdense vessels.  Carotid artery calcifications. Skull: Normal. Negative for fracture or focal lesion. Sinuses/Orbits: No acute finding. Other: None IMPRESSION: No CT evidence for acute intracranial abnormality. Electronically Signed   By: Donavan Foil M.D.   On: 10/16/2016 23:27   Mr Jeri Cos SP Contrast  Result Date: 11/06/2016 CLINICAL DATA:  Worsening confusion and falls. History of breast cancer. EXAM: MRI HEAD WITHOUT AND WITH CONTRAST TECHNIQUE: Multiplanar, multiecho pulse sequences of the brain and surrounding structures were obtained without and with intravenous contrast. CONTRAST:  48mL MULTIHANCE GADOBENATE DIMEGLUMINE 529 MG/ML IV SOLN COMPARISON:  Noncontrast CT head 11/04/2016. Postcontrast CT head 11/05/2016. FINDINGS: Brain: Widespread intracranial metastatic lesions are seen involving both cerebellar  hemispheres, the brainstem, and both cerebral hemispheres. Index lesion LEFT lateral superior cerebellum measures 10 x 17 x 14 mm. Widespread metastatic lesions are seen throughout both cerebellar hemispheres, including the cerebellar tonsils. There is enhancement of the vermian cerebellar folia, consistent with leptomeningeal spread of disease. Index lesion in the brainstem measures 6 mm, RIGHT upper pons. Lower pontine, LEFT pontine, and midbrain lesions are also observed. No medullary metastases. Multiple supratentorial metastatic lesions are seen, the largest of which is 5 mm, LEFT frontal operculum. Other supratentorial areas involved include the LEFT temporal, RIGHT frontal, BILATERAL parietal lobes. Asymmetric enhancement of the subdural space, along the superior sagittal sinus to the RIGHT of midline, representing early pachymeningeal disease. No acute stroke or hemorrhage. Proteinaceous fluid layers in both ventricles. The ventricles are enlarged, including the temporal horns, consistent with mild communicating hydrocephalus. No extra-axial fluid. No significant midline shift. Vascular: Normal flow voids. Skull and upper cervical spine: Normal marrow signal. Cervical spondylosis. No pathologic compression fracture in the upper cervical region. Sinuses/Orbits: Negative. Other: None. IMPRESSION: Widespread intracranial metastatic disease as described involving BILATERAL cerebral hemispheres, brainstem, and BILATERAL cerebellar hemispheres. 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 A call has been placed to the ordering provider. Electronically Signed   By: Staci Righter M.D.   On: 11/06/2016 12:47   US Carotid Bilateral (at [facility] Only)  Result Date: 11/05/2016 CLINICAL DATA:  Weakness EXAM: BILATERAL CAROTID DUPLEX ULTRASOUND TECHNIQUE: Pearline Cables scale imaging, color Doppler and duplex ultrasound were performed of bilateral carotid and vertebral  arteries in the neck. COMPARISON:  None. FINDINGS: Criteria: Quantification of carotid stenosis is based on velocity parameters that correlate the residual internal carotid diameter  with NASCET-based stenosis levels, using the diameter of the distal internal carotid lumen as the denominator for stenosis measurement. The following velocity measurements were obtained: RIGHT ICA:  91 cm/sec CCA:  299 cm/sec SYSTOLIC ICA/CCA RATIO:  0.9 DIASTOLIC ICA/CCA RATIO:  2.1 ECA:  94 cm/sec LEFT ICA:  110 cm/sec CCA:  85 cm/sec SYSTOLIC ICA/CCA RATIO:  1.3 DIASTOLIC ICA/CCA RATIO:  1.9 ECA:  133 cm/sec RIGHT CAROTID ARTERY: Little if any plaque in the bulb. Low resistance internal carotid Doppler pattern. RIGHT VERTEBRAL ARTERY:  Antegrade. LEFT CAROTID ARTERY: Little if any plaque in the bulb. Low resistance internal carotid Doppler pattern. LEFT VERTEBRAL ARTERY:  Antegrade. IMPRESSION: Less than 50% stenosis in the right and left internal carotid arteries. Electronically Signed   By: Marybelle Killings M.D.   On: 11/05/2016 11:00   Dg Chest Port 1 View  Result Date: 11/04/2016 CLINICAL DATA:  Weakness and altered mental status. EXAM: PORTABLE CHEST 1 VIEW COMPARISON:  11/03/2016 FINDINGS: Right IJ Port-A-Cath unchanged. Lungs are adequately inflated without airspace consolidation or effusion. Cardiomediastinal silhouette is within normal. There is calcified plaque over the thoracic aorta. There are mild degenerative changes of the spine. IMPRESSION: No acute cardiopulmonary disease. Aortic atherosclerosis. Electronically Signed   By: Marin Olp M.D.   On: 11/04/2016 22:39   Ct Venogram Head  Result Date: 11/05/2016 CLINICAL DATA:  Chronic headaches. History of breast cancer. Stroke and diabetes. EXAM: CT VENOGRAM HEAD TECHNIQUE: Postcontrast imaging only was obtained through the brain with multiplanar reconstruction. CONTRAST:  24mL ISOVUE-300 IOPAMIDOL (ISOVUE-300) INJECTION 61% COMPARISON:  CT head without contrast  11/04/2016. MRI head 02/09/2015 FINDINGS: Multiple enhancing lesions are present in the brain. There was no hemorrhage in these areas on yesterday's unenhanced study. These are most likely due to metastatic disease. There is minimal edema. No significant mass-effect or midline shift. 3 mm lesion left medial parietal lobe image 46 5 mm left frontal lesion axial image 41 4 mm enhancing lesion right parietal lobe axial image 41 4 mm enhancing lesion left lower parietal lobe axial image 34 3 mm enhancing lesion left posterior temporal lobe axial image 25 7 mm enhancing lesion right pons axial image 15 5 mm enhancing lesion anterior pons axial image 12 14 mm lesion left lateral cerebellum axial image 14 9 mm lesion left mid cerebellum axial image 14 7 mm lesion right inferior medial cerebellum axial image 4 Negative for hydrocephalus.  Generalized atrophy. No skull lesions identified. Visualized paranasal sinuses normal. No orbital mass lesion. Normal enhancement of the dural venous sinuses. No evidence of clot or obstruction. IMPRESSION: 10 enhancing lesions in the brain with minimal edema. These are most consistent with metastatic disease. Consider MRI of the brain without and with contrast for further evaluation. Negative for dural venous sinus thrombosis. Electronically Signed   By: Franchot Gallo M.D.   On: 11/05/2016 16:13       Today   Subjective:   Erica Levy  had a headache earlier but other than that feels better  Objective:   Blood pressure (!) 141/56, pulse 60, temperature 97.6 F (36.4 C), temperature source Oral, resp. rate 20, height 5\' 2"  (1.575 m), weight 142 lb (64.4 kg), SpO2 99 %.  .  Intake/Output Summary (Last 24 hours) at 11/09/16 1210 Last data filed at 11/09/16 0345  Gross per 24 hour  Intake              240 ml  Output  300 ml  Net              -60 ml    Exam VITAL SIGNS: Blood pressure (!) 141/56, pulse 60, temperature 97.6 F (36.4 C), temperature  source Oral, resp. rate 20, height 5\' 2"  (1.575 m), weight 142 lb (64.4 kg), SpO2 99 %.  GENERAL:  76 y.o.-year-old patient lying in the bed with no acute distress.  EYES: Pupils equal, round, reactive to light and accommodation. No scleral icterus. Extraocular muscles intact.  HEENT: Head atraumatic, normocephalic. Oropharynx and nasopharynx clear.  NECK:  Supple, no jugular venous distention. No thyroid enlargement, no tenderness.  LUNGS: Normal breath sounds bilaterally, no wheezing, rales,rhonchi or crepitation. No use of accessory muscles of respiration.  CARDIOVASCULAR: S1, S2 normal. No murmurs, rubs, or gallops.  ABDOMEN: Soft, nontender, nondistended. Bowel sounds present. No organomegaly or mass.  EXTREMITIES: No pedal edema, cyanosis, or clubbing.  NEUROLOGIC: Cranial nerves II through XII are intact. Muscle strength 5/5 in all extremities. Sensation intact. Gait not checked.  PSYCHIATRIC: The patient is alert and oriented x 3.  SKIN: No obvious rash, lesion, or ulcer.   Data Review     CBC w Diff: Lab Results  Component Value Date   WBC 5.7 11/06/2016   HGB 13.2 11/06/2016   HCT 36.6 11/06/2016   PLT 173 11/06/2016   LYMPHOPCT 9 11/04/2016   MONOPCT 7 11/04/2016   EOSPCT 0 11/04/2016   BASOPCT 0 11/04/2016   CMP: Lab Results  Component Value Date   NA 138 11/08/2016   K 3.8 11/08/2016   K 3.7 11/13/2011   CL 105 11/08/2016   CO2 26 11/08/2016   BUN 23 (H) 11/08/2016   CREATININE 0.66 11/08/2016   PROT 6.5 11/04/2016   ALBUMIN 3.9 11/04/2016   BILITOT 1.5 (H) 11/04/2016   ALKPHOS 71 11/04/2016   AST 29 11/04/2016   ALT 34 11/04/2016  .  Micro Results No results found for this or any previous visit (from the past 240 hour(s)).      Code Status Orders        Start     Ordered   11/05/16 0318  Do not attempt resuscitation (DNR)  Continuous    Question Answer Comment  In the event of cardiac or respiratory ARREST Do not call a "code blue"   In the  event of cardiac or respiratory ARREST Do not perform Intubation, CPR, defibrillation or ACLS   In the event of cardiac or respiratory ARREST Use medication by any route, position, wound care, and other measures to relive pain and suffering. May use oxygen, suction and manual treatment of airway obstruction as needed for comfort.      11/05/16 0317    Code Status History    Date Active Date Inactive Code Status Order ID Comments User Context   06/20/2016  9:06 AM 06/22/2016  1:01 PM DNR [id number]  Demetrios Loll, MD Inpatient   06/17/2016  8:23 PM 06/20/2016  9:06 AM Full Code [id number]  Idelle Crouch, MD Inpatient   06/06/2016 12:18 PM 06/07/2016  5:40 PM Full Code [id number]  Lytle Butte, MD ED   01/13/2016  4:52 PM 01/14/2016  4:12 PM Full Code [id number]  Leonie Green, MD Inpatient   02/09/2015  2:48 PM 02/10/2015  8:32 PM Full Code [id number]  Dustin Flock, MD Inpatient          Follow-up Information    Lequita Asal, MD Follow up in 5 day(s).  Specialty:  Hematology and Oncology Contact information: [address] [place] [address] [phone]        Armstead Peaks., MD Follow up on 11/12/2016.   Specialty:  Radiation Oncology Contact information: [address]   [place]   [place] [address] [phone]           Discharge Medications   Allergies as of 11/09/2016   No Known Allergies     Medication List    STOP taking these medications   predniSONE 10 MG tablet Commonly known as:  DELTASONE     TAKE these medications   atorvastatin 80 MG tablet Commonly known as:  LIPITOR Take 80 mg by mouth daily at 6 PM.   dexamethasone 4 MG tablet Commonly known as:  DECADRON Take 1 tablet (4 mg total) by mouth 2 (two) times daily with a meal.   docusate sodium 100 MG capsule Commonly known as:  COLACE Take 1 capsule (100 mg total) by mouth 2 (two) times daily.   feeding supplement Liqd Take 1 Container by mouth 3 (three) times  daily between meals.   hydrochlorothiazide 12.5 MG capsule Commonly known as:  MICROZIDE Take 12.5 mg by mouth daily.   HYDROcodone-acetaminophen 5-325 MG tablet Commonly known as:  NORCO/VICODIN 1-2 tabs po qd prn for pain/headache What changed:  how much to take  how to take this  when to take this  reasons to take this  additional instructions   levETIRAcetam 500 MG tablet Commonly known as:  KEPPRA Take 1 tablet (500 mg total) by mouth 2 (two) times daily.   levothyroxine 50 MCG tablet Commonly known as:  SYNTHROID, LEVOTHROID Take 1 tablet by mouth daily.   nortriptyline 10 MG capsule Commonly known as:  PAMELOR Take 10 mg by mouth at bedtime.   nystatin 100000 UNIT/ML suspension Commonly known as:  MYCOSTATIN Use as directed 5 mLs (500,000 Units total) in the mouth or throat 4 (four) times daily.   ondansetron 4 MG disintegrating tablet Commonly known as:  ZOFRAN ODT Take 1 tablet (4 mg total) by mouth every 8 (eight) hours as needed for nausea or vomiting.   polyethylene glycol packet Commonly known as:  MIRALAX / GLYCOLAX Take 17 g by mouth daily. Start taking on:  11/10/2016   potassium chloride SA 20 MEQ tablet Commonly known as:  K-DUR,KLOR-CON Take 20 mEq by mouth 2 (two) times daily.   Vitamin D3 5000 units Caps Take 1 capsule by mouth every other day.          Total Time in preparing paper work, data evaluation and todays exam - 35 minutes  Dustin Flock M.D on 11/09/2016 at 12:10 PM  [facility]   Office  [phone]

## 2016-11-09 NOTE — Care Management Important Message (Signed)
Important Message  Patient Details  Name: Erica Levy MRN: [id number] Date of Birth: August 16, 1940   Medicare Important Message Given:  Yes    Shelbie Ammons, RN 11/09/2016, 8:17 AM

## 2016-11-09 NOTE — Discharge Instructions (Signed)
[facility] [place] at [facility] ° °DIET:  °Cardiac diet ° °DISCHARGE CONDITION:  °Stable ° °ACTIVITY:  °Activity as tolerated ° °OXYGEN:  °Home Oxygen: No. °  °Oxygen Delivery: room air ° °DISCHARGE LOCATION:  °nursing home  ° ° °ADDITIONAL DISCHARGE INSTRUCTION: ° ° °If you experience worsening of your admission symptoms, develop shortness of breath, life threatening emergency, suicidal or homicidal thoughts you must seek medical attention immediately by calling 911 or calling your MD immediately  if symptoms less severe. ° °You Must read complete instructions/literature along with all the possible adverse reactions/side effects for all the Medicines you take and that have been prescribed to you. Take any new Medicines after you have completely understood and accpet all the possible adverse reactions/side effects.  ° °Please note ° °You were cared for by a hospitalist during your hospital stay. If you have any questions about your discharge medications or the care you received while you were in the hospital after you are discharged, you can call the unit and asked to speak with the hospitalist on call if the hospitalist that took care of you is not available. Once you are discharged, your primary care physician will handle any further medical issues. Please note that NO REFILLS for any discharge medications will be authorized once you are discharged, as it is imperative that you return to your primary care physician (or establish a relationship with a primary care physician if you do not have one) for your aftercare needs so that they can reassess your need for medications and monitor your lab values. ° ° °

## 2016-11-09 NOTE — Clinical Social Work Note (Signed)
Pt ready for discharge today and will go to [facility]. Facility ready to admit pt as they have received discharge information. [employer] has given auth for discharge. RN called report. [facility] provided transportation. Pt and family were aware and agreeable to discharge plan. CSW is signing off as no further needs identified.   Darden Dates, MSW, LCSW  Clinical Social Worker [phone]

## 2016-11-12 ENCOUNTER — Ambulatory Visit: Payer: Medicare Other

## 2016-11-14 ENCOUNTER — Other Ambulatory Visit: Payer: Self-pay | Admitting: Surgery

## 2016-11-14 DIAGNOSIS — Z9012 Acquired absence of left breast and nipple: Secondary | ICD-10-CM

## 2016-11-26 ENCOUNTER — Ambulatory Visit: Payer: Medicare Other | Admitting: Radiation Oncology

## 2016-11-27 DEATH — deceased

## 2017-01-17 NOTE — Progress Notes (Deleted)
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  INTERVAL HISTORY: Patient returns to clinic today for routine 3 month follow-up. She has now completed her adjuvant XRT and tolerated it well without significant side effects. She has an intermittent headache, but denies any peripheral neuropathy or other neurologic complaints. She denies any recent fevers or illnesses. She has a good appetite and denies weight loss. She denies any pain. She denies any chest pain or shortness of breath. She denies any nausea, vomiting, constipation, or diarrhea. She has no urinary complaints. Patient offers no specific complaints today.  REVIEW OF SYSTEMS:   Review of Systems  Constitutional: Negative for fever, malaise/fatigue and weight loss.  Respiratory: Negative.  Negative for cough and shortness of breath.   Cardiovascular: Negative.  Negative for chest pain and leg swelling.  Gastrointestinal: Negative.  Negative for abdominal pain and nausea.  Genitourinary: Negative.   Musculoskeletal: Negative.   Neurological: Negative.  Negative for sensory change and weakness.  Psychiatric/Behavioral: Negative.  The patient is not nervous/anxious.    As per HPI. Otherwise, a complete review of systems is negative.   PAST MEDICAL HISTORY: Past Medical History:  Diagnosis Date  . Anemia   . Anemia due to chemotherapy   . Anxiety   . Cancer ([place])    breast  . Diabetes mellitus without complication ([place])    diet controlled  . Gallbladder calculus   . Generalized OA   . GERD (gastroesophageal reflux disease)   . Hypercholesteremia   . Hypertension   . Hypothyroid   . Migraine   .  Osteoporosis   . Stroke ([place])   . TIA (transient ischemic attack)    July    PAST SURGICAL HISTORY: Past Surgical History:  Procedure Laterality Date  . BREAST BIOPSY Left    bx/clip-neg  . BREAST BIOPSY Left 12/15/2015   path pending/us and stereo bx  . CATARACT EXTRACTION    . DILATION AND CURETTAGE OF UTERUS    . ENDOSCOPIC RETROGRADE CHOLANGIOPANCREATOGRAPHY (ERCP) WITH PROPOFOL N/A 06/19/2016   Procedure: ENDOSCOPIC RETROGRADE CHOLANGIOPANCREATOGRAPHY (ERCP) WITH PROPOFOL;  Surgeon: Lucilla Lame, MD;  Location: [facility] ENDOSCOPY;  Service: Endoscopy;  Laterality: N/A;  . EYE SURGERY Right    Cataract Extraction with IOL  . PARTIAL MASTECTOMY WITH AXILLARY SENTINEL LYMPH NODE BIOPSY Left 01/13/2016   Procedure: PARTIAL MASTECTOMY;  Surgeon: Leonie Green, MD;  Location: [facility] ORS;  Service: General;  Laterality: Left;  . PORTACATH PLACEMENT Right 01/13/2016   Procedure: INSERTION PORT-A-CATH;  Surgeon: Leonie Green, MD;  Location: [facility] ORS;  Service: General;  Laterality: Right;  . RETINAL DETACHMENT SURGERY Right    Dr. Starling Manns, [facility]  . TONSILLECTOMY      FAMILY HISTORY Family History  Problem Relation Age of Onset  . Hypertension Mother   . Osteoarthritis Mother   . Dementia Father   . Breast cancer Cousin 82       mat cousin       ADVANCED DIRECTIVES:    HEALTH MAINTENANCE: Social History  Substance Use Topics  . Smoking status: Never Smoker  . Smokeless tobacco: Never Used  . Alcohol use No     Colonoscopy:  PAP:  Bone density:  Lipid  panel:  No Known Allergies  Current Outpatient Prescriptions  Medication Sig Dispense Refill  . atorvastatin (LIPITOR) 80 MG tablet Take 80 mg by mouth daily at 6 PM.     . Cholecalciferol (VITAMIN D3) 5000 units CAPS Take 1 capsule by mouth every other day.    Marland Kitchen dexamethasone (DECADRON) 4 MG tablet Take 1 tablet (4 mg total) by mouth 2 (two) times daily with a meal. 30 tablet 0  . docusate sodium (COLACE)  100 MG capsule Take 1 capsule (100 mg total) by mouth 2 (two) times daily. 10 capsule 0  . feeding supplement (BOOST / RESOURCE BREEZE) LIQD Take 1 Container by mouth 3 (three) times daily between meals.  0  . hydrochlorothiazide (MICROZIDE) 12.5 MG capsule Take 12.5 mg by mouth daily.    Marland Kitchen HYDROcodone-acetaminophen (NORCO/VICODIN) 5-325 MG tablet 1-2 tabs po qd prn for pain/headache 6 tablet 0  . levETIRAcetam (KEPPRA) 500 MG tablet Take 1 tablet (500 mg total) by mouth 2 (two) times daily.    Marland Kitchen levothyroxine (SYNTHROID, LEVOTHROID) 50 MCG tablet Take 1 tablet by mouth daily.  0  . nortriptyline (PAMELOR) 10 MG capsule Take 10 mg by mouth at bedtime.  1  . nystatin (MYCOSTATIN) 100000 UNIT/ML suspension Use as directed 5 mLs (500,000 Units total) in the mouth or throat 4 (four) times daily. 60 mL 0  . ondansetron (ZOFRAN ODT) 4 MG disintegrating tablet Take 1 tablet (4 mg total) by mouth every 8 (eight) hours as needed for nausea or vomiting. 20 tablet 0  . polyethylene glycol (MIRALAX / GLYCOLAX) packet Take 17 g by mouth daily. 14 each 0  . potassium chloride SA (K-DUR,KLOR-CON) 20 MEQ tablet Take 20 mEq by mouth 2 (two) times daily.     No current facility-administered medications for this visit.     OBJECTIVE: There were no vitals filed for this visit.   There is no height or weight on file to calculate BMI.    ECOG FS:0 - Asymptomatic  General: Well-developed, well-nourished, no acute distress. Eyes: Pink conjunctiva, anicteric sclera. Breasts: Patient requested exam be deferred today. Lungs: Clear to auscultation bilaterally. Heart: Regular rate and rhythm. No rubs, murmurs, or gallops. Abdomen: Soft, nontender, nondistended. No organomegaly noted, normoactive bowel sounds. Musculoskeletal: No edema, cyanosis, or clubbing. Neuro: Alert, answering all questions appropriately. Cranial nerves grossly intact. Skin: No rashes or petechiae noted. Psych: Normal affect.   LAB  RESULTS:  Lab Results  Component Value Date   NA 138 11/08/2016   K 3.8 11/08/2016   CL 105 11/08/2016   CO2 26 11/08/2016   GLUCOSE 178 (H) 11/08/2016   BUN 23 (H) 11/08/2016   CREATININE 0.66 11/08/2016   CALCIUM 9.7 11/08/2016   PROT 6.5 11/04/2016   ALBUMIN 3.9 11/04/2016   AST 29 11/04/2016   ALT 34 11/04/2016   ALKPHOS 71 11/04/2016   BILITOT 1.5 (H) 11/04/2016   GFRNONAA >60 11/08/2016   GFRAA >60 11/08/2016    Lab Results  Component Value Date   WBC 5.7 11/06/2016   NEUTROABS 7.1 (H) 11/04/2016   HGB 13.2 11/06/2016   HCT 36.6 11/06/2016   MCV 87.6 11/06/2016   PLT 173 11/06/2016     STUDIES: No results found.  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  PLAN:    1. 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 She has now completed adjuvant XRT. An aromatase inhibitor would offer no benefit given the triple negative status of her disease. No further intervention is needed at this time. Return to clinic in 3 months for repeat labs and further evaluation.  2.  Hypertension: Patient's blood pressure is essentially within normal limits today. Continue current medications. 3.  Anemia: Secondary chemotherapy, monitor. Patient's hemoglobin has increased slightly today.  Monitor.  4.  Hypokalemia: Patient's potassium is now within normal limits, continue oral supplementation. 5.  Leukopenia: Mild, monitor 6.  Gallstones: Patient has not undergone cholecystectomy, but reports the plan is still to remove her gallbladder in the near future. She has preadmission testing on November 02, 2016.  7. Headache: Patient was referred to neurology by primary care for further evaluation.  Patient expressed understanding and was in  agreement with this plan. She also understands that She can call clinic at any time with any questions, concerns, or complaints.   Breast cancer of upper-inner quadrant of left female breast [facility])   Staging form: Breast, AJCC 7th Edition     Pathologic stage from 01/06/2016: Stage IIA (T1c, N1a, M0) - Signed by Lloyd Huger, MD on 01/06/2016   Lloyd Huger, MD 01/17/17 11:31 PM

## 2017-01-18 ENCOUNTER — Inpatient Hospital Stay: Payer: Medicare Other | Attending: Oncology

## 2017-01-18 ENCOUNTER — Inpatient Hospital Stay: Payer: Medicare Other | Admitting: Oncology

## 2017-02-02 ENCOUNTER — Other Ambulatory Visit: Payer: Self-pay | Admitting: Nurse Practitioner

## 2017-02-22 ENCOUNTER — Ambulatory Visit (INDEPENDENT_AMBULATORY_CARE_PROVIDER_SITE_OTHER): Payer: Self-pay | Admitting: Vascular Surgery

## 2017-02-22 ENCOUNTER — Encounter (INDEPENDENT_AMBULATORY_CARE_PROVIDER_SITE_OTHER): Payer: Self-pay

## 2017-12-13 IMAGING — CR DG C-ARM 1-60 MIN-NO REPORT
1 series · 1 of 1 positions shown · non-contrast
Comparison: none

[cont.]
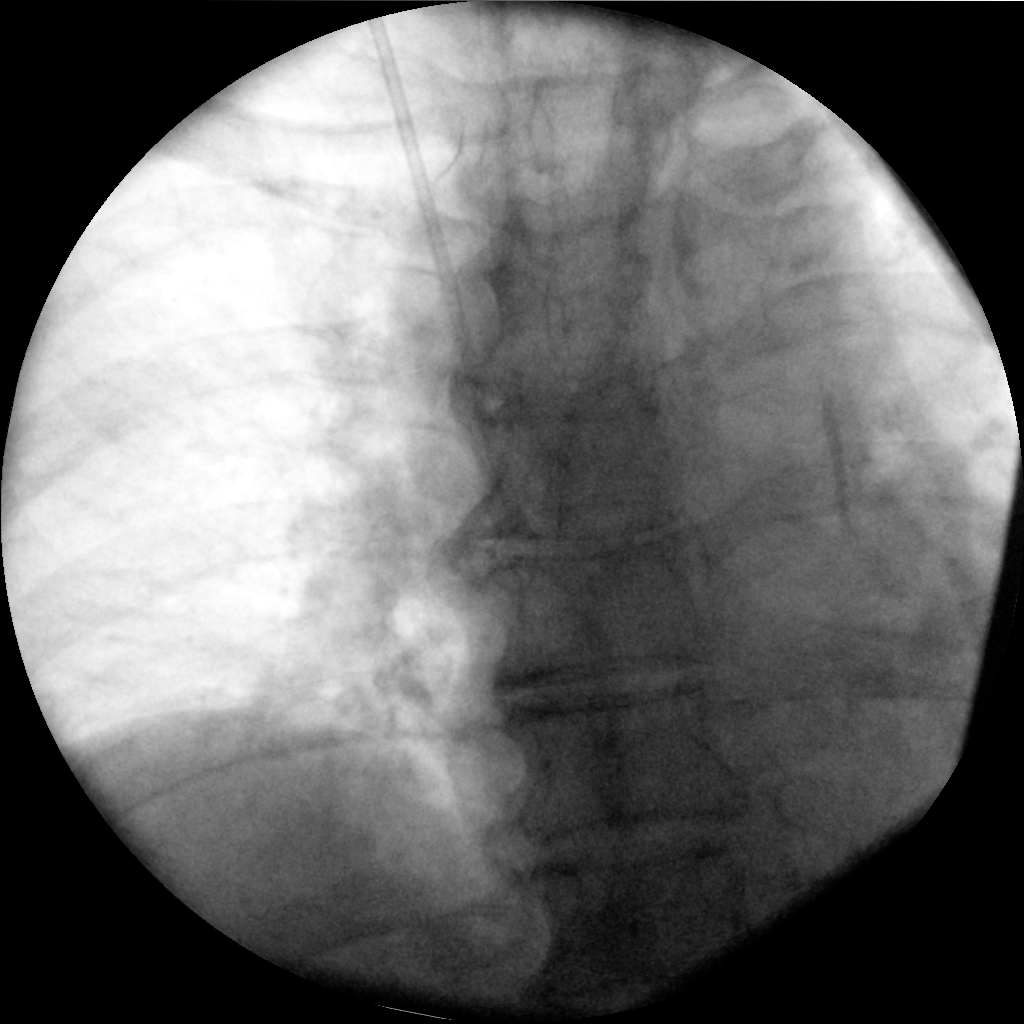

[1 of 1 positions shown; findings below may reference images not displayed]

Canned report from images found in remote index.

Refer to host system for actual result text.
